# Patient Record
Sex: Male | Born: 1955 | Race: Black or African American | Hispanic: No | Smoking: Former smoker
Health system: Southern US, Community
[De-identification: ages and names within clinical notes are randomized; demographics above are authoritative.]

## PROBLEM LIST (undated history)

## (undated) DIAGNOSIS — M542 Cervicalgia: Secondary | ICD-10-CM

## (undated) DIAGNOSIS — M545 Low back pain: Secondary | ICD-10-CM

## (undated) DIAGNOSIS — J439 Emphysema, unspecified: Secondary | ICD-10-CM

## (undated) DIAGNOSIS — I2510 Atherosclerotic heart disease of native coronary artery without angina pectoris: Secondary | ICD-10-CM

## (undated) DIAGNOSIS — F329 Major depressive disorder, single episode, unspecified: Secondary | ICD-10-CM

## (undated) DIAGNOSIS — I700 Atherosclerosis of aorta: Secondary | ICD-10-CM

## (undated) DIAGNOSIS — K219 Gastro-esophageal reflux disease without esophagitis: Secondary | ICD-10-CM

## (undated) DIAGNOSIS — F32A Depression, unspecified: Secondary | ICD-10-CM

## (undated) DIAGNOSIS — G8929 Other chronic pain: Secondary | ICD-10-CM

## (undated) DIAGNOSIS — M109 Gout, unspecified: Secondary | ICD-10-CM

## (undated) DIAGNOSIS — R911 Solitary pulmonary nodule: Secondary | ICD-10-CM

## (undated) DIAGNOSIS — Z86010 Personal history of colonic polyps: Secondary | ICD-10-CM

## (undated) DIAGNOSIS — S129XXA Fracture of neck, unspecified, initial encounter: Secondary | ICD-10-CM

## (undated) HISTORY — DX: Atherosclerosis of aorta: I70.0

## (undated) HISTORY — DX: Gastro-esophageal reflux disease without esophagitis: K21.9

## (undated) HISTORY — DX: Major depressive disorder, single episode, unspecified: F32.9

## (undated) HISTORY — DX: Emphysema, unspecified: J43.9

## (undated) HISTORY — PX: NECK SURGERY: SHX720

## (undated) HISTORY — DX: Fracture of neck, unspecified, initial encounter: S12.9XXA

## (undated) HISTORY — DX: Solitary pulmonary nodule: R91.1

## (undated) HISTORY — DX: Atherosclerotic heart disease of native coronary artery without angina pectoris: I25.10

## (undated) HISTORY — DX: Depression, unspecified: F32.A

## (undated) HISTORY — DX: Cervicalgia: M54.2

## (undated) HISTORY — DX: Gout, unspecified: M10.9

## (undated) HISTORY — DX: Personal history of colonic polyps: Z86.010

## (undated) HISTORY — DX: Other chronic pain: G89.29

## (undated) HISTORY — DX: Low back pain: M54.5

---
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
## 1998-06-14 ENCOUNTER — Encounter: Admission: RE | Admit: 1998-06-14 | Discharge: 1998-09-01 | Payer: Self-pay | Admitting: *Deleted

## 1998-06-28 ENCOUNTER — Ambulatory Visit (HOSPITAL_COMMUNITY): Admission: RE | Admit: 1998-06-28 | Discharge: 1998-06-28 | Payer: Self-pay | Admitting: Gastroenterology

## 1999-04-04 ENCOUNTER — Encounter: Admission: RE | Admit: 1999-04-04 | Discharge: 1999-04-13 | Payer: Self-pay | Admitting: *Deleted

## 2003-02-06 DIAGNOSIS — G8929 Other chronic pain: Secondary | ICD-10-CM

## 2003-02-06 DIAGNOSIS — M545 Low back pain, unspecified: Secondary | ICD-10-CM

## 2003-02-06 DIAGNOSIS — S129XXA Fracture of neck, unspecified, initial encounter: Secondary | ICD-10-CM

## 2003-02-06 HISTORY — DX: Fracture of neck, unspecified, initial encounter: S12.9XXA

## 2003-02-06 HISTORY — PX: LUMBAR FUSION: SHX111

## 2003-02-06 HISTORY — DX: Other chronic pain: G89.29

## 2003-02-06 HISTORY — DX: Low back pain, unspecified: M54.50

## 2003-06-13 ENCOUNTER — Emergency Department (HOSPITAL_COMMUNITY): Admission: EM | Admit: 2003-06-13 | Discharge: 2003-06-13 | Payer: Self-pay | Admitting: Emergency Medicine

## 2003-07-01 ENCOUNTER — Inpatient Hospital Stay (HOSPITAL_COMMUNITY): Admission: EM | Admit: 2003-07-01 | Discharge: 2003-07-06 | Payer: Self-pay

## 2003-07-07 ENCOUNTER — Emergency Department (HOSPITAL_COMMUNITY): Admission: EM | Admit: 2003-07-07 | Discharge: 2003-07-07 | Payer: Self-pay | Admitting: Emergency Medicine

## 2003-07-28 ENCOUNTER — Emergency Department (HOSPITAL_COMMUNITY): Admission: EM | Admit: 2003-07-28 | Discharge: 2003-07-28 | Payer: Self-pay | Admitting: Emergency Medicine

## 2003-08-19 ENCOUNTER — Inpatient Hospital Stay (HOSPITAL_COMMUNITY): Admission: RE | Admit: 2003-08-19 | Discharge: 2003-08-24 | Payer: Self-pay | Admitting: Neurosurgery

## 2003-09-22 ENCOUNTER — Emergency Department (HOSPITAL_COMMUNITY): Admission: EM | Admit: 2003-09-22 | Discharge: 2003-09-22 | Payer: Self-pay | Admitting: Emergency Medicine

## 2004-01-19 ENCOUNTER — Ambulatory Visit: Payer: Self-pay | Admitting: Internal Medicine

## 2004-08-24 ENCOUNTER — Inpatient Hospital Stay (HOSPITAL_COMMUNITY): Admission: RE | Admit: 2004-08-24 | Discharge: 2004-08-26 | Payer: Self-pay | Admitting: Neurosurgery
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
## 2005-07-03 ENCOUNTER — Ambulatory Visit (HOSPITAL_COMMUNITY): Admission: RE | Admit: 2005-07-03 | Discharge: 2005-07-03 | Payer: Self-pay | Admitting: Neurosurgery

## 2005-09-03 ENCOUNTER — Inpatient Hospital Stay (HOSPITAL_COMMUNITY): Admission: RE | Admit: 2005-09-03 | Discharge: 2005-09-11 | Payer: Self-pay | Admitting: Neurosurgery

## 2006-11-20 ENCOUNTER — Encounter
Admission: RE | Admit: 2006-11-20 | Discharge: 2007-02-18 | Payer: Self-pay | Admitting: Physical Medicine & Rehabilitation

## 2006-11-20 ENCOUNTER — Ambulatory Visit: Payer: Self-pay | Admitting: Physical Medicine & Rehabilitation

## 2006-12-25 ENCOUNTER — Ambulatory Visit: Payer: Self-pay | Admitting: Physical Medicine & Rehabilitation

## 2006-12-27 ENCOUNTER — Encounter
Admission: RE | Admit: 2006-12-27 | Discharge: 2007-02-05 | Payer: Self-pay | Admitting: Physical Medicine & Rehabilitation

## 2007-01-15 ENCOUNTER — Ambulatory Visit: Payer: Self-pay | Admitting: Physical Medicine & Rehabilitation

## 2007-01-20 ENCOUNTER — Encounter: Admission: RE | Admit: 2007-01-20 | Discharge: 2007-04-20 | Payer: Self-pay | Admitting: Anesthesiology

## 2007-01-21 ENCOUNTER — Ambulatory Visit: Payer: Self-pay | Admitting: Anesthesiology

## 2007-02-14 ENCOUNTER — Encounter
Admission: RE | Admit: 2007-02-14 | Discharge: 2007-03-17 | Payer: Self-pay | Admitting: Physical Medicine & Rehabilitation

## 2007-02-26 ENCOUNTER — Encounter
Admission: RE | Admit: 2007-02-26 | Discharge: 2007-05-27 | Payer: Self-pay | Admitting: Physical Medicine & Rehabilitation

## 2007-02-26 ENCOUNTER — Ambulatory Visit: Payer: Self-pay | Admitting: Physical Medicine & Rehabilitation

## 2007-04-09 ENCOUNTER — Ambulatory Visit: Payer: Self-pay | Admitting: Psychology

## 2008-02-06 DIAGNOSIS — Z860100 Personal history of colon polyps, unspecified: Secondary | ICD-10-CM

## 2008-02-06 HISTORY — DX: Personal history of colonic polyps: Z86.010

## 2008-02-06 HISTORY — DX: Personal history of colon polyps, unspecified: Z86.0100

## 2008-04-05 HISTORY — PX: COLONOSCOPY: SHX174

## 2009-11-02 ENCOUNTER — Encounter: Admission: RE | Admit: 2009-11-02 | Discharge: 2009-11-02 | Payer: Self-pay | Admitting: Orthopedic Surgery

## 2010-06-20 NOTE — Procedures (Signed)
NAME:  Rodney Charles, Rodney Charles NO.:  [id number]   MEDICAL RECORD NO.:  [id number]          PATIENT TYPE:  REC   LOCATION:  [facility]                          FACILITY:  [facility]   PHYSICIAN:  Celene Kras, MD        DATE OF BIRTH:  [birth year]   DATE OF PROCEDURE:  DATE OF DISCHARGE:                               OPERATIVE REPORT   Rodney Charles comes in for pain management today.  __________  14 point  review of systems.   1. Rodney Charles has extensive hardware, added biomechanical stress above and      below surgical fixation site, described as a left lateralizing      pain, suprascapular, levator scapular, myofascial extension.      Decreased range of motion and quality of life indices, he does not      want to escalate controlled substances and wants to improve      function.  We have injected him, he has done well with cervical      facet medial branch intervention with attenuated symptoms, and      recrudescence.  He had about a 48-hour total relief cycling, and      today we will go ahead and sequentially inject him.  We will look      for a biphasic response to local anesthetic and possibly steroid,      and I have reviewed this with him.  I have used models and      discussed in lay terms.  2. He will assess this.  We may sequentially go on to Marcaine, but I      am going to stick with lidocaine today, as he had a little bit of      dizziness with last intervention, and then follow him expectantly.      I plan C3, C4, C5 and C6 contributory innervation addressed under      local anesthetic.  Predicate further intervention based on need and      overall response, left side, independent needle access points at      the medial branch.  3. Home-based therapy.  4. Low back symptoms are stable.  5. No advancing neurological features.  Do not think further imaging      or diagnostics are warranted.  He will maintain contact with Dr.      Wynn Charles office.   Objectively, diffuse  paracervical, suprascapular, and myofascial,  positive  compression test, left greater than right.  This is his  typical pain mechanical over diskogenic with pain side bending, pain  with extension and flexion.  Rotational impairment.  Nothing new  neurologically.   IMPRESSION:  Cervical facet syndrome __________  cervical spine.   PLAN:  Cervical facet medial branch intervention, C3, C4, C5 and C6 with  contributory innervation addressed to the left side under local  anesthetic, and he is consented.  Predicate further intervention based  on need and overall response.  Questions are answered and discussed in  lay terms.  Follow up with him and determine further course of care.   The patient  taken to fluoroscopy suite and placed in supine position and  prepped and draped in usual fashion.  Prepped and draped the neck in  usual fashion.  Under local anesthetic, I advanced a 25-gauge spinal  needle to the facet of the medial branch of C3, C4, C5 and C6 with  contributory innervation addressed at independent needle access points.  I confirmed placement.  I then test uneventfully and followed 0.5 mL  lidocaine and 1% MPF at each level, with a total of 40 mg Aristocort in  divided dose.   Tolerated procedure well.  No complications from our procedure.  Improved at discharge.  Predicate further intervention based on need.  At some point, sequential intervention with denser local anesthetic, and  would consider  RF down the road.  I do not think it is necessary at this time.  We want  to see a real good response here, and follow him expectantly.           ______________________________  Celene Kras, MD     HH/MEDQ  D:  02/25/2007 12:36:57  T:  02/25/2007 17:28:33  Job:  [id number]
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
## 2010-06-20 NOTE — Assessment & Plan Note (Signed)
Mr. Otting returns today.  I last saw him January 17, 2007.  He  underwent L1, L2, L3 medial branch blocks under fluoroscopic guidance.  The first set of injections helped him 40-50%, this last one about 20%  improvement.  In the interval time he did see Dr. Stevphen Rochester, but was not  able to do the injection secondary to mix up in Workers' Comp approval  process.  Other injection February 25, 2007.  He has had no new medical  complications in the interval time period.   His medications include hydrocodone 5/500 one p.o. b.i.d.  He still has  some gabapentin 300 mg q.i.d. from Dr. Phoebe Perch and he continues to take  that.  He also has been taking Flexeril 10 mg t.i.d.  His other meds  include Nexium and Flomax.   EXAMINATION:  No acute distress.  When he moves, he is very apprehensive  about moving.  He grimaces a lot, even with very slight movements.  He  complains of back pain when I do a hip range of motion.  He complains of  neck pain when I examine his arms.  He has full range of motion in upper  and lower extremities.  He has normal strength bilateral upper and lower  extremities.  His neck range of motion is extremely limited, 25%  flexion/extension, essentially 0 lateral rotation and bending.  He does  not turn the head either.  He pushes up his chin intermittently with his  hand.  His gait shows no evidence of toe drag or knee instability.  No  coordination problems in the upper or lower extremities.   IMPRESSION:  1. Chronic postoperative neck pain.  He is status post anterior      cervical diskectomy and fusion C4-5, 5-6, 6-7.  2. Lumbar postlaminectomy syndrome with chronic postoperative pain      underwent L4-5, L5-S1 fusion.  3. Back pain and buttocks pain.  Given no significant response to      lumbar medial branch blocks, we will pursue sacroiliac injections.  4. Cervical medial branch block is already scheduled per Dr. Stevphen Rochester.  5. Continue current medication, hydrocodone  5/500 one p.o. b.i.d.  6. Continue treatment from psychology, Dr. Leonides Cave, for depression and      anxiety.   I will see him back for the SI injection.   ADDENDUM:  I spoke with Omelia Blackwater, case manager.  Discussed treatment  plans with her and the patient, all in agreement with above.  Will also  give samples of Flexor Patch b.i.d.  If helpful, the patient will call  in for a prescription.  I have also written for Flexeril 10 mg, number  90, but he has been instructed to really take this only as needed and  not necessarily on a daily basis.      Erick Colace, M.D.  Electronically Signed     AEK/MedQ  D:  02/17/2007 15:32:21  T:  02/18/2007 09:08:04  Job #:  [id number]   cc:   Clydene Fake, M.D.  Fax: [phone]   Omelia Blackwater  Fax [phone]
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
## 2010-06-20 NOTE — Procedures (Signed)
NAME:  Rodney Charles, Rodney Charles               ACCOUNT NO.:  [id number]   MEDICAL RECORD NO.:  [id number]          PATIENT TYPE:  REC   LOCATION:  [facility]                         FACILITY:  [facility]   PHYSICIAN:  Erick Colace, M.D.DATE OF BIRTH:  04-12-1955   DATE OF PROCEDURE:  DATE OF DISCHARGE:                               OPERATIVE REPORT   PROCEDURE:  Bilateral L1 medial branch block, bilateral L2 medial branch  block, bilateral L3 medial branch under block fluoroscopic guidance.   INDICATIONS:  Lumbar facet mediated pain above of fusion at L4-5, L5-S1  level.   Pain is only partially responsive to medication management including  narcotic analgesics as other conservative care as well.  Pain interferes  with bathing, household duties, shopping, dressing and working.   Informed consent was obtained after describing risks and benefits of the  procedure to the patient.  These include bleeding, bruising, infection,  temporary or permanent paralysis.  He elects to proceed and has given  written consent.  The patient placed prone on fluoroscopy table.  Betadine prep, sterile drape, 25-gauge 1-1/2 inch needle was used to  anesthetize skin and subcu tissue 1% lidocaine x2 mL.  Then a 22 gauge 3-  1/2 inch spinal needle was inserted first targeting left left L4 SAP  transverse process junction, bone contact made confirmed with lateral  imaging.  Omnipaque 180 x 0.5 mL demonstrated no intravascular uptake,  then solution containing 9.5 mL of solution containing 1 mL of 4 mg per  mL dexamethasone 2 mL of 2% MPF lidocaine were injected.  The left L3  SAP transverse process junction targeted bone contact confirmed made  with lateral imaging.  Omnipaque 180 x 0.5 mL demonstrated no  intravascular uptake and 0.5 mL of dexamethasone lidocaine solution  injected.  The left L2 SAP transverse junction targeted bone contact  made confirmed with lateral imaging.  Omnipaque 180 x 0.5 mL  demonstrated no  intravascular uptake and 0.5 mL of dexamethasone  lidocaine solution was injected.  The same procedure was repeated on the  right side at corresponding levels using same injectate same procedure  and same technique.  The patient tolerated procedure well.  Pre post  injection vitals stable.  Post injection instructions given.  Return in  10 to 14 days for recheck, see how he did with this and decide whether  not to pursue radiofrequency neurotomy.      Erick Colace, M.D.  Electronically Signed     AEK/MEDQ  D:  01/16/2007 13:10:45  T:  01/17/2007 08:01:12  Job:  [id number]

## 2010-06-20 NOTE — Assessment & Plan Note (Signed)
The patient is here for a sacroiliac injection but has other questions  that he wants to discuss.  He states that he has had a decline in sexual  interest as well as difficulty with having intercourse due to pain in  his back.  He has been speaking with Dr. Leonides Cave from Psychology, who  suggested I might be able to discuss some other positions that might  more comfortable for the patient and to address his other decreased  libido issue.   He is known to have no history of diabetes.  He has been on Flomax for  prostatic hypertrophy.   CURRENT MEDICATIONS:  1. Hydrocodone 5/500 b.i.d.  2. Flexeril 10 mg q.8h.   Sleep is poor.  Pain interferes with activity at only a 2 out of 10  level.  Average pain is 10 out of 10.  Can walk 20 minutes at a time,  climb steps, and drive.   REVIEW OF SYSTEMS:  See 14-point review on Health and History form.  Pertinent positives are numbness, tremor, tingling, spasms, depression,  and anxiety.   PHYSICAL EXAMINATION:  GENERAL:  In no acute distress, mood and affect  are appropriate.  VITAL SIGNS:  Blood pressure is 125/95, pulse is 61, respiratory rate is  18, O2 SAT 99% room air.  PAIN AND REHAB EVALUATION:  His gait is normal without toe drag or knee  instability.  Mood and affect are appropriate.   I suggested to the patient that he try the bottom position with his  significant other on top.  This would reduce the pain related to  lumbosacral motion.  I also indicated that narcotic analgesics can  reduce testosterone level especially if taken on a chronic basis and  that the best way to reduce that would be to reduce his medication  usage.   He also handed me some disability forms that he wants me to complete as  well as a permanent handicap parking sticker.  I stated that I do not  think he needs a permanent sticker and that I would be willing to give  him a temporary one x1 time only.  As I stated before, I felt the  previous restrictions  noted on his FCE indicating a  standing tolerance  of one to two hours and walking tolerance of two to three hours in an  eight hour day with an overall sedentary select duty position is  appropriate, and I will fill out his forms keeping this in mind.  I will  see him back in two weeks.  We will discuss his treatment including  injections from Dr. Stevphen Rochester as well as the injections I have done and  determine whether he is at MMI from a pain management standpoint.      Erick Colace, M.D.  Electronically Signed     AEK/MedQ  D:  02/27/2007 17:25:16  T:  02/27/2007 23:12:06  Job #:  [id number]

## 2010-06-20 NOTE — Procedures (Signed)
NAME:  Rodney Charles, Rodney Charles               ACCOUNT NO.:  [id number]   MEDICAL RECORD NO.:  [id number]          PATIENT TYPE:  REC   LOCATION:  [facility]                          FACILITY:  [facility]   PHYSICIAN:  Erick Colace, M.D.DATE OF BIRTH:  Jun 09, 1955   DATE OF PROCEDURE:  02/27/2007  DATE OF DISCHARGE:                               OPERATIVE REPORT   PROCEDURE:  Bilateral sacroiliac injection under fluoroscopic guidance.   INDICATION:  Lumbar pain, status post L4-5, L5-S1 fusion not responsive  to lumbar medial branch blocks.  He has pain in the low back and buttock  area as well.   Pain is only partially responsive to medication management.   MEDICATIONS:  1. Hydrocodone 5/5.01 p.o. b.i.d.  2. Flexeril time q.8h. p.r.n.   Informed consent was obtained after describing risks and benefits of the  procedure to the patient.  These include bleeding, bruising, infection,  loss of bowel or bladder function, temporary or permanent paralysis.  He  elects to proceed with has given written consent.   The patient was placed prone on the fluoroscopy table.  Betadine prep,  sterile drape, a 25-gauge inch and a half needle was used to anesthetize  skin and subcu tissue 1% lidocaine x2 mL.  Then a 25-gauge three inch  spinal needle was inserted first in the left SI joint. AP, lateral and  oblique imaging utilized.  Omnipaque 20 x0.5 mL demonstrated no  intravascular uptake.  Then a solution containing 0.5 mL of 40 mg/mL  Depo-Medrol and 1 mL of 2% MPF lidocaine were injected.  The same  procedure was repeated on the right side at corresponding level with  same needle, injectate technique.  The patient tolerated procedure well.  Pre- and post injection vitals stable.  Pre-injection pain level 10/10,  post injection 7/10.  I will see him back in two weeks for follow-up  visit and go over his response to treatment thus far.      Erick Colace, M.D.  Electronically Signed     AEK/MEDQ   D:  02/27/2007 17:19:52  T:  02/28/2007 08:28:36  Job:  [id number]   cc:   Clydene Fake, M.D.  Fax: [phone]   Nestor Ramp FAX [phone], Med. Nurse Case Man.

## 2010-06-20 NOTE — Assessment & Plan Note (Signed)
ADDENDUM:  I put a procedure down as cervical facet medial branch  intervention.  I did not do that procedure.  Just prior to starting to  the procedure, we found he was not preapproved by [employer] and  withheld and reviewed his medication.  I talked to Dr. Wynn Banker.  We  will see him in follow up.  Cervical facet medial branch intervention in  follow up.           ______________________________  Celene Kras, MD     HH/MedQ  D:  01/21/2007 14:29:53  T:  01/22/2007 06:09:26  Job #:  [id number]

## 2010-06-20 NOTE — Procedures (Signed)
Rodney Charles, Rodney Charles               ACCOUNT NO.:  [id number]   MEDICAL RECORD NO.:  [id number]          PATIENT TYPE:  REC   LOCATION:  [facility]                          FACILITY:  [facility]   PHYSICIAN:  Erick Colace, M.D.DATE OF BIRTH:  1955-09-24   DATE OF PROCEDURE:  12/26/2006  DATE OF DISCHARGE:                               OPERATIVE REPORT   This is bilateral L5 dorsal ramus injection, bilateral L4 medial branch  block, bilateral L3 medial branch block under fluoroscopic guidance.   INDICATIONS:  Lumbar facet mediated pain only partially responsive to  medication management.  He has had L4-S1 fusion and has pain above the  level of the fusion.  Axial back pain above the level.   He been trialed on Flexeril, Arthrotec, gabapentin, as well as tramadol  and has gone through physical therapy.   Pain is inhibiting from functional activities and including dressing  bathing.   Informed consent was obtained after describing risks and benefits of the  procedure to the patient.  These include bleeding, bruising, infection,  temporary or permanent paralysis.  He has elected to proceed and has  given written consent.  The patient placed prone on fluoroscopy table.  Betadine prepped and sterile drape.  25-gauge 1-1/2 inch needle was used  to anesthetize skin and subcu tissue with 1% lidocaine x2 ccs and 22-  gauge 3-1/2 inch spinal needle was inserted under fluoroscopic guidance  first starting the left L4 SAP transverse junction targeted junction.  Bone contact made, confirmed with lateral imaging.  Bone contact made  confirmed with lateral imaging.  Omnipaque 180 x 0.5 mL demonstrated no  intravascular uptake and 0.5 mL of solution containing 1 mL of 4 mg per  mL dexamethasone and 2 mL of 2% MPF lidocaine.  Then left L3 SAP  transverse process junction targeted, bone contact made.  Omnipaque 180  x 0.5 mL demonstrated no intravascular uptake and 0.5 mL of  dexamethasone lidocaine  solution was injected.  Then the left L2 SAP  transverse process junction targeted.  Bone contact made, confirmed with  lateral imaging.  Omnipaque 180 x 0.5 mL demonstrated no intravascular  uptake and 0.5 mL of dexamethasone lidocaine solution was injected.  Then the right side injections were performed at the corresponding  levels using the same equipment injectate and technique.  The patient  tolerated procedure well.  Pre and post injection vitals stable.  Pre  injection pain level 10/10, post injection 9/10.  We will give him a  pain diary and have him come back in three weeks.  If he did not get the  least a 50% pain relief, would do sacroiliac injections.      Erick Colace, M.D.  Electronically Signed     AEK/MEDQ  D:  12/26/2006 14:59:55  T:  12/27/2006 07:12:55  Job:  [id number]

## 2010-06-23 NOTE — Discharge Summary (Signed)
NAME:  Rodney Charles, Rodney Charles               ACCOUNT NO.:  [id number]   MEDICAL RECORD NO.:  [id number]          PATIENT TYPE:  INP   LOCATION:  3016                         FACILITY:  [facility]   PHYSICIAN:  Clydene Fake, M.D.  DATE OF BIRTH:  1955-02-18   DATE OF ADMISSION:  09/03/2005  DATE OF DISCHARGE:  09/11/2005                                 DISCHARGE SUMMARY   DATE OF ADMISSION:  September 03, 2005   DATE OF DISCHARGE:  September 11, 2005   DIAGNOSIS:  Lumbar stenosis, spinal stenosis, degenerative disk disease,  herniated nucleus pulposus L4-5 and L5-S1.   DISCHARGE DIAGNOSIS:  Lumbar stenosis, spinal stenosis, degenerative disk  disease, herniated nucleus pulposus L4-5 and L5-S1.   PROCEDURE:  Posterior lumbar interbody fusion L4-5 and L5-S1, Saber  interbody cages, Expedium pedicle screw fixation, posterolateral fusion,  autografting Infuse BMP.   REASON FOR ADMISSION:  The patient is a 55 year old gentleman who has had  back and leg pain.  __________ MRI, x-rays, and myelogram.  The patient had  degenerative changes, disk herniation, and root compression.  The patient  brought in for decompression and infusion.   HOSPITAL COURSE:  The patient admitted the day of surgery and underwent the  procedure above without complication.  Postop the patient was transferred to  the recovery room and then to the floor.  He was moving his legs well while  postop had much less leg pain, was up ambulating.  He did have some  significant incisional pain.  The patient had some slight drainage on the  dressing and we started to cover him with p.o. Cipro the second postop day  as his IV antibiotics were stopped.  He was also intermittent febrile  through these first few days.  By September 07, 2005, he had a temperature of  102.8, white count was just up a little bit at 12.8.  Incision was  indurated, warm, tender, and had some drainage that did look purulent.  It  was decided to take the patient back to  the operating room for I&D wound and  on September 07, 2005, the patient had I&D of lumbar wound.  Gram stain was  negative.  Surgery went well and he was transferred to recovery room and  back to the floor.  We did dressing changes for his drainage which stopped  and covered him with IV antibiotics with vancomycin from the time of I&D of  the wound, and cultures were negative through his hospital stay.  By September 11, 2005, he had no drainage, incision looked good, cultures were all  negative.  It was thought it was just a stroma reaction rather than  infection.  We did cover him with p.o. Cipro and sent him home on September 11, 2005, in stable condition with Percocet p.r.n., Flexeril p.r.n., Cipro for  the next 10 days and will follow up in 1 week in our office for wound check  and suture removal.           ______________________________  Clydene Fake, M.D.     JRH/MEDQ  D:  10/25/2005  T:  10/25/2005  Job:  [id number]
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
## 2010-06-23 NOTE — Op Note (Signed)
NAMEMarland Charles  SANJIV, CASTORENA NO.:  [id number]   MEDICAL RECORD NO.:  [id number]          PATIENT TYPE:  INP   LOCATION:  3016                         FACILITY:  [facility]   PHYSICIAN:  Clydene Fake, M.D.  DATE OF BIRTH:  1955-07-18   DATE OF PROCEDURE:  09/07/2005  DATE OF DISCHARGE:                                 OPERATIVE REPORT   DIAGNOSIS:  Possible wound infection, lumbar wound.   POSTOPERATIVE DIAGNOSES:  1.  Possible wound infection, lumbar wound.  2.  Subcutaneous hematoma.   SURGEON:  Clydene Fake, M.D.   ANESTHESIA:  General endotracheal tube anesthesia.   ESTIMATED BLOOD LOSS:  Minimal.   BLOOD GIVEN:  None.   DRAINS:  None.   COMPLICATIONS:  None.   CULTURES SENT:  Gram stain was negative for organisms.   REASON FOR PROCEDURE:  The patient is a 55 year old gentleman who 4 days ago  underwent interbody spinal fusion with screw instrumentation and interbody  cage at L4-5 and 5-1.  He has had some increasing wound drainage, increased  back pain, some induration and warmth around the incision, spiking  temperatures up to 102.8 and white count was high, and it was elected to  return the patient to operating room for I&D of wound for possible  infection.   PROCEDURE IN DETAIL:  The patient was brought to the operating room, and  general anesthesia was induced.  The patient was placed in the prone  position on the Wilson frame.  All pressure points were padded.  The patient  was prepped and draped in the usual sterile fashion, and the site of the  previous incision was opened with a 15 blade.  Immediately serosanguineous  fluid was found along with some clumped hematoma in the subcutaneous space.  Cultures for aerobe, anaerobe, and Gram stain were sent, and hematoma fluid  evacuated.  Explored the __________ , and there was no purulent material.  It actually looked very good.  The fascial suture line looked good.  We  pulse evac'd with 3 liters of  vancomycin.  We then again explored the area.  We explored the fascial suture line.  Some fluid did come up from there, so  we opened up the middle half of the fascial suture line to explore the  epidural space.  Hematoma over the dura was seen along with some  serosanguineous fluid.  This was all evacuated, and we saw the dura, and  with Valsalva no further fluid build-up came.  No sign of CSF leak.  We  irrigated with bacitracin irrigation.  No sign of purulent material in this  area, and then the fascia was reclosed with 0-Vicryl interrupted  suture.  The subcutaneous tissue was closed with just two 0-Vicryl  interrupted sutures, bringing the incision together and the skin closed with  2-0 nylon running suture.  Dressing was placed.  The patient was placed back  in the supine position, awoken from anesthesia, and returned to the recovery  room in stable condition.           ______________________________  Dola Argyle  Phoebe Perch, M.D.     JRH/MEDQ  D:  09/07/2005  T:  09/08/2005  Job:  [id number]

## 2010-09-14 ENCOUNTER — Emergency Department (HOSPITAL_COMMUNITY)
Admission: EM | Admit: 2010-09-14 | Discharge: 2010-09-14 | Disposition: A | Discharge status: Home / Self Care | Payer: Medicare Other | Attending: Emergency Medicine | Admitting: Emergency Medicine

## 2010-09-14 ENCOUNTER — Encounter (HOSPITAL_COMMUNITY): Payer: Self-pay | Admitting: Radiology

## 2010-09-14 ENCOUNTER — Emergency Department (HOSPITAL_COMMUNITY): Payer: Medicare Other

## 2010-09-14 DIAGNOSIS — M546 Pain in thoracic spine: Secondary | ICD-10-CM | POA: Insufficient documentation

## 2010-09-14 DIAGNOSIS — G8929 Other chronic pain: Secondary | ICD-10-CM | POA: Insufficient documentation

## 2010-09-14 DIAGNOSIS — R0789 Other chest pain: Secondary | ICD-10-CM | POA: Insufficient documentation

## 2010-09-14 DIAGNOSIS — M542 Cervicalgia: Secondary | ICD-10-CM | POA: Insufficient documentation

## 2010-09-14 LAB — BASIC METABOLIC PANEL
BUN: 8 mg/dL (ref 6–23)
Creatinine, Ser: 0.87 mg/dL (ref 0.50–1.35)
GFR calc Af Amer: >60 mL/min (ref >60)
GFR calc non Af Amer: >60 mL/min (ref >60)
Glucose, Bld: 101 mg/dL — ABNORMAL HIGH (ref 70–99)

## 2010-09-14 MED ORDER — IOHEXOL 350 MG/ML SOLN
80.0000 mL | Freq: Once | INTRAVENOUS | Status: AC | PRN
  Administered 2010-09-14: 80 mL via INTRAVENOUS

## 2010-09-21 ENCOUNTER — Encounter: Payer: Self-pay | Admitting: Cardiovascular Disease

## 2010-09-21 ENCOUNTER — Encounter: Payer: Self-pay | Admitting: *Deleted

## 2010-09-22 ENCOUNTER — Encounter: Payer: Self-pay | Admitting: Cardiovascular Disease

## 2010-09-22 ENCOUNTER — Ambulatory Visit (INDEPENDENT_AMBULATORY_CARE_PROVIDER_SITE_OTHER): Payer: PRIVATE HEALTH INSURANCE | Admitting: Cardiovascular Disease

## 2010-09-22 DIAGNOSIS — R079 Chest pain, unspecified: Secondary | ICD-10-CM | POA: Insufficient documentation

## 2010-09-22 NOTE — Progress Notes (Signed)
History of Present Illness:54 yo AAM with history of GERD, tobacco abuse here today for cardiac evaluation. He tells me that he has had discomfort in his chest last week. Sharp stabbing pain made worse by movement. He has no exertional chest pain or pressure. NO associated SOB, nausea, diaphoresis, dizziness.  The pain in his chest has been constant for 8 days but is much better. No prior cardiac history. Seen in [facility] ED 09/14/10 with this chest pain and had negative enzmes, no ekg changes. Coronary CTA was done and showed mild CAD with no obstructive lesions.   Past Medical History  Diagnosis Date  . GERD (gastroesophageal reflux disease)   . Neck pain   . Chest pain     Past Surgical History  Procedure Date  . Neck surgery   . Back surgery     Current Outpatient Prescriptions  Medication Sig Dispense Refill  . esomeprazole (NEXIUM) 10 MG packet Take 10 mg by mouth daily before breakfast.        . gabapentin (NEURONTIN) 300 MG capsule Take 300 mg by mouth 2 (two) times daily.        . Ibuprofen (ADVIL PO) Take 1 tablet by mouth as needed.          No Known Allergies  History   Social History  . Marital Status: Divorced    Spouse Name: N/A    Number of Children: N/A  . Years of Education: N/A   Occupational History  . Not on file.   Social History Main Topics  . Smoking status: Current Everyday Smoker    Types: Cigarettes  . Smokeless tobacco: Not on file  . Alcohol Use: Not on file  . Drug Use: Not on file  . Sexually Active: Not on file   Other Topics Concern  . Not on file   Social History Narrative  . No narrative on file    No family history on file.  Review of Systems:  As stated in the HPI and otherwise negative.   BP 129/87  Pulse 61  Ht 6\' 2"  (1.88 m)  Wt 217 lb (98.431 kg)  BMI 27.86 kg/m2  Physical Examination: General: Well developed, well nourished, NAD HEENT: OP clear, mucus membranes moist SKIN: warm, dry. No rashes. Neuro: No focal  deficits Musculoskeletal: Muscle strength 5/5 all ext Psychiatric: Mood and affect normal Neck: No JVD, no carotid bruits, no thyromegaly, no lymphadenopathy. Lungs:Clear bilaterally, no wheezes, rhonci, crackles Cardiovascular: Regular rate and rhythm. No murmurs, gallops or rubs. Abdomen:Soft. Bowel sounds present. Non-tender.  Extremities: No lower extremity edema. Pulses are 2 + in the bilateral DP/PT.  EKG:NSR, rate 61 bpm. Non-specific T wave changes.

## 2010-09-22 NOTE — Patient Instructions (Signed)
Your physician has requested that you have an exercise tolerance test. For further information please visit [email or web address] Please also follow instruction sheet, as given with either Dr. Clifton James or Tereso Newcomer, PA.

## 2010-09-22 NOTE — Assessment & Plan Note (Addendum)
Atypical chest pain. His risk factor for CAD is tobacco abuse and FH of CAD. Coronary CTA with mild CAD. This seems to be musculoskeleta chest painl. Given risk factors, will arrange exercise treadmill stress test to assess his exercise capacity and exclude ischemia.

## 2010-10-07 HISTORY — PX: CARDIOVASCULAR STRESS TEST: SHX262

## 2010-10-17 ENCOUNTER — Ambulatory Visit (INDEPENDENT_AMBULATORY_CARE_PROVIDER_SITE_OTHER): Payer: PRIVATE HEALTH INSURANCE | Admitting: Cardiovascular Disease

## 2010-10-17 DIAGNOSIS — R079 Chest pain, unspecified: Secondary | ICD-10-CM

## 2010-10-17 NOTE — Progress Notes (Signed)
Exercise Treadmill Test  Pre-Exercise Testing Evaluation Rhythm: normal sinus  Rate: 64   PR:  .18 QRS:  .07  QT:  .38 QTc: .39     Test  Exercise Tolerance Test Ordering MD: Melene Muller, MD  Interpreting MD:  Melene Muller, MD  Unique Test No: 1  Treadmill:  1  Indication for ETT: chest pain - rule out ischemia  Contraindication to ETT: No   Stress Modality: exercise - treadmill  Cardiac Imaging Performed: non   Protocol: standard Bruce - maximal  Max BP:144/76  Max MPHR (bpm):  168 85% MPR (bpm):  142  MPHR obtained (bpm):  148 % MPHR obtained:  88  Reached 85% MPHR (min:sec):  7:40 Total Exercise Time (min-sec):  8:00  Workload in METS:  10.1 Borg Scale: 16  Reason ETT Terminated:  fatigue    ST Segment Analysis At Rest: normal ST segments - no evidence of significant ST depression With Exercise: no evidence of significant ST depression  Other Information Arrhythmia:  No Angina during ETT:  absent (0) Quality of ETT:  non-diagnostic  ETT Interpretation:  normal - no evidence of ischemia by ST analysis  Comments: Moderate exercise tolerance. No ischemic EKG changes or chest pain with exercise. No evidence of ischemia. Normal stress test.   Recommendations: Daily exercise. No further cardiac workup at this time.

## 2011-10-19 ENCOUNTER — Other Ambulatory Visit: Payer: Self-pay | Admitting: Gastroenterology

## 2011-10-19 DIAGNOSIS — R1310 Dysphagia, unspecified: Secondary | ICD-10-CM

## 2011-10-26 ENCOUNTER — Ambulatory Visit
Admission: RE | Admit: 2011-10-26 | Discharge: 2011-10-26 | Disposition: A | Discharge status: Home / Self Care | Payer: PRIVATE HEALTH INSURANCE | Source: Ambulatory Visit | Attending: Gastroenterology | Admitting: Gastroenterology

## 2011-10-26 DIAGNOSIS — R1310 Dysphagia, unspecified: Secondary | ICD-10-CM

## 2012-08-05 ENCOUNTER — Ambulatory Visit (INDEPENDENT_AMBULATORY_CARE_PROVIDER_SITE_OTHER): Payer: Medicare Other | Admitting: Family Medicine

## 2012-08-05 ENCOUNTER — Encounter: Payer: Self-pay | Admitting: Family Medicine

## 2012-08-05 VITALS — BP 122/78 | HR 80 | Temp 98.3°F | Ht 74.0 in | Wt 227.5 lb

## 2012-08-05 DIAGNOSIS — F329 Major depressive disorder, single episode, unspecified: Secondary | ICD-10-CM

## 2012-08-05 DIAGNOSIS — M542 Cervicalgia: Secondary | ICD-10-CM

## 2012-08-05 DIAGNOSIS — G8929 Other chronic pain: Secondary | ICD-10-CM | POA: Insufficient documentation

## 2012-08-05 DIAGNOSIS — M545 Low back pain: Secondary | ICD-10-CM

## 2012-08-05 DIAGNOSIS — K219 Gastro-esophageal reflux disease without esophagitis: Secondary | ICD-10-CM

## 2012-08-05 DIAGNOSIS — F39 Unspecified mood [affective] disorder: Secondary | ICD-10-CM | POA: Insufficient documentation

## 2012-08-05 MED ORDER — TRAMADOL HCL 50 MG PO TABS: 50.0000 mg | ORAL_TABLET | Freq: Two times a day (BID) | ORAL | Status: DC | PRN

## 2012-08-05 NOTE — Assessment & Plan Note (Addendum)
Await records from prior PCP as well as pain and NSG.

## 2012-08-05 NOTE — Assessment & Plan Note (Signed)
Stable on nexium - continued. Discussed avoiding NSAIDs
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
## 2012-08-05 NOTE — Assessment & Plan Note (Addendum)
Anticipate largely stemming from poor pain control. PHQ9 today = 23/27, extremely difficult to function. Start tramadol, reassess at CPE next month.  Pt agrees with plan. No SI/HI.
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
## 2012-08-05 NOTE — Patient Instructions (Addendum)
We will request records from Dr. Loreta Ave, Dr. Christella Hartigan, and Drs Wynn Banker and Vanoss. Return in 1-2 months for physical as you're due.  Return fasting for blood work prior. Good to meet you today, call us with questions. May try tramadol for pain - 50mg  up to twice daily as needed.

## 2012-09-01 ENCOUNTER — Other Ambulatory Visit: Payer: Self-pay | Admitting: Family Medicine

## 2012-09-01 DIAGNOSIS — E663 Overweight: Secondary | ICD-10-CM

## 2012-09-01 DIAGNOSIS — Z0000 Encounter for general adult medical examination without abnormal findings: Secondary | ICD-10-CM

## 2012-09-02 ENCOUNTER — Other Ambulatory Visit (INDEPENDENT_AMBULATORY_CARE_PROVIDER_SITE_OTHER): Payer: Medicare Other

## 2012-09-02 DIAGNOSIS — E663 Overweight: Secondary | ICD-10-CM

## 2012-09-02 DIAGNOSIS — Z0000 Encounter for general adult medical examination without abnormal findings: Secondary | ICD-10-CM

## 2012-09-02 DIAGNOSIS — K219 Gastro-esophageal reflux disease without esophagitis: Secondary | ICD-10-CM

## 2012-09-02 LAB — COMPREHENSIVE METABOLIC PANEL
AST: 21 U/L (ref 0–37)
Albumin: 4 g/dL (ref 3.5–5.2)
Alkaline Phosphatase: 77 U/L (ref 39–117)
BUN: 11 mg/dL (ref 6–23)
Calcium: 9.4 mg/dL (ref 8.4–10.5)
Chloride: 107 mEq/L (ref 96–112)
Glucose, Bld: 82 mg/dL (ref 70–99)
Potassium: 3.7 mEq/L (ref 3.5–5.1)
Sodium: 140 mEq/L (ref 135–145)
Total Protein: 7.5 g/dL (ref 6.0–8.3)

## 2012-09-02 LAB — LIPID PANEL: VLDL: 20.6 mg/dL (ref 0.0–40.0)

## 2012-09-02 LAB — LDL CHOLESTEROL, DIRECT: Direct LDL: 144.9 mg/dL

## 2012-09-07 ENCOUNTER — Encounter: Payer: Self-pay | Admitting: Family Medicine

## 2012-09-09 ENCOUNTER — Encounter: Payer: Self-pay | Admitting: Family Medicine

## 2012-09-09 ENCOUNTER — Ambulatory Visit (INDEPENDENT_AMBULATORY_CARE_PROVIDER_SITE_OTHER): Payer: Medicare Other | Admitting: Family Medicine

## 2012-09-09 ENCOUNTER — Encounter: Payer: Self-pay | Admitting: *Deleted

## 2012-09-09 VITALS — BP 102/70 | HR 68 | Temp 98.3°F | Ht 74.0 in | Wt 228.2 lb

## 2012-09-09 DIAGNOSIS — G8929 Other chronic pain: Secondary | ICD-10-CM

## 2012-09-09 DIAGNOSIS — M545 Low back pain: Secondary | ICD-10-CM

## 2012-09-09 DIAGNOSIS — Z23 Encounter for immunization: Secondary | ICD-10-CM

## 2012-09-09 DIAGNOSIS — K219 Gastro-esophageal reflux disease without esophagitis: Secondary | ICD-10-CM

## 2012-09-09 DIAGNOSIS — Z0000 Encounter for general adult medical examination without abnormal findings: Secondary | ICD-10-CM | POA: Insufficient documentation

## 2012-09-09 MED ORDER — CYCLOBENZAPRINE HCL 10 MG PO TABS: 10.0000 mg | ORAL_TABLET | Freq: Every day | ORAL | Status: DC

## 2012-09-09 MED ORDER — GABAPENTIN 300 MG PO CAPS: 300.0000 mg | ORAL_CAPSULE | Freq: Three times a day (TID) | ORAL | Status: DC

## 2012-09-09 MED ORDER — HYDROCODONE-ACETAMINOPHEN 5-325 MG PO TABS: 1.0000 | ORAL_TABLET | Freq: Three times a day (TID) | ORAL | Status: DC | PRN

## 2012-09-09 NOTE — Assessment & Plan Note (Signed)
Stable on nexium

## 2012-09-09 NOTE — Assessment & Plan Note (Signed)
I have personally reviewed the Medicare Annual Wellness questionnaire and have noted 1. The patient's medical and social history 2. Their use of alcohol, tobacco or illicit drugs 3. Their current medications and supplements 4. The patient's functional ability including ADL's, fall risks, home safety risks and hearing or visual impairment. 5. Diet and physical activity 6. Evidence for depression or mood disorders The patients weight, height, BMI have been recorded in the chart.  Hearing and vision has been addressed. I have made referrals, counseling and provided education to the patient based review of the above and I have provided the pt with a written personalized care plan for preventive services. See scanned questionairre. Advanced directives not discussed   Reviewed preventative protocols and updated unless pt declined. DRE today.  Will need PSA next blood work. Possibly colonoscopy due 2015.

## 2012-09-09 NOTE — Addendum Note (Signed)
Addended by: Josph Macho A on: 09/09/2012 11:59 AM   Modules accepted: Orders

## 2012-09-09 NOTE — Patient Instructions (Addendum)
We've increased gabapentin to twice daily, if tolerating well, may go up to three times daily. I've refilled flexeril. Let's stop tramadol.  May try vicodin - sent to pharmacy.  Pass by Marcelle Smiling to sign controlled substance agreement. Sign release form for records from Dr Loreta Ave for latest colonoscopy. Tdap today (tetanus and pertussis shot). Low cholesterol diet handout provided today Return in 1 month for follow up pain

## 2012-09-09 NOTE — Addendum Note (Signed)
Addended by: Eustaquio Boyden on: 09/09/2012 11:54 AM   Modules accepted: Orders
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
## 2012-09-09 NOTE — Assessment & Plan Note (Signed)
Awaiting records from prior pain clinic eval.  On disability for this. Pt with TENS at home.  Avoiding NSAIDs 2/2 GERD. Tramadol did not help pain. Trial of vicodin - pt will fill out controlled substance agreement today.

## 2012-09-25 ENCOUNTER — Encounter: Payer: Self-pay | Admitting: Family Medicine

## 2012-09-29 ENCOUNTER — Encounter: Payer: Self-pay | Admitting: Family Medicine

## 2012-10-15 ENCOUNTER — Encounter: Payer: Self-pay | Admitting: Family Medicine

## 2012-10-15 ENCOUNTER — Ambulatory Visit (INDEPENDENT_AMBULATORY_CARE_PROVIDER_SITE_OTHER): Payer: Medicare Other | Admitting: Family Medicine

## 2012-10-15 VITALS — BP 134/94 | HR 84 | Temp 98.7°F | Wt 233.8 lb

## 2012-10-15 DIAGNOSIS — M545 Low back pain, unspecified: Secondary | ICD-10-CM

## 2012-10-15 DIAGNOSIS — Z23 Encounter for immunization: Secondary | ICD-10-CM

## 2012-10-15 DIAGNOSIS — G8929 Other chronic pain: Secondary | ICD-10-CM

## 2012-10-15 MED ORDER — HYDROCODONE-ACETAMINOPHEN 10-325 MG PO TABS: 1.0000 | ORAL_TABLET | Freq: Three times a day (TID) | ORAL | Status: DC | PRN
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
## 2012-10-15 NOTE — Patient Instructions (Signed)
Sign release of records from Dr. Wynn Banker. Flu shot today. Let's do trial of higher dose hydrocodone (10/325), up to 3 times daily but try to space out. Let me know how this does. Return in 2-3 months for follow up

## 2012-10-15 NOTE — Addendum Note (Signed)
Addended by: Josph Macho A on: 10/15/2012 11:17 AM   Modules accepted: Orders

## 2012-10-15 NOTE — Assessment & Plan Note (Signed)
I never received records from Dr. Wynn Banker.  Will request another ROI filled out. On disability for back. Hydrocodone 5mg  has helped some, but pain relief only lasts ~1 hour.  Will trial higher dose, provided with #40 of hydrocodone 10/325mg  today. I asked for prior UDS to be reprinted as itw as not in his chart.  Rodney Charles was able to print out new copy - collected 8/5, appropriately positive for tramadol and negative for hydrocodone (prior to starting vicodin). rtc 2-3 mo for f/u.

## 2012-11-24 ENCOUNTER — Encounter: Payer: Self-pay | Admitting: Family Medicine

## 2013-01-03 ENCOUNTER — Other Ambulatory Visit: Payer: Self-pay | Admitting: Family Medicine

## 2013-01-14 ENCOUNTER — Encounter: Payer: Self-pay | Admitting: Family Medicine

## 2013-01-14 ENCOUNTER — Ambulatory Visit (INDEPENDENT_AMBULATORY_CARE_PROVIDER_SITE_OTHER): Payer: Medicare Other | Admitting: Family Medicine

## 2013-01-14 VITALS — BP 136/96 | HR 76 | Temp 98.5°F | Wt 238.5 lb

## 2013-01-14 DIAGNOSIS — J3489 Other specified disorders of nose and nasal sinuses: Secondary | ICD-10-CM

## 2013-01-14 DIAGNOSIS — R0981 Nasal congestion: Secondary | ICD-10-CM | POA: Insufficient documentation

## 2013-01-14 DIAGNOSIS — G8929 Other chronic pain: Secondary | ICD-10-CM

## 2013-01-14 DIAGNOSIS — M542 Cervicalgia: Secondary | ICD-10-CM

## 2013-01-14 MED ORDER — CYCLOBENZAPRINE HCL 10 MG PO TABS: 10.0000 mg | ORAL_TABLET | Freq: Every day | ORAL | Status: DC

## 2013-01-14 MED ORDER — HYDROCODONE-ACETAMINOPHEN 10-325 MG PO TABS: 1.0000 | ORAL_TABLET | Freq: Three times a day (TID) | ORAL | Status: DC | PRN

## 2013-01-14 MED ORDER — FLUTICASONE PROPIONATE 50 MCG/ACT NA SUSP: 2.0000 | Freq: Every day | NASAL | Status: DC

## 2013-01-14 NOTE — Progress Notes (Signed)
Pre-visit discussion using our clinic review tool. No additional management support is needed unless otherwise documented below in the visit note.  
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
## 2013-01-14 NOTE — Assessment & Plan Note (Signed)
Trial of flonase and nasal saline.

## 2013-01-14 NOTE — Assessment & Plan Note (Signed)
Worsened off flexeril. Recommended restart muscle relaxant and refilled hydrocodone.

## 2013-01-14 NOTE — Patient Instructions (Signed)
For sinus congestion - use flonase (sent to pharmacy) and over the counter nasal saline spray. I've refilled flexeril and hydrocodone pain medicine. Let me know if worsening pain despite above. Return in 6 months for follow up

## 2013-02-23 ENCOUNTER — Other Ambulatory Visit: Payer: Self-pay

## 2013-02-23 NOTE — Telephone Encounter (Signed)
Pt request rx hydrocodone apap. Call when ready for pick up. 

## 2013-02-24 MED ORDER — HYDROCODONE-ACETAMINOPHEN 10-325 MG PO TABS: 1.0000 | ORAL_TABLET | Freq: Three times a day (TID) | ORAL | Status: DC | PRN

## 2013-02-24 NOTE — Telephone Encounter (Signed)
Detailed message left on cell phone as instructed that script is up front and ready for pickup.

## 2013-02-24 NOTE — Telephone Encounter (Signed)
Printed and placed in Kim's box. 

## 2013-04-03 ENCOUNTER — Encounter: Payer: Self-pay | Admitting: Internal Medicine

## 2013-04-03 ENCOUNTER — Ambulatory Visit (INDEPENDENT_AMBULATORY_CARE_PROVIDER_SITE_OTHER): Payer: Medicare Other | Admitting: Internal Medicine

## 2013-04-03 ENCOUNTER — Ambulatory Visit (INDEPENDENT_AMBULATORY_CARE_PROVIDER_SITE_OTHER)
Admission: RE | Admit: 2013-04-03 | Discharge: 2013-04-03 | Disposition: A | Discharge status: Home / Self Care | Payer: Medicare Other | Source: Ambulatory Visit | Attending: Internal Medicine | Admitting: Internal Medicine

## 2013-04-03 VITALS — BP 128/84 | HR 100 | Temp 98.6°F | Wt 252.0 lb

## 2013-04-03 DIAGNOSIS — M25519 Pain in unspecified shoulder: Secondary | ICD-10-CM

## 2013-04-03 DIAGNOSIS — G8929 Other chronic pain: Secondary | ICD-10-CM

## 2013-04-03 DIAGNOSIS — M542 Cervicalgia: Secondary | ICD-10-CM

## 2013-04-03 DIAGNOSIS — M25511 Pain in right shoulder: Secondary | ICD-10-CM

## 2013-04-03 DIAGNOSIS — M5412 Radiculopathy, cervical region: Secondary | ICD-10-CM
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
## 2013-04-03 NOTE — Progress Notes (Signed)
Pre visit review using our clinic review tool, if applicable. No additional management support is needed unless otherwise documented below in the visit note. 

## 2013-04-03 NOTE — Patient Instructions (Addendum)
   Shoulder Pain The shoulder is the joint that connects your arms to your body. The bones that form the shoulder joint include the upper arm bone (humerus), the shoulder blade (scapula), and the collarbone (clavicle). The top of the humerus is shaped like a ball and fits into a rather flat socket on the scapula (glenoid cavity). A combination of muscles and strong, fibrous tissues that connect muscles to bones (tendons) support your shoulder joint and hold the ball in the socket. Small, fluid-filled sacs (bursae) are located in different areas of the joint. They act as cushions between the bones and the overlying soft tissues and help reduce friction between the gliding tendons and the bone as you move your arm. Your shoulder joint allows a wide range of motion in your arm. This range of motion allows you to do things like scratch your back or throw a ball. However, this range of motion also makes your shoulder more prone to pain from overuse and injury. Causes of shoulder pain can originate from both injury and overuse and usually can be grouped in the following four categories:  Redness, swelling, and pain (inflammation) of the tendon (tendinitis) or the bursae (bursitis).  Instability, such as a dislocation of the joint.  Inflammation of the joint (arthritis).  Broken bone (fracture). HOME CARE INSTRUCTIONS   Apply ice to the sore area.  Put ice in a plastic bag.  Place a towel between your skin and the bag.  Leave the ice on for 15-20 minutes, 03-04 times per day for the first 2 days.  Stop using cold packs if they do not help with the pain.  If you have a shoulder sling or immobilizer, wear it as long as your caregiver instructs. Only remove it to shower or bathe. Move your arm as little as possible, but keep your hand moving to prevent swelling.  Squeeze a soft ball or foam pad as much as possible to help prevent swelling.  Only take over-the-counter or prescription medicines for  pain, discomfort, or fever as directed by your caregiver. SEEK MEDICAL CARE IF:   Your shoulder pain increases, or new pain develops in your arm, hand, or fingers.  Your hand or fingers become cold and numb.  Your pain is not relieved with medicines. SEEK IMMEDIATE MEDICAL CARE IF:   Your arm, hand, or fingers are numb or tingling.  Your arm, hand, or fingers are significantly swollen or turn white or blue. MAKE SURE YOU:   Understand these instructions.  Will watch your condition.  Will get help right away if you are not doing well or get worse. Document Released: 11/01/2004 Document Revised: 10/17/2011 Document Reviewed: 01/06/2011 [employer] Patient Information 2014 [employer].  

## 2013-04-06 ENCOUNTER — Other Ambulatory Visit: Payer: Self-pay | Admitting: Internal Medicine

## 2013-04-06 ENCOUNTER — Other Ambulatory Visit: Payer: Self-pay

## 2013-04-06 DIAGNOSIS — M25511 Pain in right shoulder: Secondary | ICD-10-CM

## 2013-04-06 NOTE — Telephone Encounter (Signed)
Last prescribed 02/23/2013--please advise

## 2013-04-07 MED ORDER — HYDROCODONE-ACETAMINOPHEN 10-325 MG PO TABS: 1.0000 | ORAL_TABLET | Freq: Three times a day (TID) | ORAL | Status: DC | PRN

## 2013-04-07 NOTE — Telephone Encounter (Signed)
Left detailed message on VM letting pt know his Rx is ready for pick up

## 2013-04-17 ENCOUNTER — Ambulatory Visit
Admission: RE | Admit: 2013-04-17 | Discharge: 2013-04-17 | Disposition: A | Discharge status: Home / Self Care | Payer: Medicare Other | Source: Ambulatory Visit | Attending: Internal Medicine | Admitting: Internal Medicine

## 2013-04-17 DIAGNOSIS — M25511 Pain in right shoulder: Secondary | ICD-10-CM

## 2013-04-20 ENCOUNTER — Other Ambulatory Visit: Payer: Self-pay | Admitting: Internal Medicine

## 2013-04-20 DIAGNOSIS — M25511 Pain in right shoulder: Secondary | ICD-10-CM

## 2013-05-12 ENCOUNTER — Other Ambulatory Visit: Payer: Self-pay

## 2013-05-12 MED ORDER — HYDROCODONE-ACETAMINOPHEN 10-325 MG PO TABS: 1.0000 | ORAL_TABLET | Freq: Three times a day (TID) | ORAL | Status: DC | PRN

## 2013-05-12 NOTE — Telephone Encounter (Signed)
Printed and placed in Kim's box. 

## 2013-05-12 NOTE — Telephone Encounter (Signed)
Patient notified Rx ready for pick up. 

## 2013-05-12 NOTE — Telephone Encounter (Signed)
Pt left v/m requesting rx hydrocodone apap for back and shoulder pain. Call when ready for pick up.

## 2013-06-09 ENCOUNTER — Ambulatory Visit: Payer: Medicare Other | Attending: Orthopedic Surgery

## 2013-06-09 DIAGNOSIS — M542 Cervicalgia: Secondary | ICD-10-CM | POA: Insufficient documentation

## 2013-06-09 DIAGNOSIS — IMO0001 Reserved for inherently not codable concepts without codable children: Secondary | ICD-10-CM | POA: Diagnosis present

## 2013-06-22 ENCOUNTER — Ambulatory Visit: Payer: Medicare Other

## 2013-06-22 DIAGNOSIS — IMO0001 Reserved for inherently not codable concepts without codable children: Secondary | ICD-10-CM | POA: Diagnosis not present

## 2013-06-30 ENCOUNTER — Ambulatory Visit: Payer: Medicare Other

## 2013-06-30 DIAGNOSIS — IMO0001 Reserved for inherently not codable concepts without codable children: Secondary | ICD-10-CM | POA: Diagnosis not present

## 2013-07-02 ENCOUNTER — Ambulatory Visit: Payer: Medicare Other

## 2013-07-02 DIAGNOSIS — IMO0001 Reserved for inherently not codable concepts without codable children: Secondary | ICD-10-CM | POA: Diagnosis not present

## 2013-07-09 ENCOUNTER — Ambulatory Visit: Payer: Medicare Other | Attending: Orthopedic Surgery

## 2013-07-09 DIAGNOSIS — M542 Cervicalgia: Secondary | ICD-10-CM | POA: Insufficient documentation

## 2013-07-09 DIAGNOSIS — IMO0001 Reserved for inherently not codable concepts without codable children: Secondary | ICD-10-CM | POA: Diagnosis not present

## 2013-07-11 ENCOUNTER — Other Ambulatory Visit (HOSPITAL_COMMUNITY)
Admission: RE | Admit: 2013-07-11 | Discharge: 2013-07-11 | Disposition: A | Discharge status: Home / Self Care | Payer: Medicare Other | Source: Ambulatory Visit | Attending: Family Medicine | Admitting: Family Medicine

## 2013-07-11 ENCOUNTER — Encounter (HOSPITAL_COMMUNITY): Payer: Self-pay | Admitting: Emergency Medicine

## 2013-07-11 ENCOUNTER — Emergency Department (HOSPITAL_COMMUNITY)
Admission: EM | Admit: 2013-07-11 | Discharge: 2013-07-11 | Disposition: A | Discharge status: Home / Self Care | Payer: Medicare Other | Source: Home / Self Care | Attending: Family Medicine | Admitting: Family Medicine

## 2013-07-11 DIAGNOSIS — Z202 Contact with and (suspected) exposure to infections with a predominantly sexual mode of transmission: Secondary | ICD-10-CM

## 2013-07-11 DIAGNOSIS — Z113 Encounter for screening for infections with a predominantly sexual mode of transmission: Secondary | ICD-10-CM | POA: Insufficient documentation

## 2013-07-11 MED ORDER — CEFTRIAXONE SODIUM 250 MG IJ SOLR
250.0000 mg | Freq: Once | INTRAMUSCULAR | Status: AC
  Administered 2013-07-11: 250 mg via INTRAMUSCULAR

## 2013-07-11 MED ORDER — LIDOCAINE HCL (PF) 1 % IJ SOLN
INTRAMUSCULAR | Status: AC
  Filled 2013-07-11: qty 5

## 2013-07-11 MED ORDER — AZITHROMYCIN 250 MG PO TABS
1000.0000 mg | ORAL_TABLET | Freq: Once | ORAL | Status: AC
  Administered 2013-07-11: 1000 mg via ORAL

## 2013-07-11 MED ORDER — AZITHROMYCIN 250 MG PO TABS
ORAL_TABLET | ORAL | Status: AC
  Filled 2013-07-11: qty 4

## 2013-07-11 MED ORDER — CEFTRIAXONE SODIUM 250 MG IJ SOLR
INTRAMUSCULAR | Status: AC
  Filled 2013-07-11: qty 250

## 2013-07-11 NOTE — ED Notes (Signed)
Seen by Belenda Cruise schorr, np prior to this nurse
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
## 2013-07-11 NOTE — ED Provider Notes (Signed)
Medical screening examination/treatment/procedure(s) were performed by a resident physician or non-physician practitioner and as the supervising physician I was immediately available for consultation/collaboration.  Lynne Leader, MD   Gregor Hams, MD 07/11/13 [phone]

## 2013-07-11 NOTE — ED Notes (Signed)
Patient aware of post injection delay prior to being discharged from department 

## 2013-07-14 ENCOUNTER — Ambulatory Visit: Payer: Medicare Other

## 2013-07-14 DIAGNOSIS — IMO0001 Reserved for inherently not codable concepts without codable children: Secondary | ICD-10-CM | POA: Diagnosis not present

## 2013-07-15 ENCOUNTER — Ambulatory Visit: Payer: Medicare Other

## 2013-07-15 DIAGNOSIS — IMO0001 Reserved for inherently not codable concepts without codable children: Secondary | ICD-10-CM | POA: Diagnosis not present

## 2013-07-16 ENCOUNTER — Ambulatory Visit (INDEPENDENT_AMBULATORY_CARE_PROVIDER_SITE_OTHER): Payer: Medicare Other | Admitting: Family Medicine

## 2013-07-16 ENCOUNTER — Encounter: Payer: Self-pay | Admitting: Family Medicine

## 2013-07-16 VITALS — BP 122/80 | HR 68 | Temp 98.4°F | Wt 248.8 lb

## 2013-07-16 DIAGNOSIS — M545 Low back pain, unspecified: Secondary | ICD-10-CM

## 2013-07-16 DIAGNOSIS — G8929 Other chronic pain: Secondary | ICD-10-CM

## 2013-07-16 DIAGNOSIS — N644 Mastodynia: Secondary | ICD-10-CM

## 2013-07-16 MED ORDER — HYDROCODONE-ACETAMINOPHEN 10-325 MG PO TABS: 1.0000 | ORAL_TABLET | Freq: Three times a day (TID) | ORAL | Status: DC | PRN

## 2013-07-16 NOTE — Assessment & Plan Note (Signed)
Refilled hydrocodone - sparing use.  #40 Q2 months. Continue flexeril, gabapentin and tylenol with sparing NSAID use as well (h/o GERD)

## 2013-07-16 NOTE — Assessment & Plan Note (Addendum)
Clinically has obstructed L nipple duct. rec warm compresses regularly x 2 wks, if not improved or any enlargement/deterioration will obtain US. Discussed ddx.

## 2013-07-16 NOTE — Patient Instructions (Addendum)
I've refilled hydrocodone for you. For left nipple - warm compresses 3 times a daily for next 2 weeks - if not improved with this, let me know for ultrasound and further investigation.  If any worsening, let me know sooner Return in 6-8 months for medicare wellness visit

## 2013-07-16 NOTE — Progress Notes (Signed)
Pre visit review using our clinic review tool, if applicable. No additional management support is needed unless otherwise documented below in the visit note. 
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
## 2013-07-20 ENCOUNTER — Ambulatory Visit: Payer: Medicare Other | Admitting: Physical Therapy

## 2013-07-20 DIAGNOSIS — IMO0001 Reserved for inherently not codable concepts without codable children: Secondary | ICD-10-CM | POA: Diagnosis not present

## 2013-07-23 ENCOUNTER — Ambulatory Visit: Payer: Medicare Other | Admitting: Physical Therapy

## 2013-07-23 DIAGNOSIS — IMO0001 Reserved for inherently not codable concepts without codable children: Secondary | ICD-10-CM | POA: Diagnosis not present

## 2013-08-04 ENCOUNTER — Ambulatory Visit: Payer: Medicare Other

## 2013-08-04 DIAGNOSIS — IMO0001 Reserved for inherently not codable concepts without codable children: Secondary | ICD-10-CM | POA: Diagnosis not present

## 2013-08-06 ENCOUNTER — Ambulatory Visit: Payer: Medicare Other | Attending: Orthopedic Surgery

## 2013-08-06 DIAGNOSIS — M542 Cervicalgia: Secondary | ICD-10-CM | POA: Diagnosis not present

## 2013-08-06 DIAGNOSIS — IMO0001 Reserved for inherently not codable concepts without codable children: Secondary | ICD-10-CM | POA: Diagnosis not present

## 2013-08-13 ENCOUNTER — Encounter: Payer: Medicare Other | Admitting: Physical Therapy

## 2013-08-13 ENCOUNTER — Ambulatory Visit: Payer: Medicare Other | Admitting: Physical Therapy

## 2013-10-06 HISTORY — PX: SHOULDER SURGERY: SHX246

## 2013-10-09 ENCOUNTER — Other Ambulatory Visit: Payer: Self-pay

## 2013-10-09 MED ORDER — HYDROCODONE-ACETAMINOPHEN 10-325 MG PO TABS: 1.0000 | ORAL_TABLET | Freq: Three times a day (TID) | ORAL | Status: DC | PRN

## 2013-10-09 NOTE — Telephone Encounter (Signed)
Printed and placed in Kim's box. 

## 2013-10-09 NOTE — Telephone Encounter (Signed)
Pt left v/m requesting rx hydrocodone apap. Call when ready for pick up.  

## 2013-10-13 NOTE — Telephone Encounter (Signed)
Message left notifying patient and Rx placed up front for pick up. 

## 2013-12-11 ENCOUNTER — Other Ambulatory Visit: Payer: Self-pay

## 2013-12-11 NOTE — Telephone Encounter (Signed)
Pt left v/m requesting rx hydrocodone apap. Call when ready for pick up.  

## 2013-12-14 MED ORDER — HYDROCODONE-ACETAMINOPHEN 10-325 MG PO TABS: 1.0000 | ORAL_TABLET | Freq: Three times a day (TID) | ORAL | Status: DC | PRN

## 2013-12-14 NOTE — Telephone Encounter (Signed)
Printed and placed in Kim's box. 

## 2013-12-14 NOTE — Telephone Encounter (Signed)
Message left notifying patient and Rx placed up front for pick up. 

## 2014-01-15 ENCOUNTER — Other Ambulatory Visit: Payer: Self-pay

## 2014-01-15 NOTE — Telephone Encounter (Signed)
Pt left v/m requesting rx hydrocodone apap. Call when ready for pick up.last seen 07/16/13.

## 2014-01-18 MED ORDER — HYDROCODONE-ACETAMINOPHEN 10-325 MG PO TABS: 1.0000 | ORAL_TABLET | Freq: Three times a day (TID) | ORAL | Status: DC | PRN

## 2014-01-18 NOTE — Telephone Encounter (Signed)
Patient notified by telephone that script is up front ready for pickup. 

## 2014-01-18 NOTE — Telephone Encounter (Signed)
Printed and placed in Kim's box. 

## 2014-02-05 DIAGNOSIS — M109 Gout, unspecified: Secondary | ICD-10-CM

## 2014-02-05 HISTORY — DX: Gout, unspecified: M10.9

## 2014-02-22 ENCOUNTER — Other Ambulatory Visit: Payer: Self-pay

## 2014-02-22 MED ORDER — HYDROCODONE-ACETAMINOPHEN 10-325 MG PO TABS: 1.0000 | ORAL_TABLET | Freq: Three times a day (TID) | ORAL | Status: DC | PRN

## 2014-02-22 NOTE — Telephone Encounter (Signed)
Printed and in Kim's box 

## 2014-02-22 NOTE — Telephone Encounter (Signed)
Pt left v/m requesting rx hydrocodone apap. Call when ready for pick up. Pt last seen 07/16/13.

## 2014-02-23 NOTE — Telephone Encounter (Signed)
Patient notified and Rx placed up front for pick up. 

## 2014-03-07 ENCOUNTER — Other Ambulatory Visit: Payer: Self-pay | Admitting: Family Medicine

## 2014-03-07 DIAGNOSIS — K219 Gastro-esophageal reflux disease without esophagitis: Secondary | ICD-10-CM

## 2014-03-07 DIAGNOSIS — E785 Hyperlipidemia, unspecified: Secondary | ICD-10-CM | POA: Insufficient documentation

## 2014-03-07 DIAGNOSIS — Z125 Encounter for screening for malignant neoplasm of prostate: Secondary | ICD-10-CM

## 2014-03-11 ENCOUNTER — Other Ambulatory Visit: Payer: Medicare Other

## 2014-03-18 ENCOUNTER — Encounter: Payer: Self-pay | Admitting: *Deleted

## 2014-03-18 ENCOUNTER — Encounter: Payer: Self-pay | Admitting: Family Medicine

## 2014-03-18 ENCOUNTER — Ambulatory Visit (INDEPENDENT_AMBULATORY_CARE_PROVIDER_SITE_OTHER): Payer: Medicare Other | Admitting: Family Medicine

## 2014-03-18 VITALS — BP 130/88 | HR 62 | Temp 98.3°F | Ht 74.0 in | Wt 258.2 lb

## 2014-03-18 DIAGNOSIS — Z0000 Encounter for general adult medical examination without abnormal findings: Secondary | ICD-10-CM

## 2014-03-18 DIAGNOSIS — J208 Acute bronchitis due to other specified organisms: Secondary | ICD-10-CM

## 2014-03-18 DIAGNOSIS — N644 Mastodynia: Secondary | ICD-10-CM

## 2014-03-18 DIAGNOSIS — G894 Chronic pain syndrome: Secondary | ICD-10-CM

## 2014-03-18 DIAGNOSIS — K219 Gastro-esophageal reflux disease without esophagitis: Secondary | ICD-10-CM

## 2014-03-18 DIAGNOSIS — E785 Hyperlipidemia, unspecified: Secondary | ICD-10-CM

## 2014-03-18 DIAGNOSIS — Z23 Encounter for immunization: Secondary | ICD-10-CM

## 2014-03-18 DIAGNOSIS — Z125 Encounter for screening for malignant neoplasm of prostate: Secondary | ICD-10-CM

## 2014-03-18 DIAGNOSIS — Z7189 Other specified counseling: Secondary | ICD-10-CM

## 2014-03-18 DIAGNOSIS — F39 Unspecified mood [affective] disorder: Secondary | ICD-10-CM

## 2014-03-18 DIAGNOSIS — G8929 Other chronic pain: Secondary | ICD-10-CM | POA: Insufficient documentation

## 2014-03-18 MED ORDER — CELECOXIB 100 MG PO CAPS
100.0000 mg | ORAL_CAPSULE | Freq: Every day | ORAL | Status: DC
Start: 2014-03-18 — End: 2014-09-17

## 2014-03-18 MED ORDER — AMITRIPTYLINE HCL 25 MG PO TABS: 25.0000 mg | ORAL_TABLET | Freq: Every day | ORAL | Status: DC

## 2014-03-18 NOTE — Assessment & Plan Note (Addendum)
Improved with warm compresses. Today symmetrical bilaterally
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
## 2014-03-18 NOTE — Assessment & Plan Note (Signed)
Discussed - situational stemming from chronic pain. Will trial amitriptyline 25-50mg  nightly

## 2014-03-18 NOTE — Assessment & Plan Note (Addendum)
Start elavil and celebrex today. Continue hydrocodone 10/325 sparingly, tylenol flexeril and gabapentin.

## 2014-03-18 NOTE — Addendum Note (Signed)
Addended by: Ria Bush on: 03/18/2014 02:49 PM   Modules accepted: Miquel Dunn

## 2014-03-18 NOTE — Assessment & Plan Note (Signed)

## 2014-03-18 NOTE — Assessment & Plan Note (Signed)
Stable on nexium

## 2014-03-18 NOTE — Assessment & Plan Note (Signed)
Check FLP when returns fasting. 

## 2014-03-18 NOTE — Assessment & Plan Note (Addendum)
Anticipate R sided rhonchi heard on exam correspond to developing viral bronchitis.  Supportive care discussed. rec plain mucinex and increased fluids. If not improving as expected, low threshold to return for imaging.  Advised to let us know if any worsening or not improving as expected. Remote smoking histroy.

## 2014-03-18 NOTE — Assessment & Plan Note (Signed)
Advanced directives: does not have. Thinks sister Levada Dy would be HCPOA. Packet provided today

## 2014-03-18 NOTE — Patient Instructions (Addendum)
Return over the next week fasting for labs - we open up at 7:30am.  UDS today. Trial celebrex 100mg  daily - coupon provided today.  Trial amitriptyline 25-50mg  nightly for mood and pain. Ask Dr Collene Mares to send me copy of colonoscopy when it's done. Advanced directive packet provided today. For congestion - try plain mucinex with plenty of water to help mobilize mucous out. Increase water intake. If persistent congestion or wheezing past 1-2 weeks, or any worsening or fever >101 let us know.  Return as needed or in 6 months for follow up visit.

## 2014-03-18 NOTE — Progress Notes (Signed)
Pre visit review using our clinic review tool, if applicable. No additional management support is needed unless otherwise documented below in the visit note. 

## 2014-03-25 ENCOUNTER — Other Ambulatory Visit (INDEPENDENT_AMBULATORY_CARE_PROVIDER_SITE_OTHER): Payer: Medicare Other

## 2014-03-25 DIAGNOSIS — E785 Hyperlipidemia, unspecified: Secondary | ICD-10-CM

## 2014-03-25 DIAGNOSIS — Z125 Encounter for screening for malignant neoplasm of prostate: Secondary | ICD-10-CM

## 2014-03-25 LAB — LIPID PANEL
CHOLESTEROL: 226 mg/dL — AB (ref 0–200)
HDL: 48.3 mg/dL (ref >39.00)
LDL Cholesterol: 158 mg/dL — ABNORMAL HIGH (ref 0–99)
NonHDL: 177.7
Total CHOL/HDL Ratio: 5
Triglycerides: 100 mg/dL (ref 0.0–149.0)
VLDL: 20 mg/dL (ref 0.0–40.0)

## 2014-03-25 LAB — BASIC METABOLIC PANEL
BUN: 8 mg/dL (ref 6–23)
CALCIUM: 9.3 mg/dL (ref 8.4–10.5)
CO2: 33 mEq/L — ABNORMAL HIGH (ref 19–32)
CREATININE: 1.15 mg/dL (ref 0.40–1.50)
Chloride: 103 mEq/L (ref 96–112)
GFR: 84.53 mL/min (ref >60.00)
GLUCOSE: 81 mg/dL (ref 70–99)
POTASSIUM: 3.6 meq/L (ref 3.5–5.1)
Sodium: 140 mEq/L (ref 135–145)

## 2014-03-25 LAB — PSA, MEDICARE: PSA: 0.56 ng/ml (ref 0.10–4.00)

## 2014-03-26 ENCOUNTER — Encounter: Payer: Self-pay | Admitting: *Deleted

## 2014-03-31 ENCOUNTER — Encounter: Payer: Self-pay | Admitting: Family Medicine

## 2014-04-06 HISTORY — PX: COLONOSCOPY: SHX174

## 2014-04-07 LAB — HM COLONOSCOPY

## 2014-04-21 ENCOUNTER — Other Ambulatory Visit: Payer: Self-pay

## 2014-04-21 MED ORDER — HYDROCODONE-ACETAMINOPHEN 10-325 MG PO TABS: 1.0000 | ORAL_TABLET | Freq: Three times a day (TID) | ORAL | Status: DC | PRN

## 2014-04-21 NOTE — Telephone Encounter (Signed)
Patient notified and Rx placed up front for pick up. 

## 2014-04-21 NOTE — Telephone Encounter (Signed)
Printed and in Kim's box 

## 2014-04-21 NOTE — Telephone Encounter (Signed)
Pt left v/m requesting rx hydrocodone apap. Call when ready for pick up. Pt last seen 03/18/14.

## 2014-05-24 ENCOUNTER — Other Ambulatory Visit: Payer: Self-pay

## 2014-05-24 NOTE — Telephone Encounter (Signed)
Pt left v/m requesting rx hydrocodone apap. Call when ready for pick up. Pt seen 03/18/14.

## 2014-05-25 MED ORDER — HYDROCODONE-ACETAMINOPHEN 10-325 MG PO TABS: 1.0000 | ORAL_TABLET | Freq: Three times a day (TID) | ORAL | Status: DC | PRN

## 2014-05-25 NOTE — Telephone Encounter (Signed)
Message left advising patient and Rx placed up front for pick up. 

## 2014-05-25 NOTE — Telephone Encounter (Signed)
printed and in Kim's box. 

## 2014-09-09 ENCOUNTER — Other Ambulatory Visit: Payer: Self-pay | Admitting: Family Medicine

## 2014-09-09 NOTE — Telephone Encounter (Signed)
Ok to refill 

## 2014-09-17 ENCOUNTER — Ambulatory Visit (INDEPENDENT_AMBULATORY_CARE_PROVIDER_SITE_OTHER): Payer: Medicare Other | Admitting: Family Medicine

## 2014-09-17 ENCOUNTER — Encounter: Payer: Self-pay | Admitting: Family Medicine

## 2014-09-17 VITALS — BP 122/84 | HR 82 | Temp 98.6°F | Wt 253.5 lb

## 2014-09-17 DIAGNOSIS — M25521 Pain in right elbow: Secondary | ICD-10-CM | POA: Diagnosis not present

## 2014-09-17 DIAGNOSIS — G894 Chronic pain syndrome: Secondary | ICD-10-CM

## 2014-09-17 DIAGNOSIS — M545 Low back pain: Secondary | ICD-10-CM

## 2014-09-17 DIAGNOSIS — G8929 Other chronic pain: Secondary | ICD-10-CM

## 2014-09-17 LAB — BASIC METABOLIC PANEL
BUN: 17 mg/dL (ref 6–23)
CALCIUM: 9.8 mg/dL (ref 8.4–10.5)
CHLORIDE: 100 meq/L (ref 96–112)
CO2: 28 meq/L (ref 19–32)
Creatinine, Ser: 1.34 mg/dL (ref 0.40–1.50)
GFR: 70.74 mL/min (ref >60.00)
Glucose, Bld: 100 mg/dL — ABNORMAL HIGH (ref 70–99)
POTASSIUM: 3.7 meq/L (ref 3.5–5.1)
Sodium: 136 mEq/L (ref 135–145)

## 2014-09-17 LAB — CBC WITH DIFFERENTIAL/PLATELET
BASOS PCT: 0.4 % (ref 0.0–3.0)
Basophils Absolute: 0 10*3/uL (ref 0.0–0.1)
Eosinophils Absolute: 0.4 10*3/uL (ref 0.0–0.7)
Eosinophils Relative: 4.7 % (ref 0.0–5.0)
HCT: 42.8 % (ref 39.0–52.0)
Hemoglobin: 14 g/dL (ref 13.0–17.0)
LYMPHS PCT: 42.9 % (ref 12.0–46.0)
Lymphs Abs: 3.5 10*3/uL (ref 0.7–4.0)
MCHC: 32.6 g/dL (ref 30.0–36.0)
MCV: 80.6 fl (ref 78.0–100.0)
MONOS PCT: 9 % (ref 3.0–12.0)
Monocytes Absolute: 0.7 10*3/uL (ref 0.1–1.0)
Neutro Abs: 3.5 10*3/uL (ref 1.4–7.7)
Neutrophils Relative %: 43 % (ref 43.0–77.0)
Platelets: 187 10*3/uL (ref 150.0–400.0)
RBC: 5.31 Mil/uL (ref 4.22–5.81)
RDW: 13.4 % (ref 11.5–15.5)
WBC: 8.1 10*3/uL (ref 4.0–10.5)

## 2014-09-17 LAB — SEDIMENTATION RATE: SED RATE: 14 mm/h (ref 0–22)

## 2014-09-17 LAB — URIC ACID: Uric Acid, Serum: 10.5 mg/dL — ABNORMAL HIGH (ref 4.0–7.8)

## 2014-09-17 MED ORDER — HYDROCODONE-ACETAMINOPHEN 10-325 MG PO TABS: 1.0000 | ORAL_TABLET | Freq: Three times a day (TID) | ORAL | Status: DC | PRN

## 2014-09-17 MED ORDER — CELECOXIB 100 MG PO CAPS: 100.0000 mg | ORAL_CAPSULE | Freq: Every day | ORAL | Status: DC

## 2014-09-17 MED ORDER — GABAPENTIN 300 MG PO CAPS
300.0000 mg | ORAL_CAPSULE | Freq: Three times a day (TID) | ORAL | Status: DC
Start: 2014-09-17 — End: 2015-09-10

## 2014-09-17 NOTE — Progress Notes (Signed)
Pre visit review using our clinic review tool, if applicable. No additional management support is needed unless otherwise documented below in the visit note. 

## 2014-09-17 NOTE — Patient Instructions (Addendum)
Medicines refilled today. For elbow - increase celebrex to 100mg  twice daily. Continue heating had to elbow.  labwork today to check for gout. Return as needed or in 6 months for medicare wellness visit

## 2014-09-17 NOTE — Assessment & Plan Note (Signed)
Continue sparing hydrocodone (last filled #40 05/2014). Continue amitritpyline, celebrex, gabapentin and tylenol prn. Advised against using aleve.

## 2014-09-17 NOTE — Assessment & Plan Note (Signed)
UDS 03/2014, low risk patient.
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
## 2014-09-17 NOTE — Assessment & Plan Note (Addendum)
?  gout given tender even to touch. No shingles rash, FROM remains, not consistent with infection as sxs have improved on their own Check urate, CBC, BMP, and ESR today. If UA elevated, consider adding colchicine. For now increase celebrex to $RemoveBef'100mg'[id number]$  bid and continue heating pad.

## 2014-09-18 ENCOUNTER — Other Ambulatory Visit: Payer: Self-pay | Admitting: Family Medicine

## 2014-09-18 ENCOUNTER — Encounter: Payer: Self-pay | Admitting: Family Medicine

## 2014-09-18 DIAGNOSIS — M109 Gout, unspecified: Secondary | ICD-10-CM | POA: Insufficient documentation

## 2014-09-18 MED ORDER — COLCHICINE 0.6 MG PO TABS: 0.6000 mg | ORAL_TABLET | Freq: Every day | ORAL | Status: DC | PRN

## 2014-10-28 ENCOUNTER — Other Ambulatory Visit: Payer: Self-pay

## 2014-10-28 MED ORDER — HYDROCODONE-ACETAMINOPHEN 10-325 MG PO TABS: 1.0000 | ORAL_TABLET | Freq: Three times a day (TID) | ORAL | Status: DC | PRN

## 2014-10-28 NOTE — Telephone Encounter (Signed)
Printed and in Kim's box 

## 2014-10-28 NOTE — Telephone Encounter (Signed)
Pt left v/m requesting rx hydrocodone apap. Call when ready for pick up. Pt last seen and rx last refilled # 40 on 09/17/14.

## 2014-11-01 NOTE — Telephone Encounter (Signed)
Patient notified and Rx placed up front for pick up. 

## 2014-12-06 ENCOUNTER — Other Ambulatory Visit: Payer: Self-pay

## 2014-12-06 MED ORDER — HYDROCODONE-ACETAMINOPHEN 10-325 MG PO TABS: 1.0000 | ORAL_TABLET | Freq: Three times a day (TID) | ORAL | Status: DC | PRN

## 2014-12-06 NOTE — Telephone Encounter (Signed)
Pt left v/m requesting rx hydrocodone apap. Call when ready for pick up. rx last printed # 40 on 10/28/14; last seen 09/17/14.

## 2014-12-06 NOTE — Telephone Encounter (Signed)
printed and in Kim's box. 

## 2014-12-07 NOTE — Telephone Encounter (Signed)
Patient notified and Rx placed up front for pick up. 

## 2015-01-10 ENCOUNTER — Other Ambulatory Visit: Payer: Self-pay

## 2015-01-10 MED ORDER — HYDROCODONE-ACETAMINOPHEN 10-325 MG PO TABS: 1.0000 | ORAL_TABLET | Freq: Three times a day (TID) | ORAL | Status: DC | PRN

## 2015-01-10 NOTE — Telephone Encounter (Signed)
Printed and in Kim's box 

## 2015-01-10 NOTE — Telephone Encounter (Signed)
Pt left v/m requesting rx hydrocodone apap. Call when ready for pick up. Last printed # 40 on 12/06/14; last seen 09/17/14.

## 2015-01-10 NOTE — Telephone Encounter (Signed)
Patient notified and Rx placed up front for pick up. 

## 2015-01-12 ENCOUNTER — Other Ambulatory Visit: Payer: Self-pay | Admitting: Family Medicine

## 2015-02-14 ENCOUNTER — Other Ambulatory Visit: Payer: Self-pay

## 2015-02-14 NOTE — Telephone Encounter (Signed)
Pt left v/m requesting rx hydrocodone apap. Call when ready for pick up. rx last printed #40 on 01/10/15. Last seen 09/17/14.

## 2015-02-15 MED ORDER — HYDROCODONE-ACETAMINOPHEN 10-325 MG PO TABS: 1.0000 | ORAL_TABLET | Freq: Three times a day (TID) | ORAL | Status: DC | PRN

## 2015-02-15 NOTE — Telephone Encounter (Signed)
Message left advising patient and Rx placed up front for pick up. 

## 2015-02-15 NOTE — Telephone Encounter (Signed)
printed and in Kim's box. 

## 2015-03-13 ENCOUNTER — Other Ambulatory Visit: Payer: Self-pay | Admitting: Family Medicine

## 2015-03-13 DIAGNOSIS — Z1159 Encounter for screening for other viral diseases: Secondary | ICD-10-CM

## 2015-03-13 DIAGNOSIS — E785 Hyperlipidemia, unspecified: Secondary | ICD-10-CM

## 2015-03-13 DIAGNOSIS — Z125 Encounter for screening for malignant neoplasm of prostate: Secondary | ICD-10-CM

## 2015-03-13 DIAGNOSIS — M109 Gout, unspecified: Secondary | ICD-10-CM

## 2015-03-14 ENCOUNTER — Other Ambulatory Visit: Payer: Self-pay | Admitting: Family Medicine

## 2015-03-14 ENCOUNTER — Other Ambulatory Visit (INDEPENDENT_AMBULATORY_CARE_PROVIDER_SITE_OTHER): Payer: Medicare Other

## 2015-03-14 ENCOUNTER — Encounter: Payer: Self-pay | Admitting: *Deleted

## 2015-03-14 DIAGNOSIS — E785 Hyperlipidemia, unspecified: Secondary | ICD-10-CM

## 2015-03-14 DIAGNOSIS — M109 Gout, unspecified: Secondary | ICD-10-CM

## 2015-03-14 DIAGNOSIS — Z125 Encounter for screening for malignant neoplasm of prostate: Secondary | ICD-10-CM

## 2015-03-14 DIAGNOSIS — M10021 Idiopathic gout, right elbow: Secondary | ICD-10-CM | POA: Diagnosis not present

## 2015-03-14 DIAGNOSIS — Z1159 Encounter for screening for other viral diseases: Secondary | ICD-10-CM

## 2015-03-14 LAB — LIPID PANEL
Cholesterol: 224 mg/dL — ABNORMAL HIGH (ref 0–200)
HDL: 50.2 mg/dL (ref >39.00)
LDL Cholesterol: 153 mg/dL — ABNORMAL HIGH (ref 0–99)
NONHDL: 173.43
Total CHOL/HDL Ratio: 4
Triglycerides: 104 mg/dL (ref 0.0–149.0)
VLDL: 20.8 mg/dL (ref 0.0–40.0)

## 2015-03-14 LAB — BASIC METABOLIC PANEL
BUN: 12 mg/dL (ref 6–23)
CHLORIDE: 103 meq/L (ref 96–112)
CO2: 28 mEq/L (ref 19–32)
CREATININE: 1.17 mg/dL (ref 0.40–1.50)
Calcium: 9.5 mg/dL (ref 8.4–10.5)
GFR: 82.58 mL/min (ref >60.00)
Glucose, Bld: 101 mg/dL — ABNORMAL HIGH (ref 70–99)
POTASSIUM: 3.5 meq/L (ref 3.5–5.1)
Sodium: 141 mEq/L (ref 135–145)

## 2015-03-14 LAB — URIC ACID: URIC ACID, SERUM: 8.5 mg/dL — AB (ref 4.0–7.8)

## 2015-03-14 LAB — PSA: PSA: 0.58 ng/mL (ref 0.10–4.00)

## 2015-03-15 LAB — HEPATITIS C ANTIBODY: HCV Ab: NEGATIVE

## 2015-03-21 ENCOUNTER — Ambulatory Visit (INDEPENDENT_AMBULATORY_CARE_PROVIDER_SITE_OTHER): Payer: Medicare Other | Admitting: Family Medicine

## 2015-03-21 ENCOUNTER — Encounter: Payer: Self-pay | Admitting: Family Medicine

## 2015-03-21 VITALS — BP 124/90 | HR 84 | Temp 98.6°F | Wt 256.5 lb

## 2015-03-21 DIAGNOSIS — M545 Low back pain: Secondary | ICD-10-CM

## 2015-03-21 DIAGNOSIS — Z0000 Encounter for general adult medical examination without abnormal findings: Secondary | ICD-10-CM | POA: Diagnosis not present

## 2015-03-21 DIAGNOSIS — G894 Chronic pain syndrome: Secondary | ICD-10-CM

## 2015-03-21 DIAGNOSIS — E785 Hyperlipidemia, unspecified: Secondary | ICD-10-CM

## 2015-03-21 DIAGNOSIS — Z23 Encounter for immunization: Secondary | ICD-10-CM | POA: Diagnosis not present

## 2015-03-21 DIAGNOSIS — Z7189 Other specified counseling: Secondary | ICD-10-CM

## 2015-03-21 DIAGNOSIS — F39 Unspecified mood [affective] disorder: Secondary | ICD-10-CM

## 2015-03-21 DIAGNOSIS — Z87891 Personal history of nicotine dependence: Secondary | ICD-10-CM

## 2015-03-21 DIAGNOSIS — K219 Gastro-esophageal reflux disease without esophagitis: Secondary | ICD-10-CM

## 2015-03-21 DIAGNOSIS — G8929 Other chronic pain: Secondary | ICD-10-CM

## 2015-03-21 DIAGNOSIS — M109 Gout, unspecified: Secondary | ICD-10-CM

## 2015-03-21 MED ORDER — COLCHICINE 0.6 MG PO TABS: 0.6000 mg | ORAL_TABLET | Freq: Every day | ORAL | Status: DC | PRN

## 2015-03-21 MED ORDER — HYDROCODONE-ACETAMINOPHEN 10-325 MG PO TABS: 1.0000 | ORAL_TABLET | Freq: Three times a day (TID) | ORAL | Status: DC | PRN

## 2015-03-21 MED ORDER — FLUTICASONE PROPIONATE 50 MCG/ACT NA SUSP: 2.0000 | Freq: Every day | NASAL | Status: DC

## 2015-03-21 MED ORDER — AMITRIPTYLINE HCL 25 MG PO TABS: ORAL_TABLET | ORAL | Status: DC

## 2015-03-21 MED ORDER — SERTRALINE HCL 25 MG PO TABS: 25.0000 mg | ORAL_TABLET | Freq: Every day | ORAL | Status: DC

## 2015-03-21 NOTE — Assessment & Plan Note (Signed)
Reviewed FLP with patient. rec mediterranean diet.

## 2015-03-21 NOTE — Assessment & Plan Note (Signed)
-

## 2015-03-21 NOTE — Assessment & Plan Note (Signed)
Advanced directives: does not have set up. Thinks sister Levada Dy would be HCPOA. Packet provided today

## 2015-03-21 NOTE — Patient Instructions (Addendum)
Advanced directive packet provided today.  Try sertraline 25mg  daily for mood.  We will refer you to lung cancer screening nurse to discuss CT scan.  Good to see you today, call us with questions.  Return as needed or in 6 months for follow up.       Mediterranean Diet  Why follow it? Research shows. . Those who follow the Mediterranean diet have a reduced risk of heart disease  . The diet is associated with a reduced incidence of Parkinson's and Alzheimer's diseases . People following the diet may have longer life expectancies and lower rates of chronic diseases  . The Dietary Guidelines for Americans recommends the Mediterranean diet as an eating plan to promote health and prevent disease  What Is the Mediterranean Diet?  . Healthy eating plan based on typical foods and recipes of Mediterranean-style cooking . The diet is primarily a plant based diet; these foods should make up a majority of meals   Starches - Plant based foods should make up a majority of meals - They are an important sources of vitamins, minerals, energy, antioxidants, and fiber - Choose whole grains, foods high in fiber and minimally processed items  - Typical grain sources include wheat, oats, barley, corn, brown rice, bulgar, farro, millet, polenta, couscous  - Various types of beans include chickpeas, lentils, fava beans, black beans, white beans   Fruits  Veggies - Large quantities of antioxidant rich fruits & veggies; 6 or more servings  - Vegetables can be eaten raw or lightly drizzled with oil and cooked  - Vegetables common to the traditional Mediterranean Diet include: artichokes, arugula, beets, broccoli, brussel sprouts, cabbage, carrots, celery, collard greens, cucumbers, eggplant, kale, leeks, lemons, lettuce, mushrooms, okra, onions, peas, peppers, potatoes, pumpkin, radishes, rutabaga, shallots, spinach, sweet potatoes, turnips, zucchini - Fruits common to the Mediterranean Diet include: apples,  apricots, avocados, cherries, clementines, dates, figs, grapefruits, grapes, melons, nectarines, oranges, peaches, pears, pomegranates, strawberries, tangerines  Fats - Replace butter and margarine with healthy oils, such as olive oil, canola oil, and tahini  - Limit nuts to no more than a handful a day  - Nuts include walnuts, almonds, pecans, pistachios, pine nuts  - Limit or avoid candied, honey roasted or heavily salted nuts - Olives are central to the [employer] - can be eaten whole or used in a variety of dishes   Meats Protein - Limiting red meat: no more than a few times a month - When eating red meat: choose lean cuts and keep the portion to the size of deck of cards - Eggs: approx. 0 to 4 times a week  - Fish and lean poultry: at least 2 a week  - Healthy protein sources include, chicken, [place], lean beef, lamb - Increase intake of seafood such as tuna, salmon, trout, mackerel, shrimp, scallops - Avoid or limit high fat processed meats such as sausage and bacon  Dairy - Include moderate amounts of low fat dairy products  - Focus on healthy dairy such as fat free yogurt, skim milk, low or reduced fat cheese - Limit dairy products higher in fat such as whole or 2% milk, cheese, ice cream  Alcohol - Moderate amounts of red wine is ok  - No more than 5 oz daily for women (all ages) and men older than age 66  - No more than 10 oz of wine daily for men younger than 40  Other - Limit sweets and other desserts  - Use herbs  and spices instead of salt to flavor foods  - Herbs and spices common to the traditional Mediterranean Diet include: basil, bay leaves, chives, cloves, cumin, fennel, garlic, lavender, marjoram, mint, oregano, parsley, pepper, rosemary, sage, savory, sumac, tarragon, thyme   It's not just a diet, it's a lifestyle:  . The Mediterranean diet includes lifestyle factors typical of those in the region  . Foods, drinks and meals are best eaten with others and  savored . Daily physical activity is important for overall good health . This could be strenuous exercise like running and aerobics . This could also be more leisurely activities such as walking, housework, yard-work, or taking the stairs . Moderation is the key; a balanced and healthy diet accommodates most foods and drinks . Consider portion sizes and frequency of consumption of certain foods   Meal Ideas & Options:  . Breakfast:  o Whole wheat toast or whole wheat English muffins with peanut butter & hard boiled egg o Steel cut oats topped with apples & cinnamon and skim milk  o Fresh fruit: banana, strawberries, melon, berries, peaches  o Smoothies: strawberries, bananas, greek yogurt, peanut butter o Low fat greek yogurt with blueberries and granola  o Egg white omelet with spinach and mushrooms o Breakfast couscous: whole wheat couscous, apricots, skim milk, cranberries  . Sandwiches:  o Hummus and grilled vegetables (peppers, zucchini, squash) on whole wheat bread   o Grilled chicken on whole wheat pita with lettuce, tomatoes, cucumbers or tzatziki  o Tuna salad on whole wheat bread: tuna salad made with greek yogurt, olives, red peppers, capers, green onions o Garlic rosemary lamb pita: lamb sauted with garlic, rosemary, salt & pepper; add lettuce, cucumber, greek yogurt to pita - flavor with lemon juice and black pepper  . Seafood:  o Mediterranean grilled salmon, seasoned with garlic, basil, parsley, lemon juice and black pepper o Shrimp, lemon, and spinach whole-grain pasta salad made with low fat greek yogurt  o Seared scallops with lemon orzo  o Seared tuna steaks seasoned salt, pepper, coriander topped with tomato mixture of olives, tomatoes, olive oil, minced garlic, parsley, green onions and cappers  . Meats:  o Herbed greek chicken salad with kalamata olives, cucumber, feta  o Red bell peppers stuffed with spinach, bulgur, lean ground beef (or lentils) & topped with feta    o Kebabs: skewers of chicken, tomatoes, onions, zucchini, squash  o [place] burgers: made with red onions, mint, dill, lemon juice, feta cheese topped with roasted red peppers . Vegetarian o Cucumber salad: cucumbers, artichoke hearts, celery, red onion, feta cheese, tossed in olive oil & lemon juice  o Hummus and whole grain pita points with a greek salad (lettuce, tomato, feta, olives, cucumbers, red onion) o Lentil soup with celery, carrots made with vegetable broth, garlic, salt and pepper  o Tabouli salad: parsley, bulgur, mint, scallions, cucumbers, tomato, radishes, lemon juice, olive oil, salt and pepper.   Health Maintenance, Male A healthy lifestyle and preventative care can promote health and wellness.  Maintain regular health, dental, and eye exams.  Eat a healthy diet. Foods like vegetables, fruits, whole grains, low-fat dairy products, and lean protein foods contain the nutrients you need and are low in calories. Decrease your intake of foods high in solid fats, added sugars, and salt. Get information about a proper diet from your health care provider, if necessary.  Regular physical exercise is one of the most important things you can do for your health. Most adults should  get at least 150 minutes of moderate-intensity exercise (any activity that increases your heart rate and causes you to sweat) each week. In addition, most adults need muscle-strengthening exercises on 2 or more days a week.   Maintain a healthy weight. The body mass index (BMI) is a screening tool to identify possible weight problems. It provides an estimate of body fat based on height and weight. Your health care provider can find your BMI and can help you achieve or maintain a healthy weight. For males 20 years and older:  A BMI below 18.5 is considered underweight.  A BMI of 18.5 to 24.9 is normal.  A BMI of 25 to 29.9 is considered overweight.  A BMI of 30 and above is considered obese.  Maintain  normal blood lipids and cholesterol by exercising and minimizing your intake of saturated fat. Eat a balanced diet with plenty of fruits and vegetables. Blood tests for lipids and cholesterol should begin at age 62 and be repeated every 5 years. If your lipid or cholesterol levels are high, you are over age 62, or you are at high risk for heart disease, you may need your cholesterol levels checked more frequently.Ongoing high lipid and cholesterol levels should be treated with medicines if diet and exercise are not working.  If you smoke, find out from your health care provider how to quit. If you do not use tobacco, do not start.  Lung cancer screening is recommended for adults aged 109-80 years who are at high risk for developing lung cancer because of a history of smoking. A yearly low-dose CT scan of the lungs is recommended for people who have at least a 30-pack-year history of smoking and are current smokers or have quit within the past 15 years. A pack year of smoking is smoking an average of 1 pack of cigarettes a day for 1 year (for example, a 30-pack-year history of smoking could mean smoking 1 pack a day for 30 years or 2 packs a day for 15 years). Yearly screening should continue until the smoker has stopped smoking for at least 15 years. Yearly screening should be stopped for people who develop a health problem that would prevent them from having lung cancer treatment.  If you choose to drink alcohol, do not have more than 2 drinks per day. One drink is considered to be 12 oz (360 mL) of beer, 5 oz (150 mL) of wine, or 1.5 oz (45 mL) of liquor.  Avoid the use of street drugs. Do not share needles with anyone. Ask for help if you need support or instructions about stopping the use of drugs.  High blood pressure causes heart disease and increases the risk of stroke. High blood pressure is more likely to develop in:  People who have blood pressure in the end of the normal range (100-139/85-89  mm Hg).  People who are overweight or obese.  People who are African American.  If you are 76-56 years of age, have your blood pressure checked every 3-5 years. If you are 2 years of age or older, have your blood pressure checked every year. You should have your blood pressure measured twice--once when you are at a hospital or clinic, and once when you are not at a hospital or clinic. Record the average of the two measurements. To check your blood pressure when you are not at a hospital or clinic, you can use:  An automated blood pressure machine at a pharmacy.  A home blood  pressure monitor.  If you are 20-48 years old, ask your health care provider if you should take aspirin to prevent heart disease.  Diabetes screening involves taking a blood sample to check your fasting blood sugar level. This should be done once every 3 years after age 52 if you are at a normal weight and without risk factors for diabetes. Testing should be considered at a younger age or be carried out more frequently if you are overweight and have at least 1 risk factor for diabetes.  Colorectal cancer can be detected and often prevented. Most routine colorectal cancer screening begins at the age of 74 and continues through age 41. However, your health care provider may recommend screening at an earlier age if you have risk factors for colon cancer. On a yearly basis, your health care provider may provide home test kits to check for hidden blood in the stool. A small camera at the end of a tube may be used to directly examine the colon (sigmoidoscopy or colonoscopy) to detect the earliest forms of colorectal cancer. Talk to your health care provider about this at age 33 when routine screening begins. A direct exam of the colon should be repeated every 5-10 years through age 60, unless early forms of precancerous polyps or small growths are found.  People who are at an increased risk for hepatitis B should be screened for  this virus. You are considered at high risk for hepatitis B if:  You were born in a country where hepatitis B occurs often. Talk with your health care provider about which countries are considered high risk.  Your parents were born in a high-risk country and you have not received a shot to protect against hepatitis B (hepatitis B vaccine).  You have HIV or AIDS.  You use needles to inject street drugs.  You live with, or have sex with, someone who has hepatitis B.  You are a man who has sex with other men (MSM).  You get hemodialysis treatment.  You take certain medicines for conditions like cancer, organ transplantation, and autoimmune conditions.  Hepatitis C blood testing is recommended for all people born from [birth year] through 1963 and any individual with known risk factors for hepatitis C.  Healthy men should no longer receive prostate-specific antigen (PSA) blood tests as part of routine cancer screening. Talk to your health care provider about prostate cancer screening.  Testicular cancer screening is not recommended for adolescents or adult males who have no symptoms. Screening includes self-exam, a health care provider exam, and other screening tests. Consult with your health care provider about any symptoms you have or any concerns you have about testicular cancer.  Practice safe sex. Use condoms and avoid high-risk sexual practices to reduce the spread of sexually transmitted infections (STIs).  You should be screened for STIs, including gonorrhea and chlamydia if:  You are sexually active and are younger than 24 years.  You are older than 24 years, and your health care provider tells you that you are at risk for this type of infection.  Your sexual activity has changed since you were last screened, and you are at an increased risk for chlamydia or gonorrhea. Ask your health care provider if you are at risk.  If you are at risk of being infected with HIV, it is recommended  that you take a prescription medicine daily to prevent HIV infection. This is called pre-exposure prophylaxis (PrEP). You are considered at risk if:  You are a  man who has sex with other men (MSM).  You are a heterosexual man who is sexually active with multiple partners.  You take drugs by injection.  You are sexually active with a partner who has HIV.  Talk with your health care provider about whether you are at high risk of being infected with HIV. If you choose to begin PrEP, you should first be tested for HIV. You should then be tested every 3 months for as long as you are taking PrEP.  Use sunscreen. Apply sunscreen liberally and repeatedly throughout the day. You should seek shade when your shadow is shorter than you. Protect yourself by wearing long sleeves, pants, a wide-brimmed hat, and sunglasses year round whenever you are outdoors.  Tell your health care provider of new moles or changes in moles, especially if there is a change in shape or color. Also, tell your health care provider if a mole is larger than the size of a pencil eraser.  A one-time screening for abdominal aortic aneurysm (AAA) and surgical repair of large AAAs by ultrasound is recommended for men aged 48-75 years who are current or former smokers.  Stay current with your vaccines (immunizations).   This information is not intended to replace advice given to you by your health care provider. Make sure you discuss any questions you have with your health care provider.   Document Released: 07/21/2007 Document Revised: 02/12/2014 Document Reviewed: 06/19/2010 Elsevier Interactive Patient Education [employer].

## 2015-03-21 NOTE — Assessment & Plan Note (Signed)
Continue 1/2 tab amitriptyline (whole tab causes oversedation). Add sertraline 25mg  daily.

## 2015-03-21 NOTE — Assessment & Plan Note (Signed)
Continue current pain regimen of celebrex 100mg  daily, gabapentin, tylenol and sparing hydrocodone.
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
## 2015-03-21 NOTE — Progress Notes (Signed)
Pre visit review using our clinic review tool, if applicable. No additional management support is needed unless otherwise documented below in the visit note. 

## 2015-03-21 NOTE — Assessment & Plan Note (Signed)

## 2015-03-21 NOTE — Assessment & Plan Note (Addendum)
Continue current pain regimen of celebrex 100mg  daily, gabapentin, tylenol and sparing hydrocodone.  Low risk patient. Pt states he provided sample for UDS on Friday - no mention in chart. Will await UDS and if not done will need next visit.

## 2015-03-21 NOTE — Assessment & Plan Note (Signed)
Preventative protocols reviewed and updated unless pt declined. Discussed healthy diet and lifestyle.  

## 2015-03-21 NOTE — Assessment & Plan Note (Signed)
Infrequent flares

## 2015-04-06 ENCOUNTER — Other Ambulatory Visit: Payer: Self-pay | Admitting: Acute Care

## 2015-04-06 DIAGNOSIS — Z87891 Personal history of nicotine dependence: Secondary | ICD-10-CM

## 2015-04-12 ENCOUNTER — Encounter: Payer: Self-pay | Admitting: Family Medicine

## 2015-04-15 ENCOUNTER — Ambulatory Visit (INDEPENDENT_AMBULATORY_CARE_PROVIDER_SITE_OTHER): Payer: Medicare Other | Admitting: Acute Care

## 2015-04-15 ENCOUNTER — Encounter: Payer: Self-pay | Admitting: Acute Care

## 2015-04-15 ENCOUNTER — Ambulatory Visit (INDEPENDENT_AMBULATORY_CARE_PROVIDER_SITE_OTHER)
Admission: RE | Admit: 2015-04-15 | Discharge: 2015-04-15 | Disposition: A | Discharge status: Home / Self Care | Payer: Medicare Other | Source: Ambulatory Visit | Attending: Acute Care | Admitting: Acute Care

## 2015-04-15 DIAGNOSIS — Z87891 Personal history of nicotine dependence: Secondary | ICD-10-CM

## 2015-04-15 NOTE — Progress Notes (Signed)
Shared Decision Making Visit Lung Cancer Screening Program [id number])   Eligibility:  Age 60 y.o.  Pack Years Smoking History Calculation 43.5 pack year smoking history (# packs/per year x # years smoked)  Recent History of coughing up blood  no  Unexplained weight loss? no ( >Than 15 pounds within the last 6 months )  Prior History Lung / other cancer no (Diagnosis within the last 5 years already requiring surveillance chest CT Scans).  Smoking Status Former Smoker  Former Smokers: Years since quit: 12 years  Quit Date: 2005  Visit Components:  Discussion included one or more decision making aids. yes  Discussion included risk/benefits of screening. yes  Discussion included potential follow up diagnostic testing for abnormal scans. yes  Discussion included meaning and risk of over diagnosis. yes  Discussion included meaning and risk of False Positives. yes  Discussion included meaning of total radiation exposure. yes  Counseling Included:  Importance of adherence to annual lung cancer LDCT screening. yes  Impact of comorbidities on ability to participate in the program. yes  Ability and willingness to under diagnostic treatment. yes  Smoking Cessation Counseling:  Current Smokers:   Discussed importance of smoking cessation. NA: former smoker  Information about tobacco cessation classes and interventions provided to patient. yes  Patient provided with "ticket" for LDCT Scan. yes  Symptomatic Patient. no  Counseling  Diagnosis Code: Tobacco Use Z72.0  Asymptomatic Patient yes  Counseling Former smoker  Former Smokers:   Discussed the importance of maintaining cigarette abstinence. yes  Diagnosis Code: Personal History of Nicotine Dependence. [id number]  Information about tobacco cessation classes and interventions provided to patient. Yes  Patient provided with "ticket" for LDCT Scan. yes  Written Order for Lung Cancer Screening with LDCT placed in  Epic. Yes (CT Chest Lung Cancer Screening Low Dose W/O CM) [id number] Z12.2-Screening of respiratory organs Z87.891-Personal history of nicotine dependence  I spent 15 minutes of face to face time with Mr. Kolden discussing the risks and benefits of lung cancer screening. We viewed a power point together that explained in detail the above noted topics. We took the time to pause the power point at intervals to allow for questions to be asked and answered to ensure understanding. We discussed that Mr. Cullinane had taken the single most powerful action possible to decrease his risk of developing lung cancer when he quit smoking. I counseled him to remain smoke free, and to contact me if he ever had the desire to smoke again so that I can provide resources and tools to help support the effort to remain smoke free. We discussed the time and location of the scan, and that either [place] or I will call with the results within  24-48 hours of receiving them. Mr. Lantigua has my card and contact information in the event he needs to speak with me, in addition to a copy of the power point we reviewed as a resource. Mr. Juaire verbalized understanding of all of the above and had no further questions upon leaving the office.    Magdalen Spatz, NP  04/15/2015

## 2015-04-18 ENCOUNTER — Other Ambulatory Visit: Payer: Self-pay | Admitting: Acute Care

## 2015-04-18 DIAGNOSIS — Z87891 Personal history of nicotine dependence: Secondary | ICD-10-CM

## 2015-04-19 ENCOUNTER — Other Ambulatory Visit: Payer: Self-pay

## 2015-04-19 MED ORDER — HYDROCODONE-ACETAMINOPHEN 10-325 MG PO TABS: 1.0000 | ORAL_TABLET | Freq: Three times a day (TID) | ORAL | Status: DC | PRN

## 2015-04-19 NOTE — Telephone Encounter (Signed)
Message left advising patient.  

## 2015-04-19 NOTE — Telephone Encounter (Signed)
Attempted to call patient. Phone would ring one time and then immediately ring a quick busy signal. Attempted numerous times with same outcome. Rx placed up front for pick up. Will try again later.

## 2015-04-19 NOTE — Telephone Encounter (Signed)
Pt left v/m requesting rx hydrocodone apap. Call when ready for pick up.pt last seen for annual and last printed # 40 on 03/21/15.

## 2015-04-19 NOTE — Telephone Encounter (Signed)
Printed and in Kim's box 

## 2015-05-23 ENCOUNTER — Other Ambulatory Visit: Payer: Self-pay

## 2015-05-23 MED ORDER — HYDROCODONE-ACETAMINOPHEN 10-325 MG PO TABS: 1.0000 | ORAL_TABLET | Freq: Three times a day (TID) | ORAL | Status: DC | PRN

## 2015-05-23 NOTE — Telephone Encounter (Signed)
Pt left v/m requesting rx hydrocodone apap. Call when ready for pick up. rx last printed # 40 on 04/19/15; pt last annual exam on 03/21/15.

## 2015-05-23 NOTE — Telephone Encounter (Signed)
Printed and in Kim's box 

## 2015-05-23 NOTE — Telephone Encounter (Signed)
Spoke to patient that rx was up front for pickup

## 2015-06-22 ENCOUNTER — Other Ambulatory Visit: Payer: Self-pay | Admitting: *Deleted

## 2015-06-22 NOTE — Telephone Encounter (Signed)
Pt left voicemail at Triage requesting refill of med, last refilled on 05/23/15 #40 with 0 refills

## 2015-06-24 MED ORDER — HYDROCODONE-ACETAMINOPHEN 10-325 MG PO TABS: 1.0000 | ORAL_TABLET | Freq: Three times a day (TID) | ORAL | Status: DC | PRN

## 2015-06-24 NOTE — Telephone Encounter (Signed)
Spoke to pt and informed him Rx is available for pickup at the front desk

## 2015-06-24 NOTE — Telephone Encounter (Signed)
Printed and in Kim's box 

## 2015-07-25 ENCOUNTER — Other Ambulatory Visit: Payer: Self-pay

## 2015-07-25 MED ORDER — HYDROCODONE-ACETAMINOPHEN 10-325 MG PO TABS: 1.0000 | ORAL_TABLET | Freq: Three times a day (TID) | ORAL | Status: DC | PRN

## 2015-07-25 NOTE — Telephone Encounter (Signed)
Printed and in Kim's box 

## 2015-07-25 NOTE — Telephone Encounter (Signed)
Pt left v/m requesting rx hydrocodone apap. Call when ready for pick up. rx last printed # 40 on 06/24/15. Last seen for annual exam on 03/21/15.

## 2015-07-25 NOTE — Telephone Encounter (Signed)
Patient notified and Rx placed up front for pick up. 

## 2015-08-25 ENCOUNTER — Other Ambulatory Visit: Payer: Self-pay

## 2015-08-25 NOTE — Telephone Encounter (Signed)
Pt left v/m requesting rx hydrocodone apap. Call when ready for pick up. rx last printed # 40 on 07/25/15. Last annual exam on 03/21/15.

## 2015-08-26 MED ORDER — HYDROCODONE-ACETAMINOPHEN 10-325 MG PO TABS: 1.0000 | ORAL_TABLET | Freq: Three times a day (TID) | ORAL | Status: DC | PRN

## 2015-08-26 NOTE — Telephone Encounter (Signed)
printed and in Kim's box. 

## 2015-08-26 NOTE — Telephone Encounter (Signed)
Message left advising patient and Rx placed up front for pick up. 

## 2015-09-10 ENCOUNTER — Other Ambulatory Visit: Payer: Self-pay | Admitting: Family Medicine

## 2015-09-27 ENCOUNTER — Other Ambulatory Visit: Payer: Self-pay

## 2015-09-27 MED ORDER — HYDROCODONE-ACETAMINOPHEN 10-325 MG PO TABS: 1.0000 | ORAL_TABLET | Freq: Three times a day (TID) | ORAL | 0 refills | Status: DC | PRN

## 2015-09-27 NOTE — Telephone Encounter (Signed)
Printed and in Kim's box 

## 2015-09-27 NOTE — Telephone Encounter (Signed)
Pt left v/m requesting rx hydrocodone apap. Call when ready for pick up. Last printed #40 on 08/26/15; last annual seen 03/21/15.

## 2015-09-28 NOTE — Telephone Encounter (Signed)
Patient notified and Rx placed up front for pick up. 

## 2015-10-11 ENCOUNTER — Other Ambulatory Visit: Payer: Self-pay | Admitting: Family Medicine

## 2015-10-28 ENCOUNTER — Other Ambulatory Visit: Payer: Self-pay

## 2015-10-28 MED ORDER — HYDROCODONE-ACETAMINOPHEN 10-325 MG PO TABS: 1.0000 | ORAL_TABLET | Freq: Three times a day (TID) | ORAL | 0 refills | Status: DC | PRN

## 2015-10-28 NOTE — Telephone Encounter (Signed)
Printed and in Kim's box 

## 2015-10-28 NOTE — Telephone Encounter (Signed)
Pt left v/m requesting rx hydrocodone apap. Call when ready for pick up. rx last printed # 40 on 09/27/15. Pt last seen 03/21/15 for annual exam.

## 2015-10-31 NOTE — Telephone Encounter (Signed)
Patient notified and Rx placed up front for pick up. 

## 2015-11-14 ENCOUNTER — Other Ambulatory Visit: Payer: Self-pay | Admitting: Family Medicine

## 2015-11-29 ENCOUNTER — Other Ambulatory Visit: Payer: Self-pay

## 2015-11-29 MED ORDER — HYDROCODONE-ACETAMINOPHEN 10-325 MG PO TABS: 1.0000 | ORAL_TABLET | Freq: Three times a day (TID) | ORAL | 0 refills | Status: DC | PRN

## 2015-11-29 NOTE — Telephone Encounter (Signed)
Pt left v/m requesting rx hydrocodone apap. Call when ready for pick up. Last printed # 40 on 10/28/15. Pt last seen annual 03/21/15.

## 2015-11-29 NOTE — Telephone Encounter (Signed)
Patient notified and Rx placed up front for pick up. 

## 2016-01-02 ENCOUNTER — Other Ambulatory Visit: Payer: Self-pay

## 2016-01-02 NOTE — Telephone Encounter (Signed)
Pt left v/m requesting rx hydrocodone apap. Call when ready for pick up. rx last printed # 40 on 11/29/15. Last annual 03/21/15.

## 2016-01-03 MED ORDER — HYDROCODONE-ACETAMINOPHEN 10-325 MG PO TABS: 1.0000 | ORAL_TABLET | Freq: Three times a day (TID) | ORAL | 0 refills | Status: DC | PRN

## 2016-01-03 NOTE — Telephone Encounter (Signed)
Printed and in Kim's box 

## 2016-01-03 NOTE — Telephone Encounter (Signed)
LMOM for pt to pick up Rx at front desk.

## 2016-02-01 ENCOUNTER — Other Ambulatory Visit: Payer: Self-pay

## 2016-02-01 MED ORDER — HYDROCODONE-ACETAMINOPHEN 10-325 MG PO TABS: 1.0000 | ORAL_TABLET | Freq: Three times a day (TID) | ORAL | 0 refills | Status: DC | PRN

## 2016-02-01 NOTE — Telephone Encounter (Signed)
Printed and in Kim's box 

## 2016-02-01 NOTE — Telephone Encounter (Signed)
Patient notified and Rx placed up front for pick up. 

## 2016-02-01 NOTE — Telephone Encounter (Signed)
Pt left v/m requesting rx hydrocodone apap. Call when ready for pick up. rx last printed # 40 on 01/03/16 and last seen 03/21/15 for annual exam.

## 2016-02-12 ENCOUNTER — Other Ambulatory Visit: Payer: Self-pay | Admitting: Family Medicine

## 2016-02-13 NOTE — Telephone Encounter (Signed)
Last refill 03/21/15 #30 +6, last OV 03/21/15. Ok to refill?

## 2016-03-05 ENCOUNTER — Other Ambulatory Visit: Payer: Self-pay

## 2016-03-05 NOTE — Telephone Encounter (Signed)
Pt left v/m requesting rx hydrocodone apap. Call when ready for pick up. Last printed # 40 on 02/01/16 and pt last annual 03/21/15; no future appt scheduled.

## 2016-03-06 MED ORDER — HYDROCODONE-ACETAMINOPHEN 10-325 MG PO TABS: 1.0000 | ORAL_TABLET | Freq: Three times a day (TID) | ORAL | 0 refills | Status: DC | PRN

## 2016-03-06 NOTE — Telephone Encounter (Signed)
Printed and in Kim's box 

## 2016-03-07 NOTE — Telephone Encounter (Signed)
rx placed at front desk for pick up, pt aware.

## 2016-04-05 DIAGNOSIS — R911 Solitary pulmonary nodule: Secondary | ICD-10-CM

## 2016-04-05 DIAGNOSIS — I700 Atherosclerosis of aorta: Secondary | ICD-10-CM

## 2016-04-05 DIAGNOSIS — J439 Emphysema, unspecified: Secondary | ICD-10-CM

## 2016-04-05 DIAGNOSIS — I2510 Atherosclerotic heart disease of native coronary artery without angina pectoris: Secondary | ICD-10-CM | POA: Insufficient documentation

## 2016-04-05 DIAGNOSIS — R918 Other nonspecific abnormal finding of lung field: Secondary | ICD-10-CM | POA: Insufficient documentation

## 2016-04-05 HISTORY — DX: Atherosclerosis of aorta: I70.0

## 2016-04-05 HISTORY — DX: Atherosclerotic heart disease of native coronary artery without angina pectoris: I25.10

## 2016-04-05 HISTORY — DX: Solitary pulmonary nodule: R91.1

## 2016-04-05 HISTORY — DX: Emphysema, unspecified: J43.9

## 2016-04-06 ENCOUNTER — Other Ambulatory Visit: Payer: Self-pay

## 2016-04-06 MED ORDER — HYDROCODONE-ACETAMINOPHEN 10-325 MG PO TABS: 1.0000 | ORAL_TABLET | Freq: Three times a day (TID) | ORAL | 0 refills | Status: DC | PRN

## 2016-04-06 NOTE — Telephone Encounter (Signed)
Pt left v/m requesting rx hydrocodone apap. Call when ready for pick up. Last printed # 40 on 03/06/16. Last annual 03/21/15; no future appt scheduled.

## 2016-04-06 NOTE — Telephone Encounter (Signed)
Printed and in [place]' box. plz schedule CPE

## 2016-04-09 NOTE — Telephone Encounter (Signed)
Patient notified and Rx placed up front for pick up. 

## 2016-04-12 ENCOUNTER — Other Ambulatory Visit: Payer: Self-pay | Admitting: Family Medicine

## 2016-04-19 ENCOUNTER — Ambulatory Visit (INDEPENDENT_AMBULATORY_CARE_PROVIDER_SITE_OTHER)
Admission: RE | Admit: 2016-04-19 | Discharge: 2016-04-19 | Disposition: A | Discharge status: Home / Self Care | Payer: Medicare HMO | Source: Ambulatory Visit | Attending: Acute Care | Admitting: Acute Care

## 2016-04-19 DIAGNOSIS — Z87891 Personal history of nicotine dependence: Secondary | ICD-10-CM

## 2016-04-25 ENCOUNTER — Other Ambulatory Visit: Payer: Self-pay | Admitting: Acute Care

## 2016-04-25 DIAGNOSIS — Z87891 Personal history of nicotine dependence: Secondary | ICD-10-CM

## 2016-04-29 ENCOUNTER — Encounter: Payer: Self-pay | Admitting: Family Medicine

## 2016-05-10 ENCOUNTER — Other Ambulatory Visit: Payer: Self-pay

## 2016-05-10 NOTE — Telephone Encounter (Signed)
Pt left v/m requesting rx hydrocodone apap. Call when ready for pick up. rx last printed # 40 on 04/06/16; last annual 03/21/15 and scheduled CPX on 06/08/16.

## 2016-05-11 MED ORDER — HYDROCODONE-ACETAMINOPHEN 10-325 MG PO TABS: 1.0000 | ORAL_TABLET | Freq: Three times a day (TID) | ORAL | 0 refills | Status: DC | PRN

## 2016-05-11 NOTE — Telephone Encounter (Signed)
Printed and in [facility]'box  

## 2016-05-13 ENCOUNTER — Other Ambulatory Visit: Payer: Self-pay | Admitting: Family Medicine

## 2016-05-14 NOTE — Telephone Encounter (Signed)
Patient notified and Rx placed up front for pick up. 

## 2016-06-02 ENCOUNTER — Other Ambulatory Visit: Payer: Self-pay | Admitting: Family Medicine

## 2016-06-02 DIAGNOSIS — M109 Gout, unspecified: Secondary | ICD-10-CM

## 2016-06-02 DIAGNOSIS — E785 Hyperlipidemia, unspecified: Secondary | ICD-10-CM

## 2016-06-02 DIAGNOSIS — Z125 Encounter for screening for malignant neoplasm of prostate: Secondary | ICD-10-CM

## 2016-06-06 ENCOUNTER — Other Ambulatory Visit (INDEPENDENT_AMBULATORY_CARE_PROVIDER_SITE_OTHER): Payer: Medicare HMO

## 2016-06-06 DIAGNOSIS — Z125 Encounter for screening for malignant neoplasm of prostate: Secondary | ICD-10-CM | POA: Diagnosis not present

## 2016-06-06 DIAGNOSIS — E785 Hyperlipidemia, unspecified: Secondary | ICD-10-CM

## 2016-06-06 DIAGNOSIS — M109 Gout, unspecified: Secondary | ICD-10-CM | POA: Diagnosis not present

## 2016-06-06 LAB — LIPID PANEL
CHOLESTEROL: 224 mg/dL — AB (ref 0–200)
HDL: 58.9 mg/dL (ref >39.00)
LDL CALC: 149 mg/dL — AB (ref 0–99)
NonHDL: 164.65
TRIGLYCERIDES: 79 mg/dL (ref 0.0–149.0)
Total CHOL/HDL Ratio: 4
VLDL: 15.8 mg/dL (ref 0.0–40.0)

## 2016-06-06 LAB — COMPREHENSIVE METABOLIC PANEL
ALBUMIN: 4.4 g/dL (ref 3.5–5.2)
ALT: 22 U/L (ref 0–53)
AST: 25 U/L (ref 0–37)
Alkaline Phosphatase: 65 U/L (ref 39–117)
BILIRUBIN TOTAL: 0.6 mg/dL (ref 0.2–1.2)
BUN: 9 mg/dL (ref 6–23)
CALCIUM: 9.5 mg/dL (ref 8.4–10.5)
CHLORIDE: 104 meq/L (ref 96–112)
CO2: 29 mEq/L (ref 19–32)
CREATININE: 1.13 mg/dL (ref 0.40–1.50)
GFR: 85.59 mL/min (ref >60.00)
Glucose, Bld: 100 mg/dL — ABNORMAL HIGH (ref 70–99)
Potassium: 3.9 mEq/L (ref 3.5–5.1)
SODIUM: 140 meq/L (ref 135–145)
TOTAL PROTEIN: 7.2 g/dL (ref 6.0–8.3)

## 2016-06-06 LAB — URIC ACID: Uric Acid, Serum: 7.8 mg/dL (ref 4.0–7.8)

## 2016-06-06 LAB — PSA: PSA: 0.82 ng/mL (ref 0.10–4.00)

## 2016-06-08 ENCOUNTER — Ambulatory Visit (INDEPENDENT_AMBULATORY_CARE_PROVIDER_SITE_OTHER): Payer: Medicare HMO | Admitting: Family Medicine

## 2016-06-08 ENCOUNTER — Encounter: Payer: Self-pay | Admitting: Radiology

## 2016-06-08 ENCOUNTER — Encounter: Payer: Self-pay | Admitting: Family Medicine

## 2016-06-08 VITALS — BP 132/80 | HR 78 | Temp 98.5°F | Ht 73.5 in | Wt 248.1 lb

## 2016-06-08 DIAGNOSIS — M25562 Pain in left knee: Secondary | ICD-10-CM

## 2016-06-08 DIAGNOSIS — E785 Hyperlipidemia, unspecified: Secondary | ICD-10-CM

## 2016-06-08 DIAGNOSIS — Z0000 Encounter for general adult medical examination without abnormal findings: Secondary | ICD-10-CM

## 2016-06-08 DIAGNOSIS — M109 Gout, unspecified: Secondary | ICD-10-CM | POA: Diagnosis not present

## 2016-06-08 DIAGNOSIS — I700 Atherosclerosis of aorta: Secondary | ICD-10-CM | POA: Diagnosis not present

## 2016-06-08 DIAGNOSIS — J439 Emphysema, unspecified: Secondary | ICD-10-CM | POA: Diagnosis not present

## 2016-06-08 DIAGNOSIS — E669 Obesity, unspecified: Secondary | ICD-10-CM

## 2016-06-08 DIAGNOSIS — I2510 Atherosclerotic heart disease of native coronary artery without angina pectoris: Secondary | ICD-10-CM | POA: Diagnosis not present

## 2016-06-08 DIAGNOSIS — G8929 Other chronic pain: Secondary | ICD-10-CM

## 2016-06-08 DIAGNOSIS — M545 Low back pain, unspecified: Secondary | ICD-10-CM

## 2016-06-08 DIAGNOSIS — M25512 Pain in left shoulder: Secondary | ICD-10-CM

## 2016-06-08 DIAGNOSIS — K219 Gastro-esophageal reflux disease without esophagitis: Secondary | ICD-10-CM | POA: Diagnosis not present

## 2016-06-08 DIAGNOSIS — S46002D Unspecified injury of muscle(s) and tendon(s) of the rotator cuff of left shoulder, subsequent encounter: Secondary | ICD-10-CM | POA: Insufficient documentation

## 2016-06-08 MED ORDER — PRAVASTATIN SODIUM 40 MG PO TABS: 40.0000 mg | ORAL_TABLET | Freq: Every day | ORAL | 3 refills | Status: DC

## 2016-06-08 NOTE — Assessment & Plan Note (Signed)
Denies respiratory symptoms, not on respiratory medication at this time.

## 2016-06-08 NOTE — Assessment & Plan Note (Signed)
Not regularly using colchicine (last filled 2017)

## 2016-06-08 NOTE — Progress Notes (Signed)
BP 132/80   Pulse 78   Temp 98.5 F (36.9 C) (Oral)   Ht 6' 1.5" (1.867 m)   Wt 248 lb 1.9 oz (112.5 kg)   SpO2 97%   BMI 32.29 kg/m    CC: CPE Subjective:    Patient ID: Rodney Charles, male    DOB: [birth year], 61 y.o.   MRN: [id number]  HPI: Rodney Charles is a 61 y.o. male presenting on 06/08/2016 for Annual Exam; Shoulder Pain (left x 1 month, denies apparent injury and swelling. Pt states heat, pain medication, and rest help); and Knee Pain (left x  3 weeks, denies apparent injury and swelling. Pt states heat, pain medication, and rest help)   Denies significant GERD symptoms with nexium on board. Worsening L shoulder and L knee pain over last 3-4 wks. Denies inciting trauma/injury.   Chronic lower back pain - uses TENS unit as well as biofreeze, celebrex, tylenol, hydrocodone, gabapentin, amitriptyline. Uses hydrocodone sparingly.  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 Last year endorsed he would f/u. Encouraged he call Dr Rodney Charles office to schedule repeat colonoscopy.  Prostate cancer screening - discussed, would like screening. No fmhx prostates cancer. Nocturia 2x/night. Strong stream. Lung cancer screening - has had 2 CT screens.  Flu shot yearly Tdap 09/2012 Advanced directives: does not have set up. Thinks sister Rodney Charles would be HCPOA. Packet provided today  Seat belt use discussed Sunscreen use discussed. No changing moles on skin Ex smoker quit 2005 Alcohol - none  Lives alone, dog  Occupation: disability for lumbar and neck pain (~2007) Handicap placard - unable to walk >262ft w/o stopping to rest Edu: HS  Activity: some walking, limited by pain Diet: good water, fruits/vegetables daily  Relevant past medical, surgical, family and social history reviewed and updated as indicated. Interim medical history since our last visit reviewed. Allergies and medications reviewed and updated. Outpatient Medications Prior  to Visit  Medication Sig Dispense Refill  . acetaminophen (TYLENOL) 500 MG tablet Take 500 mg by mouth every 6 (six) hours as needed.    Marland Kitchen amitriptyline (ELAVIL) 25 MG tablet TAKE 1/2 TO 1 TABLET BY MOUTH EVERY NIGHT AT BEDTIME 90 tablet 0  . celecoxib (CELEBREX) 100 MG capsule TAKE 1 CAPSULE(100 MG) BY MOUTH DAILY 90 capsule 1  . colchicine 0.6 MG tablet Take 1 tablet (0.6 mg total) by mouth daily as needed (joint pain). 30 tablet 1  . esomeprazole (NEXIUM) 40 MG capsule Take 40 mg by mouth daily before breakfast.    . fluticasone (FLONASE) 50 MCG/ACT nasal spray Place 2 sprays into both nostrils daily. 16 g 11  . gabapentin (NEURONTIN) 300 MG capsule TAKE 1 CAPSULE(300 MG) BY MOUTH THREE TIMES DAILY 270 capsule 0  . HYDROcodone-acetaminophen (NORCO) 10-325 MG tablet Take 1 tablet by mouth every 8 (eight) hours as needed. 40 tablet 0  . Menthol, Topical Analgesic, (BIOFREEZE EX) Apply topically as directed.    . Multiple Vitamin (MULTIVITAMIN) tablet Take 1 tablet by mouth daily.    . sertraline (ZOLOFT) 25 MG tablet TAKE 1 TABLET(25 MG) BY MOUTH DAILY 30 tablet 3   No facility-administered medications prior to visit.      Per HPI unless specifically indicated in ROS section below Review of Systems  Constitutional: Negative for activity change, appetite change, chills, fatigue, fever and unexpected weight change.  HENT: Negative for hearing loss.   Eyes: Negative for visual disturbance.  Respiratory: Negative for cough, chest tightness,  shortness of breath and wheezing.   Cardiovascular: Negative for chest pain, palpitations and leg swelling.  Gastrointestinal: Negative for abdominal distention, abdominal pain, blood in stool, constipation, diarrhea, nausea and vomiting.  Genitourinary: Negative for difficulty urinating and hematuria.  Musculoskeletal: Negative for arthralgias, myalgias and neck pain.  Skin: Negative for rash.  Neurological: Negative for dizziness, seizures, syncope and  headaches.  Hematological: Negative for adenopathy. Does not bruise/bleed easily.  Psychiatric/Behavioral: Negative for dysphoric mood (occasional). The patient is not nervous/anxious.        Objective:    BP 132/80   Pulse 78   Temp 98.5 F (36.9 C) (Oral)   Ht 6' 1.5" (1.867 m)   Wt 248 lb 1.9 oz (112.5 kg)   SpO2 97%   BMI 32.29 kg/m   Wt Readings from Last 3 Encounters:  06/08/16 248 lb 1.9 oz (112.5 kg)  03/21/15 256 lb 8 oz (116.3 kg)  09/17/14 253 lb 8 oz (115 kg)    Physical Exam  Constitutional: He is oriented to person, place, and time. He appears well-developed and well-nourished. No distress.  HENT:  Head: Normocephalic and atraumatic.  Right Ear: Hearing, tympanic membrane, external ear and ear canal normal.  Left Ear: Hearing, tympanic membrane, external ear and ear canal normal.  Nose: Nose normal.  Mouth/Throat: Uvula is midline, oropharynx is clear and moist and mucous membranes are normal. No oropharyngeal exudate, posterior oropharyngeal edema or posterior oropharyngeal erythema.  Eyes: Conjunctivae and EOM are normal. Pupils are equal, round, and reactive to light. No scleral icterus.  Neck: Normal range of motion. Neck supple. No thyromegaly present.  Cardiovascular: Normal rate, regular rhythm, normal heart sounds and intact distal pulses.   No murmur heard. Pulses:      Radial pulses are 2+ on the right side, and 2+ on the left side.  Pulmonary/Chest: Effort normal and breath sounds normal. No respiratory distress. He has no wheezes. He has no rales.  Abdominal: Soft. Bowel sounds are normal. He exhibits no distension and no mass. There is no tenderness. There is no rebound and no guarding.  Genitourinary: Rectum normal and prostate normal. Rectal exam shows no external hemorrhoid, no fissure, no mass, no tenderness and anal tone normal. Prostate is not enlarged (20gm) and not tender.  Musculoskeletal: Normal range of motion. He exhibits no edema.  L  knee - crepitus and tenderness along patellar tendon L shoulder tender to palpation anterior and posterior shoulder with FROM in abduction and forward flexion  Lymphadenopathy:    He has no cervical adenopathy.  Neurological: He is alert and oriented to person, place, and time.  CN grossly intact, station and gait intact  Skin: Skin is warm and dry. No rash noted.  Psychiatric: He has a normal mood and affect. His behavior is normal. Judgment and thought content normal.  Nursing note and vitals reviewed.  Results for orders placed or performed in visit on 06/06/16  Lipid panel  Result Value Ref Range   Cholesterol 224 (H) 0 - 200 mg/dL   Triglycerides 79.0 0.0 - 149.0 mg/dL   HDL 58.90 >39.00 mg/dL   VLDL 15.8 0.0 - 40.0 mg/dL   LDL Cholesterol 149 (H) 0 - 99 mg/dL   Total CHOL/HDL Ratio 4    NonHDL 164.65   Comprehensive metabolic panel  Result Value Ref Range   Sodium 140 135 - 145 mEq/L   Potassium 3.9 3.5 - 5.1 mEq/L   Chloride 104 96 - 112 mEq/L   CO2  29 19 - 32 mEq/L   Glucose, Bld 100 (H) 70 - 99 mg/dL   BUN 9 6 - 23 mg/dL   Creatinine, Ser 1.13 0.40 - 1.50 mg/dL   Total Bilirubin 0.6 0.2 - 1.2 mg/dL   Alkaline Phosphatase 65 39 - 117 U/L   AST 25 0 - 37 U/L   ALT 22 0 - 53 U/L   Total Protein 7.2 6.0 - 8.3 g/dL   Albumin 4.4 3.5 - 5.2 g/dL   Calcium 9.5 8.4 - 10.5 mg/dL   GFR 85.59 >60.00 mL/min  PSA  Result Value Ref Range   PSA 0.82 0.10 - 4.00 ng/mL  Uric acid  Result Value Ref Range   Uric Acid, Serum 7.8 4.0 - 7.8 mg/dL      Assessment & Plan:   Problem List Items Addressed This Visit    Aortic atherosclerosis ([place])    Discussed with patient, rec start statin. Consider aspirin.      Relevant Medications   pravastatin (PRAVACHOL) 40 MG tablet   CAD (coronary artery disease)    Discussed with patient. Will start statin. Consider aspirin.       Relevant Medications   pravastatin (PRAVACHOL) 40 MG tablet   Chronic lower back pain    Continue  current pain regimen, update UDS.       Emphysema of lung ([place])    Denies respiratory symptoms, not on respiratory medication at this time.       Encounter for chronic pain management    Update UDS today. [place] CSRS reviewed today.  Continue current pain regimen.  No concerns regarding controlled substance usage.       GERD (gastroesophageal reflux disease)    Chronic, stable on nexium. Denies breakthrough symptoms.       Gout    Not regularly using colchicine (last filled 2017)      Health maintenance examination - Primary    Preventative protocols reviewed and updated unless pt declined. Discussed healthy diet and lifestyle.       HLD (hyperlipidemia)    Chronic. Reviewed FLP with patient - new goal LDL <100 given finding on CT scans. Will start pravastatin 40mg  daily - discussed monitoring for myalgias. Pt agrees with plan.      Relevant Medications   pravastatin (PRAVACHOL) 40 MG tablet   Left knee pain    Tender at left patellar tendon with some crepitus - anticipate patellar tendonitis. Continue ice, celebrex, provided with exercises on jumper's knee from [facility] pt advisor. Update if not improving with treatment.       Left shoulder pain    Tender at left anterior and posterior shoulder - anticipate shoulder bursitis. Continue ice, celebrex, provided with exercises on shoulder bursitis from [facility] pt advisor. Update if not improving with treatment.       Obesity, Class I, BMI 30-34.9    Activity limited by chronic pain - but he stays as active as able, walks around [facility] regularly.          Follow up plan: Return in about 1 year (around 06/08/2017), or if symptoms worsen or fail to improve, for annual exam, prior fasting for blood work.  Ria Bush, MD

## 2016-06-08 NOTE — Assessment & Plan Note (Signed)
Continue current pain regimen, update UDS.

## 2016-06-08 NOTE — Assessment & Plan Note (Signed)
Discussed with patient, rec start statin. Consider aspirin.

## 2016-06-08 NOTE — Assessment & Plan Note (Signed)
Activity limited by chronic pain - but he stays as active as able, walks around local senior center regularly.

## 2016-06-08 NOTE — Assessment & Plan Note (Signed)
Tender at left anterior and posterior shoulder - anticipate shoulder bursitis. Continue ice, celebrex, provided with exercises on shoulder bursitis from [facility] pt advisor. Update if not improving with treatment.

## 2016-06-08 NOTE — Assessment & Plan Note (Signed)
Update UDS today. [place] CSRS reviewed today.  Continue current pain regimen.  No concerns regarding controlled substance usage.

## 2016-06-08 NOTE — Assessment & Plan Note (Signed)
Preventative protocols reviewed and updated unless pt declined. Discussed healthy diet and lifestyle.  

## 2016-06-08 NOTE — Assessment & Plan Note (Signed)
Chronic, stable on nexium. Denies breakthrough symptoms.

## 2016-06-08 NOTE — Assessment & Plan Note (Signed)
Chronic. Reviewed FLP with patient - new goal LDL <100 given finding on CT scans. Will start pravastatin 40mg  daily - discussed monitoring for myalgias. Pt agrees with plan.

## 2016-06-08 NOTE — Assessment & Plan Note (Signed)
Tender at left patellar tendon with some crepitus - anticipate patellar tendonitis. Continue ice, celebrex, provided with exercises on jumper's knee from [facility] pt advisor. Update if not improving with treatment.

## 2016-06-08 NOTE — Assessment & Plan Note (Signed)
Discussed with patient. Will start statin. Consider aspirin.

## 2016-06-08 NOTE — Patient Instructions (Addendum)
Update urine drug screen today. Call Dr Lorie Apley office to ask about colonoscopy.  Let's start pravastatin 40mg  cholesterol medicine to help keep cholesterol levels at goal.  I think you have jumper's knee or patellar tendonitis I think you have shoulder bursitis. Exercises for both provided today. Return if not improving with treatment.  Continue current medicines. Return as needed or in 1 year for next physical  Health Maintenance, Male A healthy lifestyle and preventive care is important for your health and wellness. Ask your health care provider about what schedule of regular examinations is right for you. What should I know about weight and diet?  Eat a Healthy Diet  Eat plenty of vegetables, fruits, whole grains, low-fat dairy products, and lean protein.  Do not eat a lot of foods high in solid fats, added sugars, or salt. Maintain a Healthy Weight  Regular exercise can help you achieve or maintain a healthy weight. You should:  Do at least 150 minutes of exercise each week. The exercise should increase your heart rate and make you sweat (moderate-intensity exercise).  Do strength-training exercises at least twice a week. Watch Your Levels of Cholesterol and Blood Lipids  Have your blood tested for lipids and cholesterol every 5 years starting at 59 years of age. If you are at high risk for heart disease, you should start having your blood tested when you are 59 years old. You may need to have your cholesterol levels checked more often if:  Your lipid or cholesterol levels are high.  You are older than 59 years of age.  You are at high risk for heart disease. What should I know about cancer screening? Many types of cancers can be detected early and may often be prevented. Lung Cancer  You should be screened every year for lung cancer if:  You are a current smoker who has smoked for at least 30 years.  You are a former smoker who has quit within the past 15 years.  Talk  to your health care provider about your screening options, when you should start screening, and how often you should be screened. Colorectal Cancer  Routine colorectal cancer screening usually begins at 59 years of age and should be repeated every 5-10 years until you are 59 years old. You may need to be screened more often if early forms of precancerous polyps or small growths are found. Your health care provider may recommend screening at an earlier age if you have risk factors for colon cancer.  Your health care provider may recommend using home test kits to check for hidden blood in the stool.  A small camera at the end of a tube can be used to examine your colon (sigmoidoscopy or colonoscopy). This checks for the earliest forms of colorectal cancer. Prostate and Testicular Cancer  Depending on your age and overall health, your health care provider may do certain tests to screen for prostate and testicular cancer.  Talk to your health care provider about any symptoms or concerns you have about testicular or prostate cancer. Skin Cancer  Check your skin from head to toe regularly.  Tell your health care provider about any new moles or changes in moles, especially if:  There is a change in a mole's size, shape, or color.  You have a mole that is larger than a pencil eraser.  Always use sunscreen. Apply sunscreen liberally and repeat throughout the day.  Protect yourself by wearing long sleeves, pants, a wide-brimmed hat, and sunglasses when  outside. What should I know about heart disease, diabetes, and high blood pressure?  If you are 35-38 years of age, have your blood pressure checked every 3-5 years. If you are 95 years of age or older, have your blood pressure checked every year. You should have your blood pressure measured twice-once when you are at a hospital or clinic, and once when you are not at a hospital or clinic. Record the average of the two measurements. To check your  blood pressure when you are not at a hospital or clinic, you can use:  An automated blood pressure machine at a pharmacy.  A home blood pressure monitor.  Talk to your health care provider about your target blood pressure.  If you are between 73-44 years old, ask your health care provider if you should take aspirin to prevent heart disease.  Have regular diabetes screenings by checking your fasting blood sugar level.  If you are at a normal weight and have a low risk for diabetes, have this test once every three years after the age of 98.  If you are overweight and have a high risk for diabetes, consider being tested at a younger age or more often.  A one-time screening for abdominal aortic aneurysm (AAA) by ultrasound is recommended for men aged 82-75 years who are current or former smokers. What should I know about preventing infection? Hepatitis B  If you have a higher risk for hepatitis B, you should be screened for this virus. Talk with your health care provider to find out if you are at risk for hepatitis B infection. Hepatitis C  Blood testing is recommended for:  Everyone born from [birth year] through 1963.  Anyone with known risk factors for hepatitis C. Sexually Transmitted Diseases (STDs)  You should be screened each year for STDs including gonorrhea and chlamydia if:  You are sexually active and are younger than 59 years of age.  You are older than 59 years of age and your health care provider tells you that you are at risk for this type of infection.  Your sexual activity has changed since you were last screened and you are at an increased risk for chlamydia or gonorrhea. Ask your health care provider if you are at risk.  Talk with your health care provider about whether you are at high risk of being infected with HIV. Your health care provider may recommend a prescription medicine to help prevent HIV infection. What else can I do?  Schedule regular health, dental, and  eye exams.  Stay current with your vaccines (immunizations).  Do not use any tobacco products, such as cigarettes, chewing tobacco, and e-cigarettes. If you need help quitting, ask your health care provider.  Limit alcohol intake to no more than 2 drinks per day. One drink equals 12 ounces of beer, 5 ounces of wine, or 1 ounces of hard liquor.  Do not use street drugs.  Do not share needles.  Ask your health care provider for help if you need support or information about quitting drugs.  Tell your health care provider if you often feel depressed.  Tell your health care provider if you have ever been abused or do not feel safe at home. This information is not intended to replace advice given to you by your health care provider. Make sure you discuss any questions you have with your health care provider. Document Released: 07/21/2007 Document Revised: 09/21/2015 Document Reviewed: 10/26/2014 Elsevier Interactive Patient Education  2017 [employer].

## 2016-06-11 ENCOUNTER — Encounter: Payer: Self-pay | Admitting: Family Medicine

## 2016-06-11 DIAGNOSIS — Z79891 Long term (current) use of opiate analgesic: Secondary | ICD-10-CM | POA: Diagnosis not present

## 2016-06-12 ENCOUNTER — Telehealth: Payer: Self-pay

## 2016-06-12 ENCOUNTER — Encounter: Payer: Self-pay | Admitting: Family Medicine

## 2016-06-12 NOTE — Telephone Encounter (Signed)
Patient called back. I let patient know Dr.G's comments.

## 2016-06-12 NOTE — Telephone Encounter (Signed)
Let's hold pravastatin for 1-2 wks and see if symptoms improve.  Then would have him try every other day dosing to see if better tolerated.

## 2016-06-12 NOTE — Telephone Encounter (Signed)
Pt left v/m; pt was seen 06/08/16 and was started on pravastatin; pt was to call if problems with med; pt having h/a, stomach pain, cough and stuffy nose started 06/11/16. Pt request cb with what to do about taking med.

## 2016-06-13 ENCOUNTER — Other Ambulatory Visit: Payer: Self-pay

## 2016-06-13 NOTE — Telephone Encounter (Signed)
Pt left v/m requesting rx hydrocodone apap. Call when ready for pick up. Last printed # 40 on 05/11/16; last seen 06/08/16.

## 2016-06-14 MED ORDER — HYDROCODONE-ACETAMINOPHEN 10-325 MG PO TABS: 1.0000 | ORAL_TABLET | Freq: Three times a day (TID) | ORAL | 0 refills | Status: DC | PRN

## 2016-06-14 NOTE — Telephone Encounter (Signed)
Printed and in CMA box 

## 2016-06-14 NOTE — Telephone Encounter (Signed)
Patient notified rx is ready and placed at front desk

## 2016-06-28 ENCOUNTER — Ambulatory Visit: Payer: Medicare HMO | Admitting: Family Medicine

## 2016-07-03 ENCOUNTER — Ambulatory Visit (INDEPENDENT_AMBULATORY_CARE_PROVIDER_SITE_OTHER): Payer: Medicare HMO | Admitting: Family Medicine

## 2016-07-03 ENCOUNTER — Encounter: Payer: Self-pay | Admitting: Family Medicine

## 2016-07-03 VITALS — BP 134/84 | HR 66 | Temp 98.3°F | Wt 252.2 lb

## 2016-07-03 DIAGNOSIS — E785 Hyperlipidemia, unspecified: Secondary | ICD-10-CM

## 2016-07-03 DIAGNOSIS — M25562 Pain in left knee: Secondary | ICD-10-CM

## 2016-07-03 DIAGNOSIS — M25512 Pain in left shoulder: Secondary | ICD-10-CM

## 2016-07-03 MED ORDER — ROSUVASTATIN CALCIUM 5 MG PO TABS: 5.0000 mg | ORAL_TABLET | ORAL | 6 refills | Status: DC

## 2016-07-03 NOTE — Assessment & Plan Note (Addendum)
Today exam more consistent with RTC tendonitis, impingement, and ongoing bursitis. Will increase celebrex to 100mg  BID and refer to ortho for further evaluation/management.

## 2016-07-03 NOTE — Assessment & Plan Note (Signed)
Ongoing pain at patellar tendon, but now with worsening diffuse pain throughout knee. Will increase celebrex to 100mg  BID and refer to ortho for further evaluation.
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
## 2016-07-03 NOTE — Assessment & Plan Note (Signed)
LDL goal <100. Pt worried pravastatin causing body aches, congestion and abd pain. Will trial crestor - start 5mg  QOD dosing.

## 2016-07-03 NOTE — Patient Instructions (Addendum)
Try crestor (rosuvastatin) in place of pravastatin. Take 5mg  every other day and update me with effect.  For worsening left knee and shoulder pain - we will refer you to [facility] again. Continue current medicines.  Increase celebrex to 100mg  twice daily.

## 2016-07-13 ENCOUNTER — Other Ambulatory Visit: Payer: Self-pay

## 2016-07-13 MED ORDER — HYDROCODONE-ACETAMINOPHEN 10-325 MG PO TABS: 1.0000 | ORAL_TABLET | Freq: Three times a day (TID) | ORAL | 0 refills | Status: DC | PRN

## 2016-07-13 NOTE — Telephone Encounter (Signed)
Pt left v/m requesting rx hydrocodone apap. Call when ready for pick up. rx last printed # 40 on 06/14/16; last seen 07/03/16.

## 2016-07-13 NOTE — Telephone Encounter (Signed)
Printed and in CMA box 

## 2016-07-16 NOTE — Telephone Encounter (Signed)
Pt advised rx ready for pick up. Rx placed up front.

## 2016-07-19 ENCOUNTER — Ambulatory Visit (INDEPENDENT_AMBULATORY_CARE_PROVIDER_SITE_OTHER): Payer: Medicare HMO | Admitting: Orthopaedic Surgery

## 2016-07-19 ENCOUNTER — Ambulatory Visit (INDEPENDENT_AMBULATORY_CARE_PROVIDER_SITE_OTHER): Payer: Medicare HMO

## 2016-07-19 ENCOUNTER — Encounter (INDEPENDENT_AMBULATORY_CARE_PROVIDER_SITE_OTHER): Payer: Self-pay | Admitting: Orthopaedic Surgery

## 2016-07-19 DIAGNOSIS — M25512 Pain in left shoulder: Secondary | ICD-10-CM

## 2016-07-19 DIAGNOSIS — M25562 Pain in left knee: Secondary | ICD-10-CM | POA: Diagnosis not present

## 2016-07-19 MED ORDER — PREDNISONE 10 MG (21) PO TBPK: ORAL_TABLET | ORAL | 0 refills | Status: DC
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
## 2016-07-23 DIAGNOSIS — H524 Presbyopia: Secondary | ICD-10-CM | POA: Diagnosis not present

## 2016-08-09 ENCOUNTER — Other Ambulatory Visit: Payer: Self-pay | Admitting: Family Medicine

## 2016-08-09 NOTE — Telephone Encounter (Signed)
All meds are due for a refill, pt had CPE on 06/08/16, please advise

## 2016-08-15 ENCOUNTER — Other Ambulatory Visit: Payer: Self-pay

## 2016-08-15 MED ORDER — HYDROCODONE-ACETAMINOPHEN 10-325 MG PO TABS: 1.0000 | ORAL_TABLET | Freq: Three times a day (TID) | ORAL | 0 refills | Status: DC | PRN

## 2016-08-15 NOTE — Telephone Encounter (Signed)
Rx printed and in CMA box.  

## 2016-08-15 NOTE — Telephone Encounter (Signed)
Spoke to pt and informed him Rx is available for pickup from the front desk 

## 2016-08-15 NOTE — Telephone Encounter (Signed)
Pt left v/m requesting rx hydrocodone apap. Call when ready for pick up. rx last printed # 40 on 07/13/16 and pt last seen 07/03/16.

## 2016-09-17 ENCOUNTER — Other Ambulatory Visit: Payer: Self-pay

## 2016-09-17 MED ORDER — HYDROCODONE-ACETAMINOPHEN 10-325 MG PO TABS: 1.0000 | ORAL_TABLET | Freq: Three times a day (TID) | ORAL | 0 refills | Status: DC | PRN

## 2016-09-17 NOTE — Telephone Encounter (Signed)
Spoke to pt and informed him Rx is available for pickup from the front desk 

## 2016-09-17 NOTE — Telephone Encounter (Signed)
Last written 08-15-16  Last OV 07-03-16 No Future OV Last CSA and UDS 06-08-16

## 2016-09-17 NOTE — Telephone Encounter (Signed)
Printed and in CMA box 

## 2016-10-18 ENCOUNTER — Other Ambulatory Visit: Payer: Self-pay

## 2016-10-18 NOTE — Telephone Encounter (Signed)
Pt left v/m requesting rx hydrocodone apap. Call when ready for pick up. Last printed # 40 on 09/17/16. Last seen 07/03/16.

## 2016-10-19 MED ORDER — HYDROCODONE-ACETAMINOPHEN 10-325 MG PO TABS: 1.0000 | ORAL_TABLET | Freq: Three times a day (TID) | ORAL | 0 refills | Status: DC | PRN

## 2016-10-19 NOTE — Telephone Encounter (Signed)
Spoke with pt, notified him rx is ready to pick up.

## 2016-10-19 NOTE — Telephone Encounter (Signed)
Printed and in CMA box 

## 2016-11-20 ENCOUNTER — Other Ambulatory Visit: Payer: Self-pay

## 2016-11-20 DIAGNOSIS — G8929 Other chronic pain: Secondary | ICD-10-CM

## 2016-11-20 MED ORDER — HYDROCODONE-ACETAMINOPHEN 10-325 MG PO TABS: 1.0000 | ORAL_TABLET | Freq: Three times a day (TID) | ORAL | 0 refills | Status: DC | PRN

## 2016-11-20 NOTE — Telephone Encounter (Signed)
Left message on vm per dpr notifying pt rx is ready to pick up. [Placed rx at front office.]  

## 2016-11-20 NOTE — Telephone Encounter (Signed)
Printed and in CMA box 

## 2016-11-20 NOTE — Telephone Encounter (Signed)
Pt left v/m requesting rx hydrocodone apap. Call when ready for pick up. rx last printed # 40 on 10/19/16.last seen 07/03/16.

## 2016-11-29 ENCOUNTER — Other Ambulatory Visit: Payer: Self-pay | Admitting: Family Medicine

## 2016-11-29 DIAGNOSIS — E785 Hyperlipidemia, unspecified: Secondary | ICD-10-CM

## 2016-11-29 DIAGNOSIS — M109 Gout, unspecified: Secondary | ICD-10-CM

## 2016-11-30 ENCOUNTER — Other Ambulatory Visit: Payer: Self-pay

## 2016-11-30 ENCOUNTER — Ambulatory Visit (INDEPENDENT_AMBULATORY_CARE_PROVIDER_SITE_OTHER): Payer: Medicare HMO

## 2016-11-30 VITALS — BP 114/82 | HR 61 | Temp 98.8°F | Ht 74.5 in | Wt 255.2 lb

## 2016-11-30 DIAGNOSIS — Z0000 Encounter for general adult medical examination without abnormal findings: Secondary | ICD-10-CM

## 2016-11-30 DIAGNOSIS — Z23 Encounter for immunization: Secondary | ICD-10-CM

## 2016-11-30 DIAGNOSIS — M109 Gout, unspecified: Secondary | ICD-10-CM | POA: Diagnosis not present

## 2016-11-30 DIAGNOSIS — E785 Hyperlipidemia, unspecified: Secondary | ICD-10-CM | POA: Diagnosis not present

## 2016-11-30 LAB — BASIC METABOLIC PANEL
BUN: 12 mg/dL (ref 6–23)
CALCIUM: 9.6 mg/dL (ref 8.4–10.5)
CHLORIDE: 103 meq/L (ref 96–112)
CO2: 30 mEq/L (ref 19–32)
CREATININE: 1.1 mg/dL (ref 0.40–1.50)
GFR: 88.15 mL/min (ref >60.00)
Glucose, Bld: 96 mg/dL (ref 70–99)
Potassium: 3.7 mEq/L (ref 3.5–5.1)
SODIUM: 138 meq/L (ref 135–145)

## 2016-11-30 LAB — LIPID PANEL
CHOL/HDL RATIO: 3
Cholesterol: 179 mg/dL (ref 0–200)
HDL: 55 mg/dL (ref >39.00)
LDL Cholesterol: 115 mg/dL — ABNORMAL HIGH (ref 0–99)
NONHDL: 123.99
Triglycerides: 44 mg/dL (ref 0.0–149.0)
VLDL: 8.8 mg/dL (ref 0.0–40.0)

## 2016-11-30 LAB — URIC ACID: URIC ACID, SERUM: 7.6 mg/dL (ref 4.0–7.8)

## 2016-11-30 MED ORDER — COLCHICINE 0.6 MG PO TABS: 0.6000 mg | ORAL_TABLET | Freq: Every day | ORAL | 1 refills | Status: DC | PRN
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
## 2016-11-30 NOTE — Telephone Encounter (Signed)
Patient in today for AWV. Pt requested a refill of [place]. PCP verbally approved. Refill request sent electronically to [facility] per pt request.

## 2016-11-30 NOTE — Progress Notes (Signed)
Pre visit review using our clinic review tool, if applicable. No additional management support is needed unless otherwise documented below in the visit note. 

## 2016-11-30 NOTE — Patient Instructions (Signed)
Rodney Charles , Thank you for taking time to come for your Medicare Wellness Visit. I appreciate your ongoing commitment to your health goals. Please review the following plan we discussed and let me know if I can assist you in the future.   These are the goals we discussed: Goals    . Increase physical activity          Starting 11/30/2016 and as weather permits, I will continue to walk at least 30 min 5 days per week.        This is a list of the screening recommended for you and due dates:  Health Maintenance  Topic Date Due  . HIV Screening  11/30/2048*  . Colon Cancer Screening  04/07/2019  . DTaP/Tdap/Td vaccine (2 - Td) 09/10/2022  . Tetanus Vaccine  09/10/2022  . Flu Shot  Completed  .  Hepatitis C: One time screening is recommended by Center for Disease Control  (CDC) for  adults born from [birth year] through 1965.   Completed  *Topic was postponed. The date shown is not the original due date.   Preventive Care for Adults  A healthy lifestyle and preventive care can promote health and wellness. Preventive health guidelines for adults include the following key practices.  . A routine yearly physical is a good way to check with your health care provider about your health and preventive screening. It is a chance to share any concerns and updates on your health and to receive a thorough exam.  . Visit your dentist for a routine exam and preventive care every 6 months. Brush your teeth twice a day and floss once a day. Good oral hygiene prevents tooth decay and gum disease.  . The frequency of eye exams is based on your age, health, family medical history, use  of contact lenses, and other factors. Follow your health care provider's recommendations for frequency of eye exams.  . Eat a healthy diet. Foods like vegetables, fruits, whole grains, low-fat dairy products, and lean protein foods contain the nutrients you need without too many calories. Decrease your intake of foods high in  solid fats, added sugars, and salt. Eat the right amount of calories for you. Get information about a proper diet from your health care provider, if necessary.  . Regular physical exercise is one of the most important things you can do for your health. Most adults should get at least 150 minutes of moderate-intensity exercise (any activity that increases your heart rate and causes you to sweat) each week. In addition, most adults need muscle-strengthening exercises on 2 or more days a week.  Silver Sneakers may be a benefit available to you. To determine eligibility, you may visit the website: www.silversneakers.com or contact program at 9035627826 Mon-Fri between 8AM-8PM.   . Maintain a healthy weight. The body mass index (BMI) is a screening tool to identify possible weight problems. It provides an estimate of body fat based on height and weight. Your health care provider can find your BMI and can help you achieve or maintain a healthy weight.   For adults 20 years and older: ? A BMI below 18.5 is considered underweight. ? A BMI of 18.5 to 24.9 is normal. ? A BMI of 25 to 29.9 is considered overweight. ? A BMI of 30 and above is considered obese.   . Maintain normal blood lipids and cholesterol levels by exercising and minimizing your intake of saturated fat. Eat a balanced diet with plenty of fruit and vegetables.  Blood tests for lipids and cholesterol should begin at age 87 and be repeated every 5 years. If your lipid or cholesterol levels are high, you are over 50, or you are at high risk for heart disease, you may need your cholesterol levels checked more frequently. Ongoing high lipid and cholesterol levels should be treated with medicines if diet and exercise are not working.  . If you smoke, find out from your health care provider how to quit. If you do not use tobacco, please do not start.  . If you choose to drink alcohol, please do not consume more than 2 drinks per day. One drink  is considered to be 12 ounces (355 mL) of beer, 5 ounces (148 mL) of wine, or 1.5 ounces (44 mL) of liquor.  . If you are 23-30 years old, ask your health care provider if you should take aspirin to prevent strokes.  . Use sunscreen. Apply sunscreen liberally and repeatedly throughout the day. You should seek shade when your shadow is shorter than you. Protect yourself by wearing long sleeves, pants, a wide-brimmed hat, and sunglasses year round, whenever you are outdoors.  . Once a month, do a whole body skin exam, using a mirror to look at the skin on your back. Tell your health care provider of new moles, moles that have irregular borders, moles that are larger than a pencil eraser, or moles that have changed in shape or color.

## 2016-11-30 NOTE — Progress Notes (Signed)
PCP notes:   Health maintenance:  Flu vaccine - administered HIV screening - pt declined  Abnormal screenings:   Depression score: 14 Mini-Cog score: 19/20  Patient concerns:   Pt requested refill of Colchicine. PCP notified. Verbal approval given from PCP to reorder medication. Refill request sent to pharmacy.  Nurse concerns:  None  Next PCP appt:   12/04/16 @ 1030

## 2016-12-03 NOTE — Progress Notes (Signed)
I reviewed health advisor's note, was available for consultation, and agree with documentation and plan.  

## 2016-12-04 ENCOUNTER — Encounter: Payer: Self-pay | Admitting: Family Medicine

## 2016-12-04 ENCOUNTER — Ambulatory Visit (INDEPENDENT_AMBULATORY_CARE_PROVIDER_SITE_OTHER): Payer: Medicare HMO | Admitting: Family Medicine

## 2016-12-04 VITALS — BP 120/76 | HR 55 | Temp 98.1°F | Ht 75.0 in | Wt 255.0 lb

## 2016-12-04 DIAGNOSIS — I700 Atherosclerosis of aorta: Secondary | ICD-10-CM | POA: Diagnosis not present

## 2016-12-04 DIAGNOSIS — F39 Unspecified mood [affective] disorder: Secondary | ICD-10-CM | POA: Diagnosis not present

## 2016-12-04 DIAGNOSIS — G8929 Other chronic pain: Secondary | ICD-10-CM

## 2016-12-04 DIAGNOSIS — M109 Gout, unspecified: Secondary | ICD-10-CM

## 2016-12-04 DIAGNOSIS — Z0000 Encounter for general adult medical examination without abnormal findings: Secondary | ICD-10-CM

## 2016-12-04 DIAGNOSIS — M545 Low back pain, unspecified: Secondary | ICD-10-CM

## 2016-12-04 DIAGNOSIS — E669 Obesity, unspecified: Secondary | ICD-10-CM | POA: Diagnosis not present

## 2016-12-04 DIAGNOSIS — M25512 Pain in left shoulder: Secondary | ICD-10-CM | POA: Diagnosis not present

## 2016-12-04 DIAGNOSIS — E785 Hyperlipidemia, unspecified: Secondary | ICD-10-CM

## 2016-12-04 DIAGNOSIS — K219 Gastro-esophageal reflux disease without esophagitis: Secondary | ICD-10-CM | POA: Diagnosis not present

## 2016-12-04 NOTE — Assessment & Plan Note (Signed)
Chronic, stable and tolerating crestor. Continue (prior on pravastatin)

## 2016-12-04 NOTE — Assessment & Plan Note (Signed)
Recurrent flare - suggested he call ortho for f/u (seen 06/2016 for same issue)

## 2016-12-04 NOTE — Assessment & Plan Note (Signed)
Discussed with patient. Will have him return for Q7mo pain management visit.

## 2016-12-04 NOTE — Assessment & Plan Note (Signed)
Preventative protocols reviewed and updated unless pt declined. Discussed healthy diet and lifestyle.  

## 2016-12-04 NOTE — Assessment & Plan Note (Signed)
Activity limited by chronic pain, but he stays as active as able.

## 2016-12-04 NOTE — Patient Instructions (Addendum)
Try to back down on colchicine to as needed for gout flare.  You are doing well today. Return as needed or in 3 months for pain management follow up visit.   Health Maintenance, Male A healthy lifestyle and preventive care is important for your health and wellness. Ask your health care provider about what schedule of regular examinations is right for you. What should I know about weight and diet? Eat a Healthy Diet  Eat plenty of vegetables, fruits, whole grains, low-fat dairy products, and lean protein.  Do not eat a lot of foods high in solid fats, added sugars, or salt.  Maintain a Healthy Weight Regular exercise can help you achieve or maintain a healthy weight. You should:  Do at least 150 minutes of exercise each week. The exercise should increase your heart rate and make you sweat (moderate-intensity exercise).  Do strength-training exercises at least twice a week.  Watch Your Levels of Cholesterol and Blood Lipids  Have your blood tested for lipids and cholesterol every 5 years starting at 59 years of age. If you are at high risk for heart disease, you should start having your blood tested when you are 59 years old. You may need to have your cholesterol levels checked more often if: ? Your lipid or cholesterol levels are high. ? You are older than 59 years of age. ? You are at high risk for heart disease.  What should I know about cancer screening? Many types of cancers can be detected early and may often be prevented. Lung Cancer  You should be screened every year for lung cancer if: ? You are a current smoker who has smoked for at least 30 years. ? You are a former smoker who has quit within the past 15 years.  Talk to your health care provider about your screening options, when you should start screening, and how often you should be screened.  Colorectal Cancer  Routine colorectal cancer screening usually begins at 59 years of age and should be repeated every 5-10  years until you are 59 years old. You may need to be screened more often if early forms of precancerous polyps or small growths are found. Your health care provider may recommend screening at an earlier age if you have risk factors for colon cancer.  Your health care provider may recommend using home test kits to check for hidden blood in the stool.  A small camera at the end of a tube can be used to examine your colon (sigmoidoscopy or colonoscopy). This checks for the earliest forms of colorectal cancer.  Prostate and Testicular Cancer  Depending on your age and overall health, your health care provider may do certain tests to screen for prostate and testicular cancer.  Talk to your health care provider about any symptoms or concerns you have about testicular or prostate cancer.  Skin Cancer  Check your skin from head to toe regularly.  Tell your health care provider about any new moles or changes in moles, especially if: ? There is a change in a mole's size, shape, or color. ? You have a mole that is larger than a pencil eraser.  Always use sunscreen. Apply sunscreen liberally and repeat throughout the day.  Protect yourself by wearing long sleeves, pants, a wide-brimmed hat, and sunglasses when outside.  What should I know about heart disease, diabetes, and high blood pressure?  If you are 57-29 years of age, have your blood pressure checked every 3-5 years. If you  are 70 years of age or older, have your blood pressure checked every year. You should have your blood pressure measured twice-once when you are at a hospital or clinic, and once when you are not at a hospital or clinic. Record the average of the two measurements. To check your blood pressure when you are not at a hospital or clinic, you can use: ? An automated blood pressure machine at a pharmacy. ? A home blood pressure monitor.  Talk to your health care provider about your target blood pressure.  If you are between  110-76 years old, ask your health care provider if you should take aspirin to prevent heart disease.  Have regular diabetes screenings by checking your fasting blood sugar level. ? If you are at a normal weight and have a low risk for diabetes, have this test once every three years after the age of 23. ? If you are overweight and have a high risk for diabetes, consider being tested at a younger age or more often.  A one-time screening for abdominal aortic aneurysm (AAA) by ultrasound is recommended for men aged 7-75 years who are current or former smokers. What should I know about preventing infection? Hepatitis B If you have a higher risk for hepatitis B, you should be screened for this virus. Talk with your health care provider to find out if you are at risk for hepatitis B infection. Hepatitis C Blood testing is recommended for:  Everyone born from [birth year] through 1963.  Anyone with known risk factors for hepatitis C.  Sexually Transmitted Diseases (STDs)  You should be screened each year for STDs including gonorrhea and chlamydia if: ? You are sexually active and are younger than 59 years of age. ? You are older than 59 years of age and your health care provider tells you that you are at risk for this type of infection. ? Your sexual activity has changed since you were last screened and you are at an increased risk for chlamydia or gonorrhea. Ask your health care provider if you are at risk.  Talk with your health care provider about whether you are at high risk of being infected with HIV. Your health care provider may recommend a prescription medicine to help prevent HIV infection.  What else can I do?  Schedule regular health, dental, and eye exams.  Stay current with your vaccines (immunizations).  Do not use any tobacco products, such as cigarettes, chewing tobacco, and e-cigarettes. If you need help quitting, ask your health care provider.  Limit alcohol intake to no more than  2 drinks per day. One drink equals 12 ounces of beer, 5 ounces of wine, or 1 ounces of hard liquor.  Do not use street drugs.  Do not share needles.  Ask your health care provider for help if you need support or information about quitting drugs.  Tell your health care provider if you often feel depressed.  Tell your health care provider if you have ever been abused or do not feel safe at home. This information is not intended to replace advice given to you by your health care provider. Make sure you discuss any questions you have with your health care provider. Document Released: 07/21/2007 Document Revised: 09/21/2015 Document Reviewed: 10/26/2014 Elsevier Interactive Patient Education  [employer].

## 2016-12-04 NOTE — Assessment & Plan Note (Addendum)
Continue statin. Consider aspirin

## 2016-12-04 NOTE — Assessment & Plan Note (Signed)
No recent flares. He tries to take colchicine 0.6mg  QOD. Suggested change to PRN and update if recurrent flares.

## 2016-12-04 NOTE — Assessment & Plan Note (Signed)
Continue current pain regimen. Taking medications appropriately.

## 2016-12-04 NOTE — Assessment & Plan Note (Signed)
Continue current regimen - stable but continues to struggle with limitations in activity due to chronic pain.

## 2016-12-04 NOTE — Progress Notes (Signed)
BP 120/76 (BP Location: Right Arm, Patient Position: Sitting, Cuff Size: Large)   Pulse (!) 55   Temp 98.1 F (36.7 C) (Oral)   Ht 6\' 3"  (1.905 m)   Wt 255 lb (115.7 kg)   SpO2 95%   BMI 31.87 kg/m    CC: CPE Subjective:    Patient ID: Rodney Charles, male    DOB: 1955-07-31, 61 y.o.   MRN: [id number]  HPI: Rodney Charles is a 61 y.o. male presenting on 12/04/2016 for Annual Exam (Pt 2)   Saw Katha Cabal last week for medicare wellness visit. Note reviewed.    No recent gout flares.   Chronic lower back pain - uses TENS unit as well as biofreeze, celebrex, tylenol, hydrocodone, gabapentin, amitriptyline. Uses hydrocodone sparingly.  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 04/2014 WNL, rpt 5 yrs [facility]ene Mares) Prostate cancer screening - discussed, would like screening. No fmhx prostates cancer. Nocturia 2x/night. Strong stream. Lung cancer screening - undergoing Flu shot yearly Tdap 09/2012 Advanced directives: does not have set up. Thinks sister Levada Dy would be HCPOA. Packet provided today  Seat belt use discussed Sunscreen use discussed. No changing moles on skin Ex smoker quit 2005 Alcohol - none  Lives alone, dog  Occupation: disability for lumbar and neck pain (~2007) Handicap placard - unable to walk >235ft w/o stopping to rest Edu: HS  Activity: some walking, limited by pain Diet: good water, fruits/vegetables daily  Relevant past medical, surgical, family and social history reviewed and updated as indicated. Interim medical history since our last visit reviewed. Allergies and medications reviewed and updated. Outpatient Medications Prior to Visit  Medication Sig Dispense Refill  . acetaminophen (TYLENOL) 500 MG tablet Take 500 mg by mouth every 6 (six) hours as needed.    Marland Kitchen amitriptyline (ELAVIL) 25 MG tablet TAKE 1/2 TO 1 TABLET BY MOUTH EVERY NIGHT AT BEDTIME (Patient taking differently: TAKE 1 TABLET BY MOUTH EVERY NIGHT AT BEDTIME) 90  tablet 3  . celecoxib (CELEBREX) 100 MG capsule Take 1 capsule (100 mg total) by mouth 2 (two) times daily.    . colchicine 0.6 MG tablet Take 1 tablet (0.6 mg total) by mouth daily as needed (joint pain). 30 tablet 1  . esomeprazole (NEXIUM) 40 MG capsule Take 40 mg by mouth daily before breakfast.    . fluticasone (FLONASE) 50 MCG/ACT nasal spray Place 2 sprays into both nostrils daily. 16 g 11  . gabapentin (NEURONTIN) 300 MG capsule TAKE 1 CAPSULE(300 MG) BY MOUTH THREE TIMES DAILY 270 capsule 3  . HYDROcodone-acetaminophen (NORCO) 10-325 MG tablet Take 1 tablet by mouth every 8 (eight) hours as needed. 40 tablet 0  . Menthol, Topical Analgesic, (BIOFREEZE EX) Apply topically as directed.    . Multiple Vitamin (MULTIVITAMIN) tablet Take 1 tablet by mouth daily.    . rosuvastatin (CRESTOR) 5 MG tablet Take 1 tablet (5 mg total) by mouth every other day. 30 tablet 6  . sertraline (ZOLOFT) 25 MG tablet TAKE 1 TABLET(25 MG) BY MOUTH DAILY 90 tablet 3   No facility-administered medications prior to visit.      Per HPI unless specifically indicated in ROS section below Review of Systems  Constitutional: Negative for activity change, appetite change, chills, fatigue, fever and unexpected weight change.  HENT: Negative for hearing loss.   Eyes: Negative for visual disturbance.  Respiratory: Negative for cough, chest tightness, shortness of breath and wheezing.   Cardiovascular: Negative for chest pain, palpitations and leg swelling.  Gastrointestinal: Negative for abdominal distention, abdominal pain, blood in stool, constipation, diarrhea, nausea and vomiting.  Genitourinary: Negative for difficulty urinating and hematuria.  Musculoskeletal: Negative for arthralgias, myalgias and neck pain.  Skin: Negative for rash.  Neurological: Negative for dizziness, seizures, syncope and headaches.  Hematological: Negative for adenopathy. Does not bruise/bleed easily.  Psychiatric/Behavioral: Negative  for dysphoric mood. The patient is not nervous/anxious.        Objective:    BP 120/76 (BP Location: Right Arm, Patient Position: Sitting, Cuff Size: Large)   Pulse (!) 55   Temp 98.1 F (36.7 C) (Oral)   Ht 6\' 3"  (1.905 m)   Wt 255 lb (115.7 kg)   SpO2 95%   BMI 31.87 kg/m   Wt Readings from Last 3 Encounters:  12/04/16 255 lb (115.7 kg)  11/30/16 255 lb 4 oz (115.8 kg)  07/03/16 252 lb 4 oz (114.4 kg)    Physical Exam  Constitutional: He is oriented to person, place, and time. He appears well-developed and well-nourished. No distress.  HENT:  Head: Normocephalic and atraumatic.  Right Ear: Hearing, tympanic membrane, external ear and ear canal normal.  Left Ear: Hearing, tympanic membrane, external ear and ear canal normal.  Nose: Nose normal.  Mouth/Throat: Uvula is midline, oropharynx is clear and moist and mucous membranes are normal. No oropharyngeal exudate, posterior oropharyngeal edema or posterior oropharyngeal erythema.  Eyes: Pupils are equal, round, and reactive to light. Conjunctivae and EOM are normal. No scleral icterus.  Neck: Normal range of motion. Neck supple. No thyromegaly present.  Cardiovascular: Normal rate, regular rhythm, normal heart sounds and intact distal pulses.   No murmur heard. Pulses:      Radial pulses are 2+ on the right side, and 2+ on the left side.  Pulmonary/Chest: Effort normal and breath sounds normal. No respiratory distress. He has no wheezes. He has no rales.  Abdominal: Soft. Bowel sounds are normal. He exhibits no distension and no mass. There is no tenderness. There is no rebound and no guarding.  Musculoskeletal: Normal range of motion. He exhibits no edema.  Lymphadenopathy:    He has no cervical adenopathy.  Neurological: He is alert and oriented to person, place, and time.  CN grossly intact, station and gait intact  Skin: Skin is warm and dry. No rash noted.  Psychiatric: He has a normal mood and affect. His behavior is  normal. Judgment and thought content normal.  Nursing note and vitals reviewed.  Results for orders placed or performed in visit on 11/30/16  Lipid panel  Result Value Ref Range   Cholesterol 179 0 - 200 mg/dL   Triglycerides 44.0 0.0 - 149.0 mg/dL   HDL 55.00 >39.00 mg/dL   VLDL 8.8 0.0 - 40.0 mg/dL   LDL Cholesterol 115 (H) 0 - 99 mg/dL   Total CHOL/HDL Ratio 3    NonHDL 366.44   Basic metabolic panel  Result Value Ref Range   Sodium 138 135 - 145 mEq/L   Potassium 3.7 3.5 - 5.1 mEq/L   Chloride 103 96 - 112 mEq/L   CO2 30 19 - 32 mEq/L   Glucose, Bld 96 70 - 99 mg/dL   BUN 12 6 - 23 mg/dL   Creatinine, Ser 1.10 0.40 - 1.50 mg/dL   Calcium 9.6 8.4 - 10.5 mg/dL   GFR 88.15 >60.00 mL/min  Uric acid  Result Value Ref Range   Uric Acid, Serum 7.6 4.0 - 7.8 mg/dL   Lab Results  Component  Value Date   PSA 0.82 06/06/2016   PSA 0.58 03/14/2015   PSA 0.56 03/25/2014       Assessment & Plan:   Problem List Items Addressed This Visit    Aortic atherosclerosis ([place])    Continue statin. Consider aspirin      Chronic lower back pain    Continue current pain regimen. Taking medications appropriately.       Encounter for chronic pain management    Discussed with patient. Will have him return for Q71mo pain management visit.       GERD (gastroesophageal reflux disease)    Chronic, stable. Continue nexium 40mg  daily.       Gout    No recent flares. He tries to take colchicine 0.6mg  QOD. Suggested change to PRN and update if recurrent flares.       Health maintenance examination - Primary    Preventative protocols reviewed and updated unless pt declined. Discussed healthy diet and lifestyle.       HLD (hyperlipidemia)    Chronic, stable and tolerating crestor. Continue (prior on pravastatin)      Left shoulder pain    Recurrent flare - suggested he call ortho for f/u (seen 06/2016 for same issue)      Mood disorder ([place])    Continue current regimen - stable but  continues to struggle with limitations in activity due to chronic pain.       Obesity, Class I, BMI 30-34.9    Activity limited by chronic pain, but he stays as active as able.           Follow up plan: Return in about 3 months (around 03/06/2017) for follow up visit.  Ria Bush, MD

## 2016-12-04 NOTE — Assessment & Plan Note (Signed)
Chronic, stable. Continue nexium 40mg  daily.

## 2016-12-21 ENCOUNTER — Other Ambulatory Visit: Payer: Self-pay | Admitting: Family Medicine

## 2016-12-21 NOTE — Telephone Encounter (Signed)
Last printed:  11/20/16, #40 Last OV (CPE):  12/04/16 Next OV: 03/07/17

## 2016-12-21 NOTE — Telephone Encounter (Signed)
Rx request 

## 2016-12-21 NOTE — Telephone Encounter (Signed)
Copied from [place] [phone] [phone]. Topic: Inquiry >> Dec 21, 2016 10:18 AM Pricilla Handler wrote: Reason for CRM: Patient called requesting a refill of HYDROcodone-acetaminophen (NORCO) 10-325 MG tablet. Patient's preferred pharmacy is [facility] [phone] - [place], [address] AT [phone] (Phone)  [phone] (Fax)

## 2016-12-21 NOTE — Addendum Note (Signed)
Addended by: Brenton Grills on: 25/63/8937 05:11 PM   Modules accepted: Orders

## 2016-12-24 MED ORDER — HYDROCODONE-ACETAMINOPHEN 10-325 MG PO TABS: 1.0000 | ORAL_TABLET | Freq: Three times a day (TID) | ORAL | 0 refills | Status: DC | PRN

## 2016-12-24 NOTE — Telephone Encounter (Signed)
Spoke with pt notifying him rx is ready to pick up. [Placed rx at front office.]

## 2016-12-24 NOTE — Telephone Encounter (Signed)
Printed and in Lisa's box.  

## 2017-01-06 ENCOUNTER — Other Ambulatory Visit: Payer: Self-pay | Admitting: Family Medicine

## 2017-01-07 NOTE — Telephone Encounter (Signed)
Last filled:  10/11/15, #90 Last OV (CPE):  12/04/17 Next OV:  03/07/17

## 2017-01-21 ENCOUNTER — Other Ambulatory Visit: Payer: Self-pay | Admitting: Family Medicine

## 2017-01-21 DIAGNOSIS — G8929 Other chronic pain: Secondary | ICD-10-CM

## 2017-01-21 NOTE — Telephone Encounter (Signed)
Copied from [place]. Topic: Quick Communication - See Telephone Encounter >> Jan 21, 2017  9:24 AM Cleaster Corin, NT wrote: CRM for notification. See Telephone encounter for:   01/21/17.Patient called requesting a refill of HYDROcodone-acetaminophen (NORCO) 10-325 MG tablet. Pt can be reached at [place], [address] [place] [address] Phone: [phone] Fax: [phone] Not a 24 hour pharmacy; exact hours not known

## 2017-01-21 NOTE — Telephone Encounter (Signed)
Last printed:  12/24/16, #40 Last OV (CPE):  12/04/16 Next OV:  03/06/17

## 2017-01-22 MED ORDER — HYDROCODONE-ACETAMINOPHEN 10-325 MG PO TABS: 1.0000 | ORAL_TABLET | Freq: Three times a day (TID) | ORAL | 0 refills | Status: DC | PRN

## 2017-01-22 NOTE — Telephone Encounter (Signed)
plz notify this was sent electronically 

## 2017-01-22 NOTE — Telephone Encounter (Signed)
Spoke with pt relaying message per Dr. Juluis Pitch his thanks.

## 2017-02-12 ENCOUNTER — Ambulatory Visit (INDEPENDENT_AMBULATORY_CARE_PROVIDER_SITE_OTHER): Payer: Medicare HMO | Admitting: Family Medicine

## 2017-02-12 ENCOUNTER — Encounter: Payer: Self-pay | Admitting: Family Medicine

## 2017-02-12 VITALS — BP 140/78 | HR 69 | Temp 98.4°F | Wt 257.5 lb

## 2017-02-12 DIAGNOSIS — Z0279 Encounter for issue of other medical certificate: Secondary | ICD-10-CM | POA: Insufficient documentation
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
## 2017-02-12 NOTE — Assessment & Plan Note (Signed)
I have no records of total disability. I asked him to bring me copy or proof of this and I will then write letter otherwise to go to provider who declared him disabled for them to fill out form. Pt agrees. No charge visit today.

## 2017-02-12 NOTE — Patient Instructions (Signed)
Find disability evaluation and then I can write letter or take this form to whoever declared you totally disabled for social security purposes and they can fill out for you.

## 2017-02-22 ENCOUNTER — Other Ambulatory Visit: Payer: Self-pay | Admitting: Family Medicine

## 2017-02-22 NOTE — Telephone Encounter (Signed)
Copied from [place]. Topic: Quick Communication - Rx Refill/Question >> Feb 22, 2017 12:30 PM Patrice Paradise wrote: Medication: HYDROcodone-acetaminophen (NORCO) 10-325 MG tablet    Has the patient contacted their pharmacy? Yes.     (Agent: If no, request that the patient contact the pharmacy for the refill.)   Preferred Pharmacy (with phone number or street name):  [facility] [place], [address] AT [address] [place] [address] Phone: [phone] Fax: [phone]     Agent: Please be advised that RX refills may take up to 3 business days. We ask that you follow-up with your pharmacy.

## 2017-02-22 NOTE — Telephone Encounter (Signed)
Controlled substance 

## 2017-02-22 NOTE — Addendum Note (Signed)
Addended by: Helene Shoe on: 02/22/2017 02:02 PM   Modules accepted: Orders

## 2017-02-22 NOTE — Telephone Encounter (Signed)
Pt  requesting rx hydrocodone apap. rx last refilled # 40 on 01/22/17. Last seen 02/12/17 and UDS done on 06/11/16. [facility] [address] [facility].

## 2017-02-23 MED ORDER — HYDROCODONE-ACETAMINOPHEN 10-325 MG PO TABS: 1.0000 | ORAL_TABLET | Freq: Three times a day (TID) | ORAL | 0 refills | Status: DC | PRN

## 2017-02-23 NOTE — Addendum Note (Signed)
Addended by: Ria Bush on: 02/23/2017 12:11 PM   Modules accepted: Orders

## 2017-02-23 NOTE — Telephone Encounter (Signed)
Sent electronically 

## 2017-03-07 ENCOUNTER — Ambulatory Visit (INDEPENDENT_AMBULATORY_CARE_PROVIDER_SITE_OTHER): Payer: Medicare HMO | Admitting: Family Medicine

## 2017-03-07 ENCOUNTER — Encounter: Payer: Self-pay | Admitting: Family Medicine

## 2017-03-07 VITALS — BP 136/82 | HR 67 | Temp 98.3°F | Wt 258.2 lb

## 2017-03-07 DIAGNOSIS — F39 Unspecified mood [affective] disorder: Secondary | ICD-10-CM

## 2017-03-07 DIAGNOSIS — G8929 Other chronic pain: Secondary | ICD-10-CM | POA: Diagnosis not present

## 2017-03-07 DIAGNOSIS — J439 Emphysema, unspecified: Secondary | ICD-10-CM | POA: Diagnosis not present

## 2017-03-07 DIAGNOSIS — M542 Cervicalgia: Secondary | ICD-10-CM

## 2017-03-07 DIAGNOSIS — M545 Low back pain: Secondary | ICD-10-CM | POA: Diagnosis not present

## 2017-03-07 DIAGNOSIS — E669 Obesity, unspecified: Secondary | ICD-10-CM

## 2017-03-07 DIAGNOSIS — Z0279 Encounter for issue of other medical certificate: Secondary | ICD-10-CM

## 2017-03-07 MED ORDER — METHOCARBAMOL 500 MG PO TABS: 500.0000 mg | ORAL_TABLET | Freq: Two times a day (BID) | ORAL | 3 refills | Status: DC | PRN

## 2017-03-07 NOTE — Assessment & Plan Note (Addendum)
[place] CSRS last reviewed 01/2017.  Tolerating medication, benefits continue to outweigh risks of narcotic therapy. Continue narcotic medication, NSAID, TCA. He was not taking muscle relaxant - given muscular spasm/tightness noted today, will start robaxin as well. Cautioned with mixing medications with increased sedation.
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
## 2017-03-07 NOTE — Patient Instructions (Addendum)
Good to see you today. Continue current medicines. Let us know when hydrocodone is due for refill.  Trial robaxin muscle relaxant - caution with sedation especially if taken with hydrocodone.

## 2017-03-09 NOTE — Assessment & Plan Note (Addendum)
S/p surgery. Continue narcotic, NSAID, muscle relaxant, TCA.

## 2017-03-09 NOTE — Assessment & Plan Note (Signed)
He brings records from prior disability evaluation which will be reviewed. He will drop off prior form he had requested filled out for me to review.

## 2017-03-09 NOTE — Assessment & Plan Note (Addendum)
Situational depression to physical limitations from chronic back pain  Continue zoloft daily, elavil at bedtime.

## 2017-03-09 NOTE — Addendum Note (Signed)
Addended by: Ria Bush on: 03/09/2017 11:01 AM   Modules accepted: Level of Service

## 2017-03-09 NOTE — Assessment & Plan Note (Signed)
S/p multiple surgeries. Continue narcotic, NSAID, muscle relaxant, TCA.

## 2017-03-09 NOTE — Assessment & Plan Note (Signed)
Activity limited by chronic back pain.

## 2017-03-09 NOTE — Assessment & Plan Note (Signed)
Stable off respiratory medication.  

## 2017-03-18 ENCOUNTER — Telehealth: Payer: Self-pay

## 2017-03-18 NOTE — Telephone Encounter (Signed)
Pt came in requesting a tax relief form to be filled out. Placed in Rx tower.

## 2017-03-28 ENCOUNTER — Other Ambulatory Visit: Payer: Self-pay | Admitting: Family Medicine

## 2017-03-28 NOTE — Telephone Encounter (Signed)
Hydrocodone refill Last OV: 03/07/17 Last Refill:02/23/17 # 40 [facility]acy:[facility] [address]

## 2017-03-28 NOTE — Telephone Encounter (Signed)
Copied from [place]. Topic: Quick Communication - Rx Refill/Question >> Mar 28, 2017  9:31 AM Carolyn Stare wrote:  Medication  HYDROcodone-acetaminophen (NORCO) 10-325 MG tablet  Preferred Pharmacy    [facility] [address]   Agent: Please be advised that RX refills may take up to 3 business days. We ask that you follow-up with your pharmacy.

## 2017-03-31 MED ORDER — HYDROCODONE-ACETAMINOPHEN 10-325 MG PO TABS: 1.0000 | ORAL_TABLET | Freq: Three times a day (TID) | ORAL | 0 refills | Status: DC | PRN

## 2017-03-31 NOTE — Telephone Encounter (Signed)
Eprescribed.

## 2017-04-05 NOTE — Telephone Encounter (Signed)
Spoke with pt relaying Dr. G's message. Pt verbalizes understanding and expresses thanks for the call. 

## 2017-04-05 NOTE — Telephone Encounter (Signed)
Still working on it.  Will be available on Monday.

## 2017-04-05 NOTE — Telephone Encounter (Signed)
Copied from [place] [phone]. Topic: Quick Communication - See Telephone Encounter >> Apr 05, 2017 10:21 AM Antonieta Iba C wrote: CRM for notification. See Telephone encounter for: pt called in to follow up on his paperwork. Pt would like a call back  CB: [phone]   04/05/17.

## 2017-04-05 NOTE — Telephone Encounter (Signed)
Dr. G, do you have this form? ?

## 2017-04-08 ENCOUNTER — Encounter: Payer: Self-pay | Admitting: Family Medicine

## 2017-04-08 NOTE — Telephone Encounter (Signed)
Spoke with pt notifying him the form is ready to pick up.  [Placed form at front office.] 

## 2017-04-08 NOTE — Telephone Encounter (Signed)
Letter written and in Lisa's box.  

## 2017-04-15 ENCOUNTER — Other Ambulatory Visit: Payer: Self-pay | Admitting: Family Medicine

## 2017-04-16 NOTE — Telephone Encounter (Signed)
Last filled:  01/07/17, #90 Last OV:  03/07/17 Next OV:  12/12/17

## 2017-04-22 ENCOUNTER — Ambulatory Visit (INDEPENDENT_AMBULATORY_CARE_PROVIDER_SITE_OTHER)
Admission: RE | Admit: 2017-04-22 | Discharge: 2017-04-22 | Disposition: A | Discharge status: Home / Self Care | Payer: Medicare HMO | Source: Ambulatory Visit | Attending: Acute Care | Admitting: Acute Care

## 2017-04-22 DIAGNOSIS — Z87891 Personal history of nicotine dependence: Secondary | ICD-10-CM

## 2017-04-25 ENCOUNTER — Other Ambulatory Visit: Payer: Self-pay | Admitting: Acute Care

## 2017-04-25 DIAGNOSIS — Z87891 Personal history of nicotine dependence: Secondary | ICD-10-CM

## 2017-04-25 DIAGNOSIS — Z122 Encounter for screening for malignant neoplasm of respiratory organs: Secondary | ICD-10-CM

## 2017-04-29 ENCOUNTER — Other Ambulatory Visit: Payer: Self-pay | Admitting: Family Medicine

## 2017-04-29 NOTE — Telephone Encounter (Signed)
Copied from [place] [id number]. Topic: Quick Communication - Rx Refill/Question >> Apr 29, 2017  3:53 PM Carolyn Stare wrote: Medication HYDROcodone-acetaminophen (NORCO) 10-325 MG tablet  Has the patient contacted their pharmacy yes      [facility]erred Pharmacy (with phone number or street name  [facility]   Agent: Please be advised that RX refills may take up to 3 business days. We ask that you follow-up with your pharmacy.

## 2017-04-30 NOTE — Telephone Encounter (Signed)
#  40 Next OV (CPE):  12/12/17

## 2017-04-30 NOTE — Telephone Encounter (Signed)
Hydrocodone-acetaminophen refill Last OV: 03/07/17 Last Refill:03/31/17 [facility]. PCP: Dr. Danise Mina

## 2017-05-03 MED ORDER — HYDROCODONE-ACETAMINOPHEN 10-325 MG PO TABS
1.0000 | ORAL_TABLET | Freq: Three times a day (TID) | ORAL | 0 refills | Status: DC | PRN
Start: 2017-05-03 — End: 2017-06-03

## 2017-05-03 NOTE — Telephone Encounter (Signed)
Eprescribed.

## 2017-06-03 ENCOUNTER — Other Ambulatory Visit: Payer: Self-pay | Admitting: Family Medicine

## 2017-06-03 DIAGNOSIS — G8929 Other chronic pain: Secondary | ICD-10-CM

## 2017-06-03 NOTE — Telephone Encounter (Signed)
Copied from [place] [id number]. Topic: Quick Communication - Rx Refill/Question >> Jun 03, 2017 12:09 PM Ahmed Prima L wrote: Medication: HYDROcodone-acetaminophen ([place]) 10-325 MG tablet Has the patient contacted their pharmacy?  (Agent: If no, request that the patient contact the pharmacy for the refill.) Preferred Pharmacy (with phone number or street name): [facility] [place], [place] AT [address]    Agent: Please be advised that RX refills may take up to 3 business days. We ask that you follow-up with your pharmacy.

## 2017-06-03 NOTE — Telephone Encounter (Signed)
LOV 03/07/17 Dr. Danise Mina Last refill 05/03/17 # 40 with 0 refills.

## 2017-06-04 MED ORDER — HYDROCODONE-ACETAMINOPHEN 10-325 MG PO TABS: 1.0000 | ORAL_TABLET | Freq: Three times a day (TID) | ORAL | 0 refills | Status: DC | PRN

## 2017-06-04 NOTE — Telephone Encounter (Signed)
[place] CSRS reviewed.  E prescribed. plz schedule chronic pain visit (q3 month visit).

## 2017-06-05 ENCOUNTER — Encounter: Payer: Self-pay | Admitting: Family Medicine

## 2017-06-05 ENCOUNTER — Ambulatory Visit (INDEPENDENT_AMBULATORY_CARE_PROVIDER_SITE_OTHER): Payer: Medicare HMO | Admitting: Family Medicine

## 2017-06-05 VITALS — BP 120/70 | HR 68 | Temp 97.8°F | Ht 74.5 in | Wt 265.0 lb

## 2017-06-05 DIAGNOSIS — M542 Cervicalgia: Secondary | ICD-10-CM | POA: Diagnosis not present

## 2017-06-05 DIAGNOSIS — M545 Low back pain, unspecified: Secondary | ICD-10-CM

## 2017-06-05 DIAGNOSIS — R0981 Nasal congestion: Secondary | ICD-10-CM | POA: Diagnosis not present

## 2017-06-05 DIAGNOSIS — R918 Other nonspecific abnormal finding of lung field: Secondary | ICD-10-CM

## 2017-06-05 DIAGNOSIS — I700 Atherosclerosis of aorta: Secondary | ICD-10-CM | POA: Diagnosis not present

## 2017-06-05 DIAGNOSIS — G8929 Other chronic pain: Secondary | ICD-10-CM | POA: Diagnosis not present

## 2017-06-05 MED ORDER — FLUTICASONE PROPIONATE 50 MCG/ACT NA SUSP: 2.0000 | Freq: Every day | NASAL | 11 refills | Status: DC

## 2017-06-05 MED ORDER — NORTRIPTYLINE HCL 10 MG PO CAPS: 10.0000 mg | ORAL_CAPSULE | Freq: Every day | ORAL | 1 refills | Status: DC

## 2017-06-05 NOTE — Assessment & Plan Note (Signed)
On disability for this. Trouble tolerating amitriptyline (oversedation). Will trial nortriptyline 10mg  at bedtime. Continue celebrex, tylenol, narcotic, robaxin.

## 2017-06-05 NOTE — Patient Instructions (Addendum)
Update UDS today.  Stop amitriptyline (overlly sedating).  Try nortriptyline 10mg  capsule at bedtime, if tolerated well and need higher dose, may take 2 capsules at bedtime. Update me with effect.  Return in 3 months for follow up visit

## 2017-06-05 NOTE — Assessment & Plan Note (Signed)
[place] CSRS reviewed when med filled last night.  Tolerating medication - continue. Update UDS and controlled substance agreement today.

## 2017-06-05 NOTE — Assessment & Plan Note (Signed)
Reviewed benign lung nodules on recent lung cancer screening CT.

## 2017-06-05 NOTE — Telephone Encounter (Signed)
Pt is scheduled for 09/06/17
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
## 2017-06-05 NOTE — Assessment & Plan Note (Signed)
Improved with daily flonase - continue.

## 2017-06-05 NOTE — Assessment & Plan Note (Signed)
By CT. Reviewed with patient. Emphasized importance of BP and HLD control.

## 2017-06-05 NOTE — Addendum Note (Signed)
Addended by: Brenton Grills on: 0/05/5995 74:14 PM   Modules accepted: Orders

## 2017-06-09 LAB — PAIN MGMT, PROFILE 8 W/CONF, U
6 Acetylmorphine: NEGATIVE ng/mL (ref <10)
Alcohol Metabolites: NEGATIVE ng/mL (ref <500)
Amphetamines: NEGATIVE ng/mL (ref <500)
Benzodiazepines: NEGATIVE ng/mL (ref <100)
Buprenorphine, Urine: NEGATIVE ng/mL (ref <5)
CODEINE: NEGATIVE ng/mL (ref <50)
Cocaine Metabolite: NEGATIVE ng/mL (ref <150)
Creatinine: 255.7 mg/dL
HYDROCODONE: 2076 ng/mL — AB (ref <50)
HYDROMORPHONE: 685 ng/mL — AB (ref <50)
MDMA: NEGATIVE ng/mL (ref <500)
Marijuana Metabolite: NEGATIVE ng/mL (ref <20)
Morphine: NEGATIVE ng/mL (ref <50)
Norhydrocodone: 2498 ng/mL — ABNORMAL HIGH (ref <50)
OXIDANT: NEGATIVE ug/mL (ref <200)
Opiates: POSITIVE ng/mL — AB (ref <100)
Oxycodone: NEGATIVE ng/mL (ref <100)
pH: 5.61 (ref 4.5–9.0)

## 2017-07-04 ENCOUNTER — Other Ambulatory Visit: Payer: Self-pay | Admitting: Family Medicine

## 2017-07-04 NOTE — Telephone Encounter (Signed)
Last OV 06/2017. Last Rx 06/04/2017

## 2017-07-04 NOTE — Telephone Encounter (Signed)
Copied from [place] [phone]. Topic: Quick Communication - Rx Refill/Question >> Jul 04, 2017 10:08 AM Scherrie Gerlach wrote: Medication: HYDROcodone-acetaminophen ([place]) 10-325 MG tablet [facility] [place] [place] AT [address] [phone] (Phone) [phone] (Fax)

## 2017-07-08 MED ORDER — HYDROCODONE-ACETAMINOPHEN 10-325 MG PO TABS: 1.0000 | ORAL_TABLET | Freq: Three times a day (TID) | ORAL | 0 refills | Status: DC | PRN

## 2017-07-08 NOTE — Telephone Encounter (Signed)
Eprescribed.

## 2017-07-09 ENCOUNTER — Telehealth: Payer: Self-pay | Admitting: Family Medicine

## 2017-07-09 DIAGNOSIS — M545 Low back pain: Principal | ICD-10-CM

## 2017-07-09 DIAGNOSIS — G8929 Other chronic pain: Secondary | ICD-10-CM

## 2017-07-09 NOTE — Telephone Encounter (Signed)
Pt dropped off 08/14/17 jury summons and is requesting a note to be exempt from service due to disability. Please call when ready for pick up. I placed in Rx tower.

## 2017-07-09 NOTE — Telephone Encounter (Signed)
Place in your in box 

## 2017-07-11 DIAGNOSIS — Z0279 Encounter for issue of other medical certificate: Secondary | ICD-10-CM

## 2017-07-11 NOTE — Telephone Encounter (Addendum)
Left message on vm per dpr notifying pt the jury duty letter is ready to pick up.  Also, there is a $5.00 fee for the letter which he will be billed for.  [Placed letter at front office.]

## 2017-07-11 NOTE — Telephone Encounter (Signed)
Letter written and in Lisa's box.  

## 2017-07-23 DIAGNOSIS — Z86010 Personal history of colonic polyps: Secondary | ICD-10-CM | POA: Diagnosis not present

## 2017-07-23 DIAGNOSIS — K439 Ventral hernia without obstruction or gangrene: Secondary | ICD-10-CM | POA: Diagnosis not present

## 2017-07-23 DIAGNOSIS — E669 Obesity, unspecified: Secondary | ICD-10-CM | POA: Diagnosis not present

## 2017-07-23 DIAGNOSIS — Z1211 Encounter for screening for malignant neoplasm of colon: Secondary | ICD-10-CM | POA: Diagnosis not present

## 2017-07-23 DIAGNOSIS — K219 Gastro-esophageal reflux disease without esophagitis: Secondary | ICD-10-CM | POA: Diagnosis not present

## 2017-07-23 DIAGNOSIS — K625 Hemorrhage of anus and rectum: Secondary | ICD-10-CM | POA: Diagnosis not present

## 2017-08-06 ENCOUNTER — Other Ambulatory Visit: Payer: Self-pay | Admitting: Family Medicine

## 2017-08-06 NOTE — Telephone Encounter (Signed)
Name of Medication: norco Name of Pharmacy: [facility] or Written Date and Quantity: 6/3 #40 Last Office Visit and Type: 06/05/2017 pain mgmt Next Office Visit and Type: 09/06/2017 29mo f/u Last Controlled Substance Agreement Date: 06/05/17 Last UDS:06/05/17

## 2017-08-06 NOTE — Telephone Encounter (Signed)
Copied from [place] [id number]. Topic: Quick Communication - See Telephone Encounter >> Aug 06, 2017  2:00 PM Vernona Rieger wrote: CRM for notification. See Telephone encounter for: 08/06/17.  HYDROcodone-acetaminophen (NORCO) 10-325 MG tablet [facility] [place] [place] AT [address]

## 2017-08-07 MED ORDER — HYDROCODONE-ACETAMINOPHEN 10-325 MG PO TABS: 1.0000 | ORAL_TABLET | Freq: Three times a day (TID) | ORAL | 0 refills | Status: DC | PRN

## 2017-08-07 NOTE — Telephone Encounter (Signed)
Eprescribed.

## 2017-08-16 DIAGNOSIS — H524 Presbyopia: Secondary | ICD-10-CM | POA: Diagnosis not present

## 2017-08-22 ENCOUNTER — Other Ambulatory Visit: Payer: Self-pay | Admitting: Family Medicine

## 2017-08-27 ENCOUNTER — Telehealth: Payer: Self-pay | Admitting: Family Medicine

## 2017-08-27 NOTE — Telephone Encounter (Signed)
Pt dropped off parking placard form to be filled out.Placed in RX tower °

## 2017-08-29 NOTE — Telephone Encounter (Signed)
Placed in Dr. G's box.  

## 2017-09-03 NOTE — Telephone Encounter (Signed)
Signed and in Lisa's box.  

## 2017-09-04 NOTE — Telephone Encounter (Signed)
Spoke with pt notifying him the form is ready to pick up.  [Placed form at front office.] 

## 2017-09-06 ENCOUNTER — Encounter: Payer: Self-pay | Admitting: Family Medicine

## 2017-09-06 ENCOUNTER — Ambulatory Visit (INDEPENDENT_AMBULATORY_CARE_PROVIDER_SITE_OTHER): Payer: Medicare HMO | Admitting: Family Medicine

## 2017-09-06 VITALS — BP 120/74 | HR 75 | Temp 98.4°F | Ht 75.0 in | Wt 259.8 lb

## 2017-09-06 DIAGNOSIS — G8929 Other chronic pain: Secondary | ICD-10-CM | POA: Diagnosis not present

## 2017-09-06 DIAGNOSIS — M545 Low back pain: Secondary | ICD-10-CM | POA: Diagnosis not present

## 2017-09-06 DIAGNOSIS — M542 Cervicalgia: Secondary | ICD-10-CM

## 2017-09-06 DIAGNOSIS — M25512 Pain in left shoulder: Secondary | ICD-10-CM | POA: Diagnosis not present

## 2017-09-06 MED ORDER — HYDROCODONE-ACETAMINOPHEN 10-325 MG PO TABS: 1.0000 | ORAL_TABLET | Freq: Three times a day (TID) | ORAL | 0 refills | Status: DC | PRN

## 2017-09-06 NOTE — Assessment & Plan Note (Signed)
I think he has L shoulder bursitis. Will refer to Dr Lorelei Pont for evaluation, consideration of steroid injection.

## 2017-09-06 NOTE — Patient Instructions (Addendum)
Good to see you today. You are doing well.  Schedule appointment with Dr Lorelei Pont sports medicine to check left shoulder pain - I think you have bursitis .  Return in 3 months for physical and wellness visit.

## 2017-09-06 NOTE — Assessment & Plan Note (Signed)
[place] CSRS reviewed and appropriate Pt tolerating medication well and it is effective. Continue. RTC 43mo CPE
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
## 2017-09-12 DIAGNOSIS — D485 Neoplasm of uncertain behavior of skin: Secondary | ICD-10-CM | POA: Diagnosis not present

## 2017-09-16 ENCOUNTER — Encounter: Payer: Self-pay | Admitting: Family Medicine

## 2017-09-16 ENCOUNTER — Encounter: Payer: Self-pay | Admitting: *Deleted

## 2017-09-16 ENCOUNTER — Ambulatory Visit (INDEPENDENT_AMBULATORY_CARE_PROVIDER_SITE_OTHER): Payer: Medicare HMO | Admitting: Family Medicine

## 2017-09-16 VITALS — BP 138/80 | HR 74 | Temp 98.4°F | Ht 75.0 in | Wt 257.5 lb

## 2017-09-16 DIAGNOSIS — M7552 Bursitis of left shoulder: Secondary | ICD-10-CM

## 2017-09-16 DIAGNOSIS — M7582 Other shoulder lesions, left shoulder: Secondary | ICD-10-CM | POA: Diagnosis not present

## 2017-09-16 DIAGNOSIS — M542 Cervicalgia: Secondary | ICD-10-CM | POA: Diagnosis not present

## 2017-09-16 DIAGNOSIS — M7542 Impingement syndrome of left shoulder: Secondary | ICD-10-CM

## 2017-09-16 DIAGNOSIS — G8929 Other chronic pain: Secondary | ICD-10-CM | POA: Diagnosis not present

## 2017-09-16 MED ORDER — METHYLPREDNISOLONE ACETATE 40 MG/ML IJ SUSP
80.0000 mg | Freq: Once | INTRAMUSCULAR | Status: AC
  Administered 2017-09-16: 80 mg via INTRA_ARTICULAR
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
## 2017-09-16 NOTE — Patient Instructions (Signed)
REFERRALS TO SPECIALISTS, SPECIAL TESTS (MRI, CT, ULTRASOUNDS)  [facility] or  Anastasiya will help you. ASK CHECK-IN FOR HELP.  Specialist appointment times vary a great deal, based on their schedule / openings. -- Some specialists have very long wait times. (Example. Dermatology)    

## 2017-09-18 DIAGNOSIS — M25612 Stiffness of left shoulder, not elsewhere classified: Secondary | ICD-10-CM | POA: Diagnosis not present

## 2017-09-18 DIAGNOSIS — M25512 Pain in left shoulder: Secondary | ICD-10-CM | POA: Diagnosis not present

## 2017-09-18 DIAGNOSIS — M7542 Impingement syndrome of left shoulder: Secondary | ICD-10-CM | POA: Diagnosis not present

## 2017-09-18 DIAGNOSIS — M6281 Muscle weakness (generalized): Secondary | ICD-10-CM | POA: Diagnosis not present

## 2017-09-25 DIAGNOSIS — M25612 Stiffness of left shoulder, not elsewhere classified: Secondary | ICD-10-CM | POA: Diagnosis not present

## 2017-09-25 DIAGNOSIS — M7542 Impingement syndrome of left shoulder: Secondary | ICD-10-CM | POA: Diagnosis not present

## 2017-09-25 DIAGNOSIS — M6281 Muscle weakness (generalized): Secondary | ICD-10-CM | POA: Diagnosis not present

## 2017-09-25 DIAGNOSIS — M25512 Pain in left shoulder: Secondary | ICD-10-CM | POA: Diagnosis not present

## 2017-10-02 DIAGNOSIS — M25612 Stiffness of left shoulder, not elsewhere classified: Secondary | ICD-10-CM | POA: Diagnosis not present

## 2017-10-02 DIAGNOSIS — M25512 Pain in left shoulder: Secondary | ICD-10-CM | POA: Diagnosis not present

## 2017-10-02 DIAGNOSIS — M7542 Impingement syndrome of left shoulder: Secondary | ICD-10-CM | POA: Diagnosis not present

## 2017-10-02 DIAGNOSIS — M6281 Muscle weakness (generalized): Secondary | ICD-10-CM | POA: Diagnosis not present

## 2017-10-08 ENCOUNTER — Other Ambulatory Visit: Payer: Self-pay | Admitting: Family Medicine

## 2017-10-08 NOTE — Telephone Encounter (Signed)
Refill of Norco  LOV 09/16/17 Dr. Lorelei Pont  LRF 09/06/17  #40  0 refills  : [facility] [place], [place] AT [place] [address]         [phone] (Phone) [phone] (Fax)

## 2017-10-08 NOTE — Telephone Encounter (Signed)
Name of Medication: hydrocodone apap 10-325 Name of Pharmacy: [facility] or Written Date and Quantity: #40 on 09/06/17 Last Office Visit and Type: 09/16/17 shoulder pain Next Office Visit and Type: 10/08/17 6 wk FU Last Controlled Substance Agreement Date: 06/10/17 Last UDS:06/05/17

## 2017-10-08 NOTE — Telephone Encounter (Signed)
Copied from [place]. Topic: Quick Communication - Rx Refill/Question >> Oct 08, 2017  8:53 AM Mylinda Latina, NT wrote: Medication: HYDROcodone-acetaminophen ([place]) 10-325 MG tablet   Has the patient contacted their pharmacy? No. (Agent: If no, request that the patient contact the pharmacy for the refill.) (Agent: If yes, when and what did the pharmacy advise?)  Preferred Pharmacy (with phone number or street name): [facility], [place] - [place] [phone] (Phone) [phone] (Fax)    Agent: Please be advised that RX refills may take up to 3 business days. We ask that you follow-up with your pharmacy.

## 2017-10-09 DIAGNOSIS — M7542 Impingement syndrome of left shoulder: Secondary | ICD-10-CM | POA: Diagnosis not present

## 2017-10-09 DIAGNOSIS — M25612 Stiffness of left shoulder, not elsewhere classified: Secondary | ICD-10-CM | POA: Diagnosis not present

## 2017-10-09 DIAGNOSIS — M25512 Pain in left shoulder: Secondary | ICD-10-CM | POA: Diagnosis not present

## 2017-10-09 DIAGNOSIS — M6281 Muscle weakness (generalized): Secondary | ICD-10-CM | POA: Diagnosis not present

## 2017-10-09 MED ORDER — HYDROCODONE-ACETAMINOPHEN 10-325 MG PO TABS: 1.0000 | ORAL_TABLET | Freq: Three times a day (TID) | ORAL | 0 refills | Status: DC | PRN

## 2017-10-09 NOTE — Telephone Encounter (Signed)
Celebrex last filled:  04/18/17, #90 Colcrys last filled:  04/01/17, #30 Gabapentin last filled:  08/10/16, #270 Last OV:  09/16/17, acute Next OV:  10/28/17, f/u with Dr. Lorelei Pont then 12/12/17, CPE with Dr. Darnell Level.

## 2017-10-09 NOTE — Telephone Encounter (Signed)
Eprescribed.

## 2017-10-09 NOTE — Telephone Encounter (Signed)
Pt notified as instructed and voiced understanding and pt will pick up rx at [facility].

## 2017-10-16 DIAGNOSIS — M25512 Pain in left shoulder: Secondary | ICD-10-CM | POA: Diagnosis not present

## 2017-10-16 DIAGNOSIS — M6281 Muscle weakness (generalized): Secondary | ICD-10-CM | POA: Diagnosis not present

## 2017-10-16 DIAGNOSIS — M25612 Stiffness of left shoulder, not elsewhere classified: Secondary | ICD-10-CM | POA: Diagnosis not present

## 2017-10-16 DIAGNOSIS — M7542 Impingement syndrome of left shoulder: Secondary | ICD-10-CM | POA: Diagnosis not present

## 2017-10-23 DIAGNOSIS — M25612 Stiffness of left shoulder, not elsewhere classified: Secondary | ICD-10-CM | POA: Diagnosis not present

## 2017-10-23 DIAGNOSIS — M25512 Pain in left shoulder: Secondary | ICD-10-CM | POA: Diagnosis not present

## 2017-10-23 DIAGNOSIS — M7542 Impingement syndrome of left shoulder: Secondary | ICD-10-CM | POA: Diagnosis not present

## 2017-10-23 DIAGNOSIS — M6281 Muscle weakness (generalized): Secondary | ICD-10-CM | POA: Diagnosis not present

## 2017-10-28 ENCOUNTER — Other Ambulatory Visit: Payer: Self-pay | Admitting: Family Medicine

## 2017-10-28 ENCOUNTER — Encounter: Payer: Self-pay | Admitting: Family Medicine

## 2017-10-28 ENCOUNTER — Ambulatory Visit (INDEPENDENT_AMBULATORY_CARE_PROVIDER_SITE_OTHER): Payer: Medicare HMO | Admitting: Family Medicine

## 2017-10-28 VITALS — BP 120/82 | HR 82 | Temp 98.4°F | Ht 75.0 in | Wt 256.5 lb

## 2017-10-28 DIAGNOSIS — M542 Cervicalgia: Secondary | ICD-10-CM | POA: Diagnosis not present

## 2017-10-28 DIAGNOSIS — M7582 Other shoulder lesions, left shoulder: Secondary | ICD-10-CM | POA: Diagnosis not present

## 2017-10-28 DIAGNOSIS — Z23 Encounter for immunization: Secondary | ICD-10-CM | POA: Diagnosis not present

## 2017-10-28 DIAGNOSIS — M7552 Bursitis of left shoulder: Secondary | ICD-10-CM | POA: Diagnosis not present

## 2017-10-28 DIAGNOSIS — M7542 Impingement syndrome of left shoulder: Secondary | ICD-10-CM

## 2017-10-28 DIAGNOSIS — G8929 Other chronic pain: Secondary | ICD-10-CM
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
## 2017-10-30 DIAGNOSIS — M25612 Stiffness of left shoulder, not elsewhere classified: Secondary | ICD-10-CM | POA: Diagnosis not present

## 2017-10-30 DIAGNOSIS — M7542 Impingement syndrome of left shoulder: Secondary | ICD-10-CM | POA: Diagnosis not present

## 2017-10-30 DIAGNOSIS — M25512 Pain in left shoulder: Secondary | ICD-10-CM | POA: Diagnosis not present

## 2017-10-30 DIAGNOSIS — M6281 Muscle weakness (generalized): Secondary | ICD-10-CM | POA: Diagnosis not present

## 2017-11-06 DIAGNOSIS — M25512 Pain in left shoulder: Secondary | ICD-10-CM | POA: Diagnosis not present

## 2017-11-06 DIAGNOSIS — M6281 Muscle weakness (generalized): Secondary | ICD-10-CM | POA: Diagnosis not present

## 2017-11-06 DIAGNOSIS — M25612 Stiffness of left shoulder, not elsewhere classified: Secondary | ICD-10-CM | POA: Diagnosis not present

## 2017-11-06 DIAGNOSIS — M7542 Impingement syndrome of left shoulder: Secondary | ICD-10-CM | POA: Diagnosis not present

## 2017-11-08 ENCOUNTER — Other Ambulatory Visit: Payer: Self-pay | Admitting: Family Medicine

## 2017-11-08 NOTE — Telephone Encounter (Signed)
Name of Medication: Hydrocodone apap 10-325 Name of Pharmacy: [facility] Last Fill or Written Date and Quantity: # 40 on 10/09/17 Last Office Visit and Type: 09/06/17 FU pain mgt Next Office Visit and Type: 12/12/17 CPX Last Controlled Substance Agreement Date: 06/10/17 Last UDS:06/05/17

## 2017-11-08 NOTE — Telephone Encounter (Signed)
Copied from [place]. Topic: Quick Communication - Rx Refill/Question >> Nov 08, 2017  9:54 AM Oliver Pila B wrote: Medication: HYDROcodone-acetaminophen ([place]) 10-325 MG tablet [[id number]]   Has the patient contacted their pharmacy? Yes.   (Agent: If no, request that the patient contact the pharmacy for the refill.) (Agent: If yes, when and what did the pharmacy advise?)  Preferred Pharmacy (with phone number or street name): [facility]  Agent: Please be advised that RX refills may take up to 3 business days. We ask that you follow-up with your pharmacy.

## 2017-11-09 MED ORDER — HYDROCODONE-ACETAMINOPHEN 10-325 MG PO TABS: 1.0000 | ORAL_TABLET | Freq: Three times a day (TID) | ORAL | 0 refills | Status: DC | PRN

## 2017-11-09 NOTE — Telephone Encounter (Signed)
Eprescribed.

## 2017-11-12 DIAGNOSIS — D485 Neoplasm of uncertain behavior of skin: Secondary | ICD-10-CM | POA: Diagnosis not present

## 2017-11-12 DIAGNOSIS — H04563 Stenosis of bilateral lacrimal punctum: Secondary | ICD-10-CM | POA: Diagnosis not present

## 2017-11-12 DIAGNOSIS — D2310 Other benign neoplasm of skin of unspecified eyelid, including canthus: Secondary | ICD-10-CM | POA: Diagnosis not present

## 2017-11-20 DIAGNOSIS — M25612 Stiffness of left shoulder, not elsewhere classified: Secondary | ICD-10-CM | POA: Diagnosis not present

## 2017-11-20 DIAGNOSIS — M25512 Pain in left shoulder: Secondary | ICD-10-CM | POA: Diagnosis not present

## 2017-11-20 DIAGNOSIS — M7542 Impingement syndrome of left shoulder: Secondary | ICD-10-CM | POA: Diagnosis not present

## 2017-11-20 DIAGNOSIS — M6281 Muscle weakness (generalized): Secondary | ICD-10-CM | POA: Diagnosis not present

## 2017-12-01 ENCOUNTER — Other Ambulatory Visit: Payer: Self-pay | Admitting: Family Medicine

## 2017-12-02 ENCOUNTER — Other Ambulatory Visit: Payer: Self-pay | Admitting: Family Medicine

## 2017-12-02 DIAGNOSIS — M109 Gout, unspecified: Secondary | ICD-10-CM

## 2017-12-02 DIAGNOSIS — E785 Hyperlipidemia, unspecified: Secondary | ICD-10-CM

## 2017-12-02 DIAGNOSIS — Z125 Encounter for screening for malignant neoplasm of prostate: Secondary | ICD-10-CM

## 2017-12-02 NOTE — Telephone Encounter (Signed)
[facility] Last filled:  10/11/17, #30 Last OV:  10/28/17, f/u with Dr. Lorelei Pont Next OV:  12/12/17, CPE

## 2017-12-04 ENCOUNTER — Ambulatory Visit: Payer: Medicare HMO

## 2017-12-04 ENCOUNTER — Ambulatory Visit (INDEPENDENT_AMBULATORY_CARE_PROVIDER_SITE_OTHER): Payer: Medicare HMO

## 2017-12-04 VITALS — BP 122/90 | HR 65 | Temp 98.6°F | Ht 74.5 in | Wt 256.5 lb

## 2017-12-04 DIAGNOSIS — M109 Gout, unspecified: Secondary | ICD-10-CM | POA: Diagnosis not present

## 2017-12-04 DIAGNOSIS — E785 Hyperlipidemia, unspecified: Secondary | ICD-10-CM | POA: Diagnosis not present

## 2017-12-04 DIAGNOSIS — Z0000 Encounter for general adult medical examination without abnormal findings: Secondary | ICD-10-CM | POA: Diagnosis not present

## 2017-12-04 DIAGNOSIS — Z125 Encounter for screening for malignant neoplasm of prostate: Secondary | ICD-10-CM | POA: Diagnosis not present

## 2017-12-04 LAB — COMPREHENSIVE METABOLIC PANEL
ALBUMIN: 4.6 g/dL (ref 3.5–5.2)
ALK PHOS: 56 U/L (ref 39–117)
ALT: 17 U/L (ref 0–53)
AST: 22 U/L (ref 0–37)
BUN: 10 mg/dL (ref 6–23)
CO2: 29 meq/L (ref 19–32)
Calcium: 9.6 mg/dL (ref 8.4–10.5)
Chloride: 103 mEq/L (ref 96–112)
Creatinine, Ser: 1.2 mg/dL (ref 0.40–1.50)
GFR: 79.45 mL/min (ref >60.00)
GLUCOSE: 96 mg/dL (ref 70–99)
POTASSIUM: 3.8 meq/L (ref 3.5–5.1)
SODIUM: 139 meq/L (ref 135–145)
TOTAL PROTEIN: 7.1 g/dL (ref 6.0–8.3)
Total Bilirubin: 0.7 mg/dL (ref 0.2–1.2)

## 2017-12-04 LAB — LIPID PANEL
CHOL/HDL RATIO: 4
Cholesterol: 211 mg/dL — ABNORMAL HIGH (ref 0–200)
HDL: 59.2 mg/dL (ref >39.00)
LDL Cholesterol: 138 mg/dL — ABNORMAL HIGH (ref 0–99)
NONHDL: 151.68
Triglycerides: 70 mg/dL (ref 0.0–149.0)
VLDL: 14 mg/dL (ref 0.0–40.0)

## 2017-12-04 LAB — PSA: PSA: 0.78 ng/mL (ref 0.10–4.00)

## 2017-12-04 LAB — URIC ACID: Uric Acid, Serum: 7.3 mg/dL (ref 4.0–7.8)

## 2017-12-04 NOTE — Progress Notes (Signed)
PCP notes:   Health maintenance:  No gaps identified.  Abnormal screenings:   Depression score: 11 Depression screen [facility] 2/9 12/04/2017 11/30/2016 03/21/2015 03/18/2014 09/09/2012  Decreased Interest 0 3 2 1  0  Down, Depressed, Hopeless 3 3 2 1 1   PHQ - 2 Score 3 6 4 2 1   Altered sleeping 3 1 2 3  -  Tired, decreased energy 1 3 1  0 -  Change in appetite 0 0 0 0 -  Feeling bad or failure about yourself  1 1 1  0 -  Trouble concentrating 3 2 1 1  -  Moving slowly or fidgety/restless 0 1 2 0 -  Suicidal thoughts 0 0 0 0 -  PHQ-9 Score 11 14 11 6  -  Difficult doing work/chores Somewhat difficult Somewhat difficult Somewhat difficult - -   Mini-Cog score: 18/20 MMSE - Mini Mental State Exam 12/04/2017 11/30/2016  Orientation to time 5 5  Orientation to Place 5 5  Registration 3 3  Attention/ Calculation 0 0  Recall 1 2  Recall-comments unable to recall 2 of 3 words unable to recall 1 of 3 words  Language- name 2 objects 0 0  Language- repeat 1 1  Language- follow 3 step command 3 3  Language- read & follow direction 0 0  Write a sentence 0 0  Copy design 0 0  Total score 18 19   Fall risk - hx of single fall Fall Risk  12/04/2017 11/30/2016 03/21/2015 03/18/2014 09/09/2012  Falls in the past year? Yes No No No No  Comment fell while walking down steps - - - -  Number falls in past yr: 1 - - - -  Injury with Fall? Yes - - - -  Comment laceration to left arm - - - -     Patient concerns:   Chronic low back pain  Nurse concerns:  None  Next PCP appt:   12/12/17 @ 1130
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
## 2017-12-04 NOTE — Patient Instructions (Signed)
Rodney Charles , Thank you for taking time to come for your Medicare Wellness Visit. I appreciate your ongoing commitment to your health goals. Please review the following plan we discussed and let me know if I can assist you in the future.   These are the goals we discussed: Goals    . Increase physical activity     Starting 10/30/2019and as weather permits, I will continue to walk at least 30 minutes daily.        This is a list of the screening recommended for you and due dates:  Health Maintenance  Topic Date Due  . HIV Screening  11/30/2048*  . Colon Cancer Screening  04/07/2019  . DTaP/Tdap/Td vaccine (2 - Td) 09/10/2022  . Tetanus Vaccine  09/10/2022  . Flu Shot  Completed  .  Hepatitis C: One time screening is recommended by Center for Disease Control  (CDC) for  adults born from [birth year] through 1963.   Completed  *Topic was postponed. The date shown is not the original due date.   Preventive Care for Adults  A healthy lifestyle and preventive care can promote health and wellness. Preventive health guidelines for adults include the following key practices.  . A routine yearly physical is a good way to check with your health care provider about your health and preventive screening. It is a chance to share any concerns and updates on your health and to receive a thorough exam.  . Visit your dentist for a routine exam and preventive care every 6 months. Brush your teeth twice a day and floss once a day. Good oral hygiene prevents tooth decay and gum disease.  . The frequency of eye exams is based on your age, health, family medical history, use  of contact lenses, and other factors. Follow your health care provider's recommendations for frequency of eye exams.  . Eat a healthy diet. Foods like vegetables, fruits, whole grains, low-fat dairy products, and lean protein foods contain the nutrients you need without too many calories. Decrease your intake of foods high in solid fats,  added sugars, and salt. Eat the right amount of calories for you. Get information about a proper diet from your health care provider, if necessary.  . Regular physical exercise is one of the most important things you can do for your health. Most adults should get at least 150 minutes of moderate-intensity exercise (any activity that increases your heart rate and causes you to sweat) each week. In addition, most adults need muscle-strengthening exercises on 2 or more days a week.  Silver Sneakers may be a benefit available to you. To determine eligibility, you may visit the website: www.silversneakers.com or contact program at [phone]) [phone] Mon-Fri between 8AM-8PM.   . Maintain a healthy weight. The body mass index (BMI) is a screening tool to identify possible weight problems. It provides an estimate of body fat based on height and weight. Your health care provider can find your BMI and can help you achieve or maintain a healthy weight.   For adults 20 years and older: ? A BMI below 18.5 is considered underweight. ? A BMI of 18.5 to 24.9 is normal. ? A BMI of 25 to 29.9 is considered overweight. ? A BMI of 30 and above is considered obese.   . Maintain normal blood lipids and cholesterol levels by exercising and minimizing your intake of saturated fat. Eat a balanced diet with plenty of fruit and vegetables. Blood tests for lipids and cholesterol should begin at  age 41 and be repeated every 5 years. If your lipid or cholesterol levels are high, you are over 50, or you are at high risk for heart disease, you may need your cholesterol levels checked more frequently. Ongoing high lipid and cholesterol levels should be treated with medicines if diet and exercise are not working.  . If you smoke, find out from your health care provider how to quit. If you do not use tobacco, please do not start.  . If you choose to drink alcohol, please do not consume more than 2 drinks per day. One drink is  considered to be 12 ounces (355 mL) of beer, 5 ounces (148 mL) of wine, or 1.5 ounces (44 mL) of liquor.  . If you are 61-37 years old, ask your health care provider if you should take aspirin to prevent strokes.  . Use sunscreen. Apply sunscreen liberally and repeatedly throughout the day. You should seek shade when your shadow is shorter than you. Protect yourself by wearing long sleeves, pants, a wide-brimmed hat, and sunglasses year round, whenever you are outdoors.  . Once a month, do a whole body skin exam, using a mirror to look at the skin on your back. Tell your health care provider of new moles, moles that have irregular borders, moles that are larger than a pencil eraser, or moles that have changed in shape or color.

## 2017-12-10 ENCOUNTER — Other Ambulatory Visit: Payer: Self-pay | Admitting: *Deleted

## 2017-12-10 NOTE — Telephone Encounter (Signed)
Name of Medication: Hydrocodone-APAP Name of Pharmacy: [place] or Written Date and Quantity: 11/09/17, #40/0 Last Office Visit and Type: 10/28/17, f/u with Dr. Lorelei Pont Next Office Visit and Type: 12/12/17, CPE Last Controlled Substance Agreement Date: 06/05/17 Last UDS: 06/05/17

## 2017-12-11 ENCOUNTER — Encounter: Payer: Self-pay | Admitting: Family Medicine

## 2017-12-11 DIAGNOSIS — M7542 Impingement syndrome of left shoulder: Secondary | ICD-10-CM | POA: Diagnosis not present

## 2017-12-11 DIAGNOSIS — M25512 Pain in left shoulder: Secondary | ICD-10-CM | POA: Diagnosis not present

## 2017-12-11 DIAGNOSIS — M6281 Muscle weakness (generalized): Secondary | ICD-10-CM | POA: Diagnosis not present

## 2017-12-11 DIAGNOSIS — M25612 Stiffness of left shoulder, not elsewhere classified: Secondary | ICD-10-CM | POA: Diagnosis not present

## 2017-12-11 MED ORDER — HYDROCODONE-ACETAMINOPHEN 10-325 MG PO TABS: 1.0000 | ORAL_TABLET | Freq: Three times a day (TID) | ORAL | 0 refills | Status: DC | PRN

## 2017-12-11 NOTE — Progress Notes (Signed)
BP 120/70 (BP Location: Left Arm, Patient Position: Sitting, Cuff Size: Normal)   Pulse 60   Temp 98.4 F (36.9 C) (Oral)   Ht 6' 2.5" (1.892 m)   Wt 259 lb 4 oz (117.6 kg)   SpO2 95%   BMI 32.84 kg/m    CC: CPE Subjective:    Patient ID: Rodney Charles, male    DOB: 04-10-1955, 62 y.o.   MRN: [id number]  HPI: Rodney Charles is a 62 y.o. male presenting on 12/12/2017 for Annual Exam (Pt 2.)   Saw Katha Cabal last week for medicare wellness visit. Note reviewed.   Father died 2 wks ago suddenly ?MI (had pacemaker).   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 04/2014 WNL, rpt 5 yrs [facility]) Prostate cancer screening - discussed, would like screening. No fmhx prostates cancer. Nocturia 2x/night. Strong stream. Lung cancer screening - undergoing Flu shot yearly Tdap 09/2012 Advanced directives: does not have set up. Thinks sister Levada Dy would be HCPOA. Packet provided today  Seat belt use discussed Sunscreen use discussed. No changing moles on skin Ex smoker quit 2005 Alcohol - none Dentist yearly Eye exam yearly  Lives alone, dog  Occupation: disability for lumbar and neck pain (~2007) Handicap placard - unable to walk >257ft w/o stopping to rest Edu: HS  Activity: walks 15 min daily  Diet: good water, fruits/vegetables daily  Relevant past medical, surgical, family and social history reviewed and updated as indicated. Interim medical history since our last visit reviewed. Allergies and medications reviewed and updated. Outpatient Medications Prior to Visit  Medication Sig Dispense Refill  . acetaminophen (TYLENOL) 500 MG tablet Take 500 mg by mouth every 6 (six) hours as needed.    . celecoxib (CELEBREX) 100 MG capsule TAKE 1 CAPSULE(100 MG) BY MOUTH DAILY 90 capsule 0  . COLCRYS 0.6 MG tablet TAKE 1 TABLET(0.6 MG) BY MOUTH DAILY AS NEEDED FOR JOINT PAIN 30 tablet 0  . esomeprazole (NEXIUM) 40 MG capsule Take 40 mg by mouth daily before breakfast.      . fluticasone (FLONASE) 50 MCG/ACT nasal spray Place 2 sprays into both nostrils daily. 16 g 11  . gabapentin (NEURONTIN) 300 MG capsule TAKE 1 CAPSULE(300 MG) BY MOUTH THREE TIMES DAILY 270 capsule 0  . HYDROcodone-acetaminophen (NORCO) 10-325 MG tablet Take 1 tablet by mouth every 8 (eight) hours as needed. 40 tablet 0  . Menthol, Topical Analgesic, (BIOFREEZE EX) Apply topically as directed.    . methocarbamol (ROBAXIN) 500 MG tablet Take 1 tablet (500 mg total) by mouth 2 (two) times daily as needed for muscle spasms. 50 tablet 3  . Multiple Vitamin (MULTIVITAMIN) tablet Take 1 tablet by mouth daily.    Marland Kitchen omeprazole (PRILOSEC) 40 MG capsule TK ONE C PO  ONCE A DAY  4  . rosuvastatin (CRESTOR) 5 MG tablet TAKE 1 TABLET(5 MG) BY MOUTH EVERY OTHER DAY 30 tablet 4  . sertraline (ZOLOFT) 25 MG tablet TAKE 1 TABLET(25 MG) BY MOUTH DAILY 30 tablet 2  . nortriptyline (PAMELOR) 10 MG capsule Take 1 capsule (10 mg total) by mouth at bedtime. 90 capsule 1   No facility-administered medications prior to visit.      Per HPI unless specifically indicated in ROS section below Review of Systems  Constitutional: Negative for activity change, appetite change, chills, fatigue, fever and unexpected weight change.  HENT: Negative for hearing loss.   Eyes: Negative for visual disturbance.  Respiratory: Negative for cough, chest tightness, shortness of breath  and wheezing.   Cardiovascular: Negative for chest pain, palpitations and leg swelling.  Gastrointestinal: Negative for abdominal distention, abdominal pain, blood in stool, constipation, diarrhea, nausea and vomiting.  Genitourinary: Negative for difficulty urinating and hematuria.  Musculoskeletal: Positive for back pain (chronic). Negative for arthralgias, myalgias and neck pain.  Skin: Negative for rash.  Neurological: Negative for dizziness, seizures, syncope and headaches.  Hematological: Negative for adenopathy. Does not bruise/bleed easily.   Psychiatric/Behavioral: Positive for dysphoric mood. The patient is nervous/anxious.        Objective:    BP 120/70 (BP Location: Left Arm, Patient Position: Sitting, Cuff Size: Normal)   Pulse 60   Temp 98.4 F (36.9 C) (Oral)   Ht 6' 2.5" (1.892 m)   Wt 259 lb 4 oz (117.6 kg)   SpO2 95%   BMI 32.84 kg/m   Wt Readings from Last 3 Encounters:  12/12/17 259 lb 4 oz (117.6 kg)  12/04/17 256 lb 8 oz (116.3 kg)  10/28/17 256 lb 8 oz (116.3 kg)    Physical Exam  Constitutional: He is oriented to person, place, and time. He appears well-developed and well-nourished. No distress.  HENT:  Head: Normocephalic and atraumatic.  Right Ear: Hearing, tympanic membrane, external ear and ear canal normal.  Left Ear: Hearing, tympanic membrane, external ear and ear canal normal.  Nose: Nose normal.  Mouth/Throat: Uvula is midline, oropharynx is clear and moist and mucous membranes are normal. No oropharyngeal exudate, posterior oropharyngeal edema or posterior oropharyngeal erythema.  Eyes: Pupils are equal, round, and reactive to light. Conjunctivae and EOM are normal. No scleral icterus.  Neck: Normal range of motion. Neck supple.  Cardiovascular: Normal rate, regular rhythm, normal heart sounds and intact distal pulses.  No murmur heard. Pulses:      Radial pulses are 2+ on the right side, and 2+ on the left side.  Pulmonary/Chest: Effort normal and breath sounds normal. No respiratory distress. He has no wheezes. He has no rales.  Abdominal: Soft. Bowel sounds are normal. He exhibits no distension and no mass. There is no tenderness. There is no rebound and no guarding.  Genitourinary: Rectum normal and prostate normal. Rectal exam shows no external hemorrhoid, no internal hemorrhoid, no fissure, no mass, no tenderness and anal tone normal. Prostate is not enlarged (15gm) and not tender.  Musculoskeletal: Normal range of motion. He exhibits no edema.  Lymphadenopathy:    He has no  cervical adenopathy.  Neurological: He is alert and oriented to person, place, and time.  CN grossly intact, station and gait intact  Skin: Skin is warm and dry. No rash noted.  Psychiatric: He has a normal mood and affect. His behavior is normal. Judgment and thought content normal.  Nursing note and vitals reviewed.  Results for orders placed or performed in visit on 12/04/17  Uric acid  Result Value Ref Range   Uric Acid, Serum 7.3 4.0 - 7.8 mg/dL  PSA  Result Value Ref Range   PSA 0.78 0.10 - 4.00 ng/mL  Comprehensive metabolic panel  Result Value Ref Range   Sodium 139 135 - 145 mEq/L   Potassium 3.8 3.5 - 5.1 mEq/L   Chloride 103 96 - 112 mEq/L   CO2 29 19 - 32 mEq/L   Glucose, Bld 96 70 - 99 mg/dL   BUN 10 6 - 23 mg/dL   Creatinine, Ser 1.20 0.40 - 1.50 mg/dL   Total Bilirubin 0.7 0.2 - 1.2 mg/dL   Alkaline Phosphatase 56  39 - 117 U/L   AST 22 0 - 37 U/L   ALT 17 0 - 53 U/L   Total Protein 7.1 6.0 - 8.3 g/dL   Albumin 4.6 3.5 - 5.2 g/dL   Calcium 9.6 8.4 - 10.5 mg/dL   GFR 79.45 >60.00 mL/min  Lipid panel  Result Value Ref Range   Cholesterol 211 (H) 0 - 200 mg/dL   Triglycerides 70.0 0.0 - 149.0 mg/dL   HDL 59.20 >39.00 mg/dL   VLDL 14.0 0.0 - 40.0 mg/dL   LDL Cholesterol 138 (H) 0 - 99 mg/dL   Total CHOL/HDL Ratio 4    NonHDL 151.68       Assessment & Plan:   Problem List Items Addressed This Visit    Obesity, Class I, BMI 30-34.9    Encouraged ongoing efforts at sustainable weight loss through healthy diet and lifestyle changes.       Mood disorder (HCC)    Continue sertraline 25mg , increase pamelor to 25mg  nightly.       HLD (hyperlipidemia)    Chronic, stable on QOD crestor - continue. The 10-year ASCVD risk score Mikey Bussing DC Brooke Bonito., et al., 2013) is: 6.8%   Values used to calculate the score:     Age: 66 years     Sex: Male     Is Non-Hispanic African American: Yes     Diabetic: No     Tobacco smoker: No     Systolic Blood Pressure: 759 mmHg      Is BP treated: No     HDL Cholesterol: 59.2 mg/dL     Total Cholesterol: 211 mg/dL       Health maintenance examination - Primary    Preventative protocols reviewed and updated unless pt declined. Discussed healthy diet and lifestyle.       Encounter for chronic pain management    [place] CSRS reviewed.       Chronic lower back pain    Continue celebrex, tylenol, narcotic, robaxin. Will trial higher nortriptyline dose 25mg  nightly. He did have trouble tolerating amitriptyline prior.       CAD (coronary artery disease)    Continue crestor.       Aortic atherosclerosis (HCC)    Continue crestor.           Meds ordered this encounter  Medications  . nortriptyline (PAMELOR) 25 MG capsule    Sig: Take 1 capsule (25 mg total) by mouth at bedtime.    Dispense:  90 capsule    Refill:  1   No orders of the defined types were placed in this encounter.   Follow up plan: Return in about 3 months (around 03/14/2018) for follow up visit.  Ria Bush, MD

## 2017-12-11 NOTE — Telephone Encounter (Signed)
Eprescribed.

## 2017-12-11 NOTE — Assessment & Plan Note (Signed)
Preventative protocols reviewed and updated unless pt declined. Discussed healthy diet and lifestyle.  

## 2017-12-12 ENCOUNTER — Encounter: Payer: Self-pay | Admitting: Family Medicine

## 2017-12-12 ENCOUNTER — Ambulatory Visit (INDEPENDENT_AMBULATORY_CARE_PROVIDER_SITE_OTHER): Payer: Medicare HMO | Admitting: Family Medicine

## 2017-12-12 VITALS — BP 120/70 | HR 60 | Temp 98.4°F | Ht 74.5 in | Wt 259.2 lb

## 2017-12-12 DIAGNOSIS — Z0000 Encounter for general adult medical examination without abnormal findings: Secondary | ICD-10-CM

## 2017-12-12 DIAGNOSIS — M545 Low back pain: Secondary | ICD-10-CM | POA: Diagnosis not present

## 2017-12-12 DIAGNOSIS — I2510 Atherosclerotic heart disease of native coronary artery without angina pectoris: Secondary | ICD-10-CM

## 2017-12-12 DIAGNOSIS — G8929 Other chronic pain: Secondary | ICD-10-CM | POA: Diagnosis not present

## 2017-12-12 DIAGNOSIS — F39 Unspecified mood [affective] disorder: Secondary | ICD-10-CM | POA: Diagnosis not present

## 2017-12-12 DIAGNOSIS — E785 Hyperlipidemia, unspecified: Secondary | ICD-10-CM | POA: Diagnosis not present

## 2017-12-12 DIAGNOSIS — E669 Obesity, unspecified: Secondary | ICD-10-CM | POA: Diagnosis not present

## 2017-12-12 DIAGNOSIS — I700 Atherosclerosis of aorta: Secondary | ICD-10-CM | POA: Diagnosis not present

## 2017-12-12 MED ORDER — NORTRIPTYLINE HCL 25 MG PO CAPS: 25.0000 mg | ORAL_CAPSULE | Freq: Every day | ORAL | 1 refills | Status: DC

## 2017-12-12 NOTE — Assessment & Plan Note (Signed)
Chronic, stable on QOD crestor - continue. The 10-year ASCVD risk score Mikey Bussing DC Brooke Bonito., et al., 2013) is: 6.8%   Values used to calculate the score:     Age: 62 years     Sex: Male     Is Non-Hispanic African American: Yes     Diabetic: No     Tobacco smoker: No     Systolic Blood Pressure: 056 mmHg     Is BP treated: No     HDL Cholesterol: 59.2 mg/dL     Total Cholesterol: 211 mg/dL

## 2017-12-12 NOTE — Assessment & Plan Note (Signed)
Continue crestor 

## 2017-12-12 NOTE — Assessment & Plan Note (Signed)
Continue sertraline 25mg , increase pamelor to 25mg  nightly.

## 2017-12-12 NOTE — Assessment & Plan Note (Signed)
Continue celebrex, tylenol, narcotic, robaxin. Will trial higher nortriptyline dose 25mg  nightly. He did have trouble tolerating amitriptyline prior.

## 2017-12-12 NOTE — Assessment & Plan Note (Signed)
Encouraged ongoing efforts at sustainable weight loss through healthy diet and lifestyle changes.

## 2017-12-12 NOTE — Assessment & Plan Note (Signed)
[place] CSRS reviewed  ?

## 2017-12-12 NOTE — Patient Instructions (Addendum)
Increase nortriptyline at bedtime to 25mg .  Work on [employer] - new packet provided today. Bring me copy of your advanced directive.  Continue other medicines. We will watch cholesterol levels.  Return as needed or in 3 months for chronic pain visit.   Health Maintenance, Male A healthy lifestyle and preventive care is important for your health and wellness. Ask your health care provider about what schedule of regular examinations is right for you. What should I know about weight and diet? Eat a Healthy Diet  Eat plenty of vegetables, fruits, whole grains, low-fat dairy products, and lean protein.  Do not eat a lot of foods high in solid fats, added sugars, or salt.  Maintain a Healthy Weight Regular exercise can help you achieve or maintain a healthy weight. You should:  Do at least 150 minutes of exercise each week. The exercise should increase your heart rate and make you sweat (moderate-intensity exercise).  Do strength-training exercises at least twice a week.  Watch Your Levels of Cholesterol and Blood Lipids  Have your blood tested for lipids and cholesterol every 5 years starting at 60 years of age. If you are at high risk for heart disease, you should start having your blood tested when you are 60 years old. You may need to have your cholesterol levels checked more often if: ? Your lipid or cholesterol levels are high. ? You are older than 60 years of age. ? You are at high risk for heart disease.  What should I know about cancer screening? Many types of cancers can be detected early and may often be prevented. Lung Cancer  You should be screened every year for lung cancer if: ? You are a current smoker who has smoked for at least 30 years. ? You are a former smoker who has quit within the past 15 years.  Talk to your health care provider about your screening options, when you should start screening, and how often you should be screened.  Colorectal  Cancer  Routine colorectal cancer screening usually begins at 60 years of age and should be repeated every 5-10 years until you are 60 years old. You may need to be screened more often if early forms of precancerous polyps or small growths are found. Your health care provider may recommend screening at an earlier age if you have risk factors for colon cancer.  Your health care provider may recommend using home test kits to check for hidden blood in the stool.  A small camera at the end of a tube can be used to examine your colon (sigmoidoscopy or colonoscopy). This checks for the earliest forms of colorectal cancer.  Prostate and Testicular Cancer  Depending on your age and overall health, your health care provider may do certain tests to screen for prostate and testicular cancer.  Talk to your health care provider about any symptoms or concerns you have about testicular or prostate cancer.  Skin Cancer  Check your skin from head to toe regularly.  Tell your health care provider about any new moles or changes in moles, especially if: ? There is a change in a mole's size, shape, or color. ? You have a mole that is larger than a pencil eraser.  Always use sunscreen. Apply sunscreen liberally and repeat throughout the day.  Protect yourself by wearing long sleeves, pants, a wide-brimmed hat, and sunglasses when outside.  What should I know about heart disease, diabetes, and high blood pressure?  If you are  62-29 years of age, have your blood pressure checked every 3-5 years. If you are 26 years of age or older, have your blood pressure checked every year. You should have your blood pressure measured twice-once when you are at a hospital or clinic, and once when you are not at a hospital or clinic. Record the average of the two measurements. To check your blood pressure when you are not at a hospital or clinic, you can use: ? An automated blood pressure machine at a pharmacy. ? A home blood  pressure monitor.  Talk to your health care provider about your target blood pressure.  If you are between 90-30 years old, ask your health care provider if you should take aspirin to prevent heart disease.  Have regular diabetes screenings by checking your fasting blood sugar level. ? If you are at a normal weight and have a low risk for diabetes, have this test once every three years after the age of 79. ? If you are overweight and have a high risk for diabetes, consider being tested at a younger age or more often.  A one-time screening for abdominal aortic aneurysm (AAA) by ultrasound is recommended for men aged 80-75 years who are current or former smokers. What should I know about preventing infection? Hepatitis B If you have a higher risk for hepatitis B, you should be screened for this virus. Talk with your health care provider to find out if you are at risk for hepatitis B infection. Hepatitis C Blood testing is recommended for:  Everyone born from [birth year] through 1965.  Anyone with known risk factors for hepatitis C.  Sexually Transmitted Diseases (STDs)  You should be screened each year for STDs including gonorrhea and chlamydia if: ? You are sexually active and are younger than 59 years of age. ? You are older than 60 years of age and your health care provider tells you that you are at risk for this type of infection. ? Your sexual activity has changed since you were last screened and you are at an increased risk for chlamydia or gonorrhea. Ask your health care provider if you are at risk.  Talk with your health care provider about whether you are at high risk of being infected with HIV. Your health care provider may recommend a prescription medicine to help prevent HIV infection.  What else can I do?  Schedule regular health, dental, and eye exams.  Stay current with your vaccines (immunizations).  Do not use any tobacco products, such as cigarettes, chewing tobacco, and  e-cigarettes. If you need help quitting, ask your health care provider.  Limit alcohol intake to no more than 2 drinks per day. One drink equals 12 ounces of beer, 5 ounces of wine, or 1 ounces of hard liquor.  Do not use street drugs.  Do not share needles.  Ask your health care provider for help if you need support or information about quitting drugs.  Tell your health care provider if you often feel depressed.  Tell your health care provider if you have ever been abused or do not feel safe at home. This information is not intended to replace advice given to you by your health care provider. Make sure you discuss any questions you have with your health care provider. Document Released: 07/21/2007 Document Revised: 09/21/2015 Document Reviewed: 10/26/2014 Elsevier Interactive Patient Education  [employer].

## 2017-12-25 ENCOUNTER — Other Ambulatory Visit: Payer: Self-pay | Admitting: Family Medicine

## 2017-12-25 ENCOUNTER — Ambulatory Visit: Payer: Medicare HMO | Admitting: Sports Medicine

## 2017-12-25 ENCOUNTER — Ambulatory Visit: Payer: Medicare HMO | Admitting: Family Medicine

## 2017-12-25 NOTE — Telephone Encounter (Signed)
Celebrex- last filled:  10/11/17, #90/0 Colcrys- last filled:  12/03/17, #30/0 Gabapentin- last filled:  10/11/17, #270/0 Last OV:  12/12/17, CPE Next OV:  03/17/18, 3 mo pain f/u

## 2018-01-02 ENCOUNTER — Other Ambulatory Visit: Payer: Self-pay | Admitting: Family Medicine

## 2018-01-13 ENCOUNTER — Other Ambulatory Visit: Payer: Self-pay | Admitting: *Deleted

## 2018-01-13 NOTE — Telephone Encounter (Signed)
Name of Medication: Hydrocodone-APAP Name of Pharmacy: [facility] or Written Date and Quantity: 12/11/17, #40/0 Last Office Visit and Type: 12/12/17, CPE Next Office Visit and Type: 03/17/18, 3 mo pain f/u Last Controlled Substance Agreement Date: 06/05/17 Last UDS: 06/05/17

## 2018-01-14 MED ORDER — HYDROCODONE-ACETAMINOPHEN 10-325 MG PO TABS: 1.0000 | ORAL_TABLET | Freq: Three times a day (TID) | ORAL | 0 refills | Status: DC | PRN

## 2018-01-14 NOTE — Telephone Encounter (Signed)
Eprescribed.

## 2018-01-24 ENCOUNTER — Other Ambulatory Visit: Payer: Self-pay | Admitting: Family Medicine

## 2018-01-28 NOTE — Progress Notes (Signed)
I reviewed health advisor's note, was available for consultation, and agree with documentation and plan.  

## 2018-02-14 ENCOUNTER — Other Ambulatory Visit: Payer: Self-pay

## 2018-02-14 MED ORDER — HYDROCODONE-ACETAMINOPHEN 10-325 MG PO TABS: 1.0000 | ORAL_TABLET | Freq: Three times a day (TID) | ORAL | 0 refills | Status: DC | PRN

## 2018-02-14 NOTE — Telephone Encounter (Signed)
Eprescribed.

## 2018-02-14 NOTE — Telephone Encounter (Signed)
Name of Medication: Hydrocodone apap 10-325 Name of Pharmacy: [facility] Last Fill or Written Date and Quantity: # 40 on 01/14/18 Last Office Visit and Type: 12/12/17 annual Next Office Visit and Type: 03/17/18 pain mgt 3 mth FU Last Controlled Substance Agreement Date: 06/10/17 Last UDS:06/05/17

## 2018-03-17 ENCOUNTER — Ambulatory Visit: Payer: Medicare HMO | Admitting: Family Medicine

## 2018-03-17 ENCOUNTER — Other Ambulatory Visit: Payer: Self-pay

## 2018-03-17 NOTE — Telephone Encounter (Signed)
Will you address in Dr. Synthia Innocent absence?  Name of Medication: Hydrocodone-APAP Name of Pharmacy: [place] or Written Date and Quantity: 02/14/18, #40/0 Last Office Visit and Type: 12/12/17, CPE Next Office Visit and Type: 03/19/18, 3 mo pain f/u Last Controlled Substance Agreement Date: 06/05/17 Last UDS: 06/05/17

## 2018-03-18 MED ORDER — HYDROCODONE-ACETAMINOPHEN 10-325 MG PO TABS: 1.0000 | ORAL_TABLET | Freq: Three times a day (TID) | ORAL | 0 refills | Status: DC | PRN

## 2018-03-18 NOTE — Telephone Encounter (Signed)
Sent. Thanks.   

## 2018-03-19 ENCOUNTER — Encounter: Payer: Self-pay | Admitting: Family Medicine

## 2018-03-19 ENCOUNTER — Ambulatory Visit (INDEPENDENT_AMBULATORY_CARE_PROVIDER_SITE_OTHER): Payer: Medicare HMO | Admitting: Family Medicine

## 2018-03-19 VITALS — BP 120/66 | HR 61 | Temp 98.6°F | Ht 74.5 in | Wt 263.1 lb

## 2018-03-19 DIAGNOSIS — G8929 Other chronic pain: Secondary | ICD-10-CM | POA: Diagnosis not present

## 2018-03-19 DIAGNOSIS — M545 Low back pain: Secondary | ICD-10-CM

## 2018-03-19 DIAGNOSIS — M542 Cervicalgia: Secondary | ICD-10-CM | POA: Diagnosis not present

## 2018-03-19 NOTE — Patient Instructions (Addendum)
You are doing well on medicines. Continue current regimen.  Return as needed or in 3 months for follow up visit.
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
## 2018-03-19 NOTE — Assessment & Plan Note (Addendum)
[place] CSRS reviewed.  Tolerating meds, feels effective - will continue. RTC 3 mo f/u chronic pain visit.

## 2018-03-25 ENCOUNTER — Other Ambulatory Visit: Payer: Self-pay | Admitting: Family Medicine

## 2018-03-25 NOTE — Telephone Encounter (Signed)
Will you address in Dr. Synthia Innocent absence?  Celebrex last rx:  12/30/17, #90 Gabapentin last rx:  12/30/17, #270 Last OV:  03/19/18, chronic pain f/u Next OV:  06/17/18, chronic pain f/u

## 2018-04-04 ENCOUNTER — Telehealth: Payer: Self-pay | Admitting: Family Medicine

## 2018-04-04 DIAGNOSIS — Z736 Limitation of activities due to disability: Secondary | ICD-10-CM

## 2018-04-04 NOTE — Telephone Encounter (Signed)
Placed form in Dr. G's box.  

## 2018-04-04 NOTE — Telephone Encounter (Signed)
Pt came by the office and dropped off a certificate of disability form that needs to be filled out by Dr. Darnell Level. Patient asked if you would call him when form is ready for pick up. Placed the form in RX tower for Dr. Darnell Level in the front office.

## 2018-04-15 ENCOUNTER — Other Ambulatory Visit: Payer: Self-pay

## 2018-04-15 DIAGNOSIS — Z0279 Encounter for issue of other medical certificate: Secondary | ICD-10-CM

## 2018-04-15 NOTE — Telephone Encounter (Signed)
Name of [place] apap 10-325  Name of [place] or Written Date and Quantity:# 40 on 03/18/18  Last Office Visit and Type:03/19/18 for  Pain mgt  Next Office Visit and Type:06/17/18 for 3 mth pain mgt  Last Controlled Substance Agreement Date:06/10/17  Last UDS:06/05/17

## 2018-04-16 ENCOUNTER — Encounter: Payer: Self-pay | Admitting: Family Medicine

## 2018-04-16 DIAGNOSIS — Z736 Limitation of activities due to disability: Secondary | ICD-10-CM | POA: Insufficient documentation

## 2018-04-16 MED ORDER — HYDROCODONE-ACETAMINOPHEN 10-325 MG PO TABS: 1.0000 | ORAL_TABLET | Freq: Three times a day (TID) | ORAL | 0 refills | Status: DC | PRN

## 2018-04-16 NOTE — Telephone Encounter (Signed)
Form filled out in Lisa's box.

## 2018-04-16 NOTE — Telephone Encounter (Signed)
Eprescribed.

## 2018-04-16 NOTE — Telephone Encounter (Addendum)
Spoke with pt notifying him the form is ready to pick up and there is a $5.00 fee to have it completed. Pt verbalizes understanding.   [Placed form at front office. Made copies to scan and for billing.]

## 2018-04-24 DIAGNOSIS — E669 Obesity, unspecified: Secondary | ICD-10-CM | POA: Diagnosis not present

## 2018-04-24 DIAGNOSIS — Z1211 Encounter for screening for malignant neoplasm of colon: Secondary | ICD-10-CM | POA: Diagnosis not present

## 2018-04-24 DIAGNOSIS — Z86010 Personal history of colonic polyps: Secondary | ICD-10-CM | POA: Diagnosis not present

## 2018-04-25 ENCOUNTER — Inpatient Hospital Stay: Admission: RE | Admit: 2018-04-25 | Payer: Medicare HMO | Source: Ambulatory Visit

## 2018-05-19 ENCOUNTER — Other Ambulatory Visit: Payer: Self-pay | Admitting: Family Medicine

## 2018-05-19 NOTE — Telephone Encounter (Signed)
Patient called to request a refill on his Hydrocodone 325 -.  Patient uses [facility] on [address].

## 2018-05-19 NOTE — Telephone Encounter (Signed)
Name of Medication: Hydrocodone-APAP Name of Pharmacy: [facility] or Written Date and Quantity: 04/16/18, #40 Last Office Visit and Type: 03/19/18, chronic pain f/u Next Office Visit and Type: 06/17/18, chronic pain f/u Last Controlled Substance Agreement Date: 06/05/17 Last UDS: 06/05/17

## 2018-05-20 MED ORDER — HYDROCODONE-ACETAMINOPHEN 10-325 MG PO TABS: 1.0000 | ORAL_TABLET | Freq: Three times a day (TID) | ORAL | 0 refills | Status: DC | PRN

## 2018-05-20 NOTE — Telephone Encounter (Signed)
Eprescribed.

## 2018-05-24 ENCOUNTER — Other Ambulatory Visit: Payer: Self-pay | Admitting: Family Medicine

## 2018-06-17 ENCOUNTER — Encounter: Payer: Self-pay | Admitting: Family Medicine

## 2018-06-17 ENCOUNTER — Ambulatory Visit (INDEPENDENT_AMBULATORY_CARE_PROVIDER_SITE_OTHER): Payer: Medicare HMO | Admitting: Family Medicine

## 2018-06-17 VITALS — Ht 74.5 in

## 2018-06-17 DIAGNOSIS — G8929 Other chronic pain: Secondary | ICD-10-CM

## 2018-06-17 DIAGNOSIS — M545 Low back pain, unspecified: Secondary | ICD-10-CM

## 2018-06-17 MED ORDER — HYDROCODONE-ACETAMINOPHEN 10-325 MG PO TABS: 1.0000 | ORAL_TABLET | Freq: Three times a day (TID) | ORAL | 0 refills | Status: DC | PRN

## 2018-06-17 MED ORDER — BISACODYL 5 MG PO TBEC: 5.0000 mg | DELAYED_RELEASE_TABLET | Freq: Every day | ORAL | 0 refills | Status: DC | PRN

## 2018-06-17 NOTE — Assessment & Plan Note (Addendum)
[place] CSRS reviewed. I will ask him to come in for UDS this week.  Tolerating med well, feels effective - continue at current dose. RTC 3 mo f/u visit.
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
## 2018-06-18 ENCOUNTER — Other Ambulatory Visit (INDEPENDENT_AMBULATORY_CARE_PROVIDER_SITE_OTHER): Payer: Medicare HMO

## 2018-06-18 DIAGNOSIS — M545 Low back pain: Secondary | ICD-10-CM | POA: Diagnosis not present

## 2018-06-18 DIAGNOSIS — G8929 Other chronic pain: Secondary | ICD-10-CM

## 2018-06-20 ENCOUNTER — Other Ambulatory Visit: Payer: Self-pay | Admitting: Family Medicine

## 2018-06-21 LAB — PAIN MGMT, PROFILE 8 W/CONF, U
6 Acetylmorphine: NEGATIVE ng/mL
Alcohol Metabolites: NEGATIVE ng/mL (ref <500)
Amphetamines: NEGATIVE ng/mL
Benzodiazepines: NEGATIVE ng/mL
Buprenorphine, Urine: NEGATIVE ng/mL
Cocaine Metabolite: NEGATIVE ng/mL
Codeine: NEGATIVE ng/mL
Creatinine: 150.9 mg/dL
Hydrocodone: 1258 ng/mL
Hydromorphone: 191 ng/mL
MDMA: NEGATIVE ng/mL
Marijuana Metabolite: NEGATIVE ng/mL
Morphine: NEGATIVE ng/mL
Norhydrocodone: 1142 ng/mL
Opiates: POSITIVE ng/mL
Oxidant: NEGATIVE ug/mL
Oxycodone: NEGATIVE ng/mL
pH: 5.3 (ref 4.5–9.0)

## 2018-06-23 ENCOUNTER — Other Ambulatory Visit: Payer: Self-pay | Admitting: Acute Care

## 2018-06-23 ENCOUNTER — Telehealth: Payer: Self-pay

## 2018-06-23 ENCOUNTER — Other Ambulatory Visit: Payer: Self-pay | Admitting: Family Medicine

## 2018-06-23 DIAGNOSIS — Z720 Tobacco use: Secondary | ICD-10-CM

## 2018-06-23 NOTE — Telephone Encounter (Signed)
Last office visit 06/17/2018 for chronic pain management.  Gabapentin last refilled 03/25/2018 for #270 with no refills.  Celebrex 03/25/2018 for #90 with no refills.   Next Appt: 09/24/2018 for 3 month pain management.

## 2018-06-23 NOTE — Telephone Encounter (Signed)
Left message for patient to call back to Dr. Synthia Innocent CMA

## 2018-06-24 NOTE — Telephone Encounter (Signed)
Patient called after 5pm last night and left a message with the on call service.  He is returning our call.

## 2018-06-24 NOTE — Telephone Encounter (Signed)
Spoke with pt relaying his lab results. [See labs, 06/18/18.]

## 2018-07-03 ENCOUNTER — Telehealth: Payer: Self-pay | Admitting: *Deleted

## 2018-07-03 NOTE — Telephone Encounter (Signed)

## 2018-07-04 ENCOUNTER — Other Ambulatory Visit: Payer: Self-pay

## 2018-07-04 ENCOUNTER — Ambulatory Visit (INDEPENDENT_AMBULATORY_CARE_PROVIDER_SITE_OTHER)
Admission: RE | Admit: 2018-07-04 | Discharge: 2018-07-04 | Disposition: A | Discharge status: Home / Self Care | Payer: Medicare HMO | Source: Ambulatory Visit | Attending: Acute Care | Admitting: Acute Care

## 2018-07-04 DIAGNOSIS — Z136 Encounter for screening for cardiovascular disorders: Secondary | ICD-10-CM | POA: Diagnosis not present

## 2018-07-04 DIAGNOSIS — Z720 Tobacco use: Secondary | ICD-10-CM

## 2018-07-04 DIAGNOSIS — Z87891 Personal history of nicotine dependence: Secondary | ICD-10-CM | POA: Diagnosis not present

## 2018-07-11 ENCOUNTER — Other Ambulatory Visit: Payer: Self-pay | Admitting: Acute Care

## 2018-07-11 DIAGNOSIS — Z122 Encounter for screening for malignant neoplasm of respiratory organs: Secondary | ICD-10-CM

## 2018-07-11 DIAGNOSIS — Z87891 Personal history of nicotine dependence: Secondary | ICD-10-CM

## 2018-07-18 ENCOUNTER — Telehealth: Payer: Self-pay | Admitting: Family Medicine

## 2018-07-18 MED ORDER — HYDROCODONE-ACETAMINOPHEN 10-325 MG PO TABS: 1.0000 | ORAL_TABLET | Freq: Three times a day (TID) | ORAL | 0 refills | Status: DC | PRN

## 2018-07-18 NOTE — Telephone Encounter (Signed)
Name of Medication: Hydrocodone apap 10-325 mg Name of [place] or Written Date and Quantity:# 40 on 06/17/18  Last Office Visit and Type: 06/17/18 pain mgt Next Office Visit and Type:09/24/18 for 3 mth pain mgt  Last Controlled Substance Agreement Date: 06/23/18 Last UDS:06/18/18  Shirlean Mylar noted pt has 1 pill left.

## 2018-07-18 NOTE — Telephone Encounter (Signed)
E prescribed. Thanks.

## 2018-07-18 NOTE — Telephone Encounter (Signed)
Best number [phone] Pt called to get a refill on hydrocodone pt has 1 pill left   [facility] [address] [facility]

## 2018-07-23 ENCOUNTER — Other Ambulatory Visit: Payer: Self-pay | Admitting: Family Medicine

## 2018-07-26 ENCOUNTER — Other Ambulatory Visit: Payer: Self-pay | Admitting: Family Medicine

## 2018-08-15 ENCOUNTER — Other Ambulatory Visit: Payer: Self-pay | Admitting: Family Medicine

## 2018-08-15 MED ORDER — HYDROCODONE-ACETAMINOPHEN 10-325 MG PO TABS: 1.0000 | ORAL_TABLET | Freq: Three times a day (TID) | ORAL | 0 refills | Status: DC | PRN

## 2018-08-15 NOTE — Telephone Encounter (Signed)
Name of Medication: hydrocodone apap 10-325 mg Name of Pharmacy:[facility] [address]  Last Fill or Written Date and Quantity: # 40 on 07/18/18 Last Office Visit and Type: 06/17/18 pain mgt Next Office Visit and Type: 09/24/18 pain mgt Last Controlled Substance Agreement Date: 06/23/18 Last UDS:06/18/18  Pt has 3 pills left.

## 2018-08-15 NOTE — Telephone Encounter (Signed)
Eprescribed.

## 2018-08-15 NOTE — Telephone Encounter (Signed)
Pt notified that hydrocodone apap was [facility] [address]; pt voiced understanding and appreciative.

## 2018-08-15 NOTE — Telephone Encounter (Signed)
Patient requested refill   HYDROcodone  3 tablets left   [place]   Patient's C/B # [phone]

## 2018-09-16 ENCOUNTER — Other Ambulatory Visit: Payer: Self-pay | Admitting: Family Medicine

## 2018-09-16 NOTE — Telephone Encounter (Signed)
Patient requesting refill   HYDROcodone  Patient has 2 tablets left  [place]

## 2018-09-16 NOTE — Telephone Encounter (Signed)
Name of Medication: Hydrocodone apap 10-325 mg Name of Pharmacy: [place] or Written Date and Quantity:# 40 on 08/15/18  Last Office Visit and Type: 06/17/18 pain mgt Next Office Visit and Type:09/24/18 pain mgt Last Controlled Substance Agreement Date:06/23/18  Last UDS:06/18/18

## 2018-09-17 MED ORDER — HYDROCODONE-ACETAMINOPHEN 10-325 MG PO TABS: 1.0000 | ORAL_TABLET | Freq: Three times a day (TID) | ORAL | 0 refills | Status: DC | PRN

## 2018-09-17 NOTE — Telephone Encounter (Signed)
Eprescribed.

## 2018-09-21 ENCOUNTER — Other Ambulatory Visit: Payer: Self-pay | Admitting: Family Medicine

## 2018-09-24 ENCOUNTER — Ambulatory Visit (INDEPENDENT_AMBULATORY_CARE_PROVIDER_SITE_OTHER): Payer: Medicare HMO | Admitting: Family Medicine

## 2018-09-24 ENCOUNTER — Encounter: Payer: Self-pay | Admitting: Family Medicine

## 2018-09-24 ENCOUNTER — Other Ambulatory Visit: Payer: Self-pay

## 2018-09-24 VITALS — BP 118/62 | HR 63 | Temp 97.6°F | Ht 74.5 in | Wt 258.3 lb

## 2018-09-24 DIAGNOSIS — M25512 Pain in left shoulder: Secondary | ICD-10-CM | POA: Diagnosis not present

## 2018-09-24 DIAGNOSIS — G8929 Other chronic pain: Secondary | ICD-10-CM

## 2018-09-24 DIAGNOSIS — M545 Low back pain: Secondary | ICD-10-CM | POA: Diagnosis not present

## 2018-09-24 NOTE — Assessment & Plan Note (Addendum)
Acute on chronic pain. Exam consistent with shoulder bursitis as well as likely RTC tendonitis. Will provide with shoulder bursitis exercises from [facility] pt advisor, advised return for steroid injection if not improving with treatment.

## 2018-09-24 NOTE — Patient Instructions (Addendum)
Return in 3 months for physical. Try shoulder exercises provided today - I think you have shoulder bursitis as well as rotator cuff tendonitis.  If no better with this, return for steroid shot into the shoulder.

## 2018-09-24 NOTE — Telephone Encounter (Signed)
Gabapentin last filled:  06/25/18, #270 Celebrex last filled:  06/26/18, #90 Last OV:  09/24/18, chronic pain f/u Next OV:  none

## 2018-09-24 NOTE — Assessment & Plan Note (Addendum)
[place] CSRS reviewed Reviewed updated UDS and controlled substance agreement from 06/2018.
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
## 2018-09-24 NOTE — Assessment & Plan Note (Signed)
Stable period on current regimen - continue.  

## 2018-10-16 DIAGNOSIS — R351 Nocturia: Secondary | ICD-10-CM | POA: Diagnosis not present

## 2018-10-16 DIAGNOSIS — R350 Frequency of micturition: Secondary | ICD-10-CM | POA: Diagnosis not present

## 2018-10-21 ENCOUNTER — Telehealth: Payer: Self-pay | Admitting: Family Medicine

## 2018-10-21 ENCOUNTER — Other Ambulatory Visit: Payer: Self-pay

## 2018-10-21 MED ORDER — HYDROCODONE-ACETAMINOPHEN 10-325 MG PO TABS: 1.0000 | ORAL_TABLET | Freq: Three times a day (TID) | ORAL | 0 refills | Status: DC | PRN

## 2018-10-21 NOTE — Telephone Encounter (Signed)
Eprescribed.

## 2018-10-21 NOTE — Telephone Encounter (Signed)
[facility] Night - Client TELEPHONE ADVICE RECORD [facility] Patient Name: Rodney Charles Gender: Male DOB: 1955/08/22 Age: 63 Y 9 M 12 D Return Phone Number: [phone] (Primary) Address: City/State/Zip: [place] [address] Client [facility] Night - Client Client Site [place] Physician Ria Bush - MD Contact Type Call Who Is Calling Patient / Member / Family / Caregiver Call Type Triage / Clinical Relationship To Patient Self Return Phone Number [phone] (Primary) Chief Complaint Neck Pain Reason for Call Symptomatic / Request for [place] states he needs a refill of Hydrocodone. Caller states he is experiencing neck and back pain. Translation No Nurse Assessment Nurse: Hardin Negus, RN, Mardene Celeste Date/Time Eilene Ghazi Time): 10/20/2018 6:24:13 PM Confirm and document reason for call. If symptomatic, describe symptoms. ---Caller states he needs a refill of Hydrocodone. Caller states he is experiencing neck and back pain. Has the patient had close contact with a person known or suspected to have the novel coronavirus illness OR traveled / lives in area with major community spread (including international travel) in the last 14 days from the onset of symptoms? * If Asymptomatic, screen for exposure and travel within the last 14 days. ---No Does the patient have any new or worsening symptoms? ---Yes Will a triage be completed? ---Yes Related visit to physician within the last 2 weeks? ---No Does the PT have any chronic conditions? (i.e. diabetes, asthma, this includes High risk factors for pregnancy, etc.) ---Yes List chronic conditions. ---chronic pain, Is this a behavioral health or substance abuse call? ---No Nurse: Hardin Negus, RN, Mardene Celeste Date/Time (Eastern Time): 10/20/2018 6:24:18 PM Please select the assessment type ---Refill Does the patient have  enough medication to last until the office opens? ---No PLEASE NOTE: All timestamps contained within this report are represented as [place] Standard Time. CONFIDENTIALTY NOTICE: This fax transmission is intended only for the addressee. It contains information that is legally privileged, confidential or otherwise protected from use or disclosure. If you are not the intended recipient, you are strictly prohibited from reviewing, disclosing, copying using or disseminating any of this information or taking any action in reliance on or regarding this information. If you have received this fax in error, please notify us immediately by telephone so that we can arrange for its return to [place]. Phone: [phone], Toll-Free: [phone], Fax: [phone] Page: 2 of 2 Call Id: [id number] Guidelines Guideline Title Affirmed Question Affirmed Notes Nurse Date/Time Eilene Ghazi Time) Back Pain [1] MODERATE back pain (e.g., interferes with normal activities) AND [2] present > 3 days Oren Bracket 10/20/2018 6:26:57 PM Disp. Time Eilene Ghazi Time) Disposition Final User 10/20/2018 6:01:21 PM Attempt made - message left Oren Bracket 10/20/2018 6:30:47 PM SEE PCP WITHIN 3 DAYS Yes Hardin Negus, RN, [place] Ponds Disagree/Comply Comply Caller Understands Yes PreDisposition Call Doctor Care Advice Given Per Guideline SEE PCP WITHIN 3 DAYS: CALL BACK IF: * Numbness or weakness occurs, or bowel/bladder problems * There are any urine symptoms or fever * You become worse. CARE ADVICE given per Back Pain (Adult) guideline. Referrals REFERRED TO PCP OFFICE

## 2018-10-21 NOTE — Telephone Encounter (Signed)
Name of Medication: Hydrocodone apap 10-325 mg Name of [place] or Written Date and Quantity: # 40 on 09/17/18 Last Office Visit and Type: 09/24/18 pain mgt Next Office Visit and Type:12/15/18 CPX  Last Controlled Substance Agreement Date: 06/23/18 Last UDS:06/18/18

## 2018-10-23 ENCOUNTER — Emergency Department (HOSPITAL_COMMUNITY): Payer: Medicare HMO

## 2018-10-23 ENCOUNTER — Emergency Department (HOSPITAL_COMMUNITY)
Admission: EM | Admit: 2018-10-23 | Discharge: 2018-10-23 | Disposition: A | Discharge status: Home / Self Care | Payer: Medicare HMO | Attending: Emergency Medicine | Admitting: Emergency Medicine

## 2018-10-23 ENCOUNTER — Encounter (HOSPITAL_COMMUNITY): Payer: Self-pay | Admitting: Emergency Medicine

## 2018-10-23 ENCOUNTER — Other Ambulatory Visit: Payer: Self-pay

## 2018-10-23 DIAGNOSIS — M545 Low back pain: Secondary | ICD-10-CM | POA: Diagnosis not present

## 2018-10-23 DIAGNOSIS — R4182 Altered mental status, unspecified: Secondary | ICD-10-CM | POA: Diagnosis not present

## 2018-10-23 DIAGNOSIS — M549 Dorsalgia, unspecified: Secondary | ICD-10-CM | POA: Insufficient documentation

## 2018-10-23 DIAGNOSIS — Z79899 Other long term (current) drug therapy: Secondary | ICD-10-CM | POA: Diagnosis not present

## 2018-10-23 DIAGNOSIS — Z87891 Personal history of nicotine dependence: Secondary | ICD-10-CM | POA: Diagnosis not present

## 2018-10-23 DIAGNOSIS — S3992XA Unspecified injury of lower back, initial encounter: Secondary | ICD-10-CM | POA: Diagnosis not present

## 2018-10-23 DIAGNOSIS — I2510 Atherosclerotic heart disease of native coronary artery without angina pectoris: Secondary | ICD-10-CM | POA: Insufficient documentation

## 2018-10-23 DIAGNOSIS — M542 Cervicalgia: Secondary | ICD-10-CM | POA: Insufficient documentation

## 2018-10-23 DIAGNOSIS — S299XXA Unspecified injury of thorax, initial encounter: Secondary | ICD-10-CM | POA: Diagnosis not present

## 2018-10-23 DIAGNOSIS — R52 Pain, unspecified: Secondary | ICD-10-CM | POA: Diagnosis not present

## 2018-10-23 DIAGNOSIS — M25512 Pain in left shoulder: Secondary | ICD-10-CM | POA: Diagnosis not present

## 2018-10-23 DIAGNOSIS — S199XXA Unspecified injury of neck, initial encounter: Secondary | ICD-10-CM | POA: Diagnosis not present

## 2018-10-23 DIAGNOSIS — S4992XA Unspecified injury of left shoulder and upper arm, initial encounter: Secondary | ICD-10-CM | POA: Diagnosis not present

## 2018-10-23 DIAGNOSIS — M5489 Other dorsalgia: Secondary | ICD-10-CM | POA: Diagnosis not present

## 2018-10-23 MED ORDER — NAPROXEN 500 MG PO TABS: 500.0000 mg | ORAL_TABLET | Freq: Two times a day (BID) | ORAL | 0 refills | Status: DC

## 2018-10-23 MED ORDER — OXYCODONE-ACETAMINOPHEN 5-325 MG PO TABS
1.0000 | ORAL_TABLET | Freq: Once | ORAL | Status: AC
  Administered 2018-10-23: 17:00:00 1 via ORAL
  Filled 2018-10-23: qty 1

## 2018-10-23 NOTE — ED Notes (Signed)
Patient transported to CT 
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
## 2018-10-27 ENCOUNTER — Other Ambulatory Visit: Payer: Self-pay

## 2018-10-27 ENCOUNTER — Encounter: Payer: Self-pay | Admitting: Family Medicine

## 2018-10-27 ENCOUNTER — Ambulatory Visit (INDEPENDENT_AMBULATORY_CARE_PROVIDER_SITE_OTHER): Payer: Medicare HMO | Admitting: Family Medicine

## 2018-10-27 VITALS — BP 126/78 | HR 80 | Temp 97.8°F | Ht 74.5 in | Wt 256.2 lb

## 2018-10-27 DIAGNOSIS — M791 Myalgia, unspecified site: Secondary | ICD-10-CM | POA: Diagnosis not present

## 2018-10-27 DIAGNOSIS — M542 Cervicalgia: Secondary | ICD-10-CM

## 2018-10-27 DIAGNOSIS — M25512 Pain in left shoulder: Secondary | ICD-10-CM | POA: Diagnosis not present

## 2018-10-27 DIAGNOSIS — Z23 Encounter for immunization: Secondary | ICD-10-CM | POA: Diagnosis not present

## 2018-10-27 DIAGNOSIS — G8929 Other chronic pain: Secondary | ICD-10-CM | POA: Diagnosis not present

## 2018-10-27 MED ORDER — METHOCARBAMOL 500 MG PO TABS: 500.0000 mg | ORAL_TABLET | Freq: Three times a day (TID) | ORAL | 3 refills | Status: DC | PRN

## 2018-10-27 MED ORDER — NAPROXEN 500 MG PO TABS: 500.0000 mg | ORAL_TABLET | Freq: Two times a day (BID) | ORAL | 0 refills | Status: DC

## 2018-10-27 NOTE — Assessment & Plan Note (Signed)
Diffuse muscular pain throughout neck, upper back L shoulder and down L leg. Reviewed reassuring imaging from ER. Add robaxin muscle relaxant (was not taking), continue naprosyn, hydrocodone PRN breakthrough pain, add ice/heat. Update if not improving as expected. Pt agrees with plan.
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
## 2018-10-27 NOTE — Patient Instructions (Addendum)
Flu shot today I have refilled robaxin muscle relaxant - take up to three times a day, but when at home and resting because it can cause sedation. Continue naprosyn. Use hydrocodone as needed.  Use ice and heating pad to painful areas.  Let me know if L shoulder not improving over the next 1-2 weeks to consider MRI of shoulder.

## 2018-10-31 ENCOUNTER — Telehealth: Payer: Self-pay | Admitting: Family Medicine

## 2018-10-31 NOTE — Telephone Encounter (Signed)
Patient stated that the medication he was prescribed for the [facility] and shoulder pain at his last visit. He stated that this medication is not working and he is still having pain in all those areas.  Patient would like to know if you want him to be seen by a specialist or if you want to try a different medication     C/B [phone]
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
## 2018-11-04 NOTE — Telephone Encounter (Signed)
Spoke with pt asking for update on pain despite taking meds.  Says pain is about the same.  Relayed Dr. Synthia Innocent message.  Pt verbalizes understanding.  States he wants to try a chiropractor first.  Fyi to Dr. Darnell Level.

## 2018-11-04 NOTE — Telephone Encounter (Signed)
Noted. Will await update.

## 2018-11-07 NOTE — Telephone Encounter (Signed)
Opened in error

## 2018-11-20 ENCOUNTER — Other Ambulatory Visit: Payer: Self-pay | Admitting: Family Medicine

## 2018-11-20 NOTE — Telephone Encounter (Signed)
Patient is requesting a refill  Hydrocodone  Patient has 1 tablet left   [name]

## 2018-11-20 NOTE — Telephone Encounter (Signed)
Name of Medication:hydrocodone apap 10-325  Name of Pharmacy: [facility] or Written Date and Quantity: # 40 on 10/21/18 Last Office Visit and Type: 10/27/18 acute & 09/24/18 pain mgt Next Office Visit and Type: 12/15/2018 CPX Last Controlled Substance Agreement Date: 06/23/18 Last UDS:06/18/18  Per Loma Sousa pt has 1 tab left.

## 2018-11-24 MED ORDER — HYDROCODONE-ACETAMINOPHEN 10-325 MG PO TABS: 1.0000 | ORAL_TABLET | Freq: Three times a day (TID) | ORAL | 0 refills | Status: DC | PRN

## 2018-11-24 NOTE — Telephone Encounter (Signed)
ERx 

## 2018-11-28 DIAGNOSIS — H524 Presbyopia: Secondary | ICD-10-CM | POA: Diagnosis not present

## 2018-12-05 DIAGNOSIS — M7582 Other shoulder lesions, left shoulder: Secondary | ICD-10-CM | POA: Diagnosis not present

## 2018-12-05 DIAGNOSIS — Z981 Arthrodesis status: Secondary | ICD-10-CM | POA: Diagnosis not present

## 2018-12-05 DIAGNOSIS — M5416 Radiculopathy, lumbar region: Secondary | ICD-10-CM | POA: Diagnosis not present

## 2018-12-05 DIAGNOSIS — M1612 Unilateral primary osteoarthritis, left hip: Secondary | ICD-10-CM | POA: Diagnosis not present

## 2018-12-07 ENCOUNTER — Other Ambulatory Visit: Payer: Self-pay | Admitting: Family Medicine

## 2018-12-07 DIAGNOSIS — M109 Gout, unspecified: Secondary | ICD-10-CM

## 2018-12-07 DIAGNOSIS — E785 Hyperlipidemia, unspecified: Secondary | ICD-10-CM

## 2018-12-07 DIAGNOSIS — Z125 Encounter for screening for malignant neoplasm of prostate: Secondary | ICD-10-CM

## 2018-12-08 ENCOUNTER — Ambulatory Visit: Payer: Medicare HMO

## 2018-12-08 ENCOUNTER — Other Ambulatory Visit: Payer: Self-pay

## 2018-12-08 ENCOUNTER — Other Ambulatory Visit (INDEPENDENT_AMBULATORY_CARE_PROVIDER_SITE_OTHER): Payer: Medicare HMO

## 2018-12-08 DIAGNOSIS — M109 Gout, unspecified: Secondary | ICD-10-CM

## 2018-12-08 DIAGNOSIS — E785 Hyperlipidemia, unspecified: Secondary | ICD-10-CM

## 2018-12-08 DIAGNOSIS — Z125 Encounter for screening for malignant neoplasm of prostate: Secondary | ICD-10-CM

## 2018-12-08 LAB — LIPID PANEL
Cholesterol: 198 mg/dL (ref 0–200)
HDL: 62.8 mg/dL (ref >39.00)
LDL Cholesterol: 119 mg/dL — ABNORMAL HIGH (ref 0–99)
NonHDL: 134.8
Total CHOL/HDL Ratio: 3
Triglycerides: 77 mg/dL (ref 0.0–149.0)
VLDL: 15.4 mg/dL (ref 0.0–40.0)

## 2018-12-08 LAB — COMPREHENSIVE METABOLIC PANEL
ALT: 13 U/L (ref 0–53)
AST: 14 U/L (ref 0–37)
Albumin: 4.3 g/dL (ref 3.5–5.2)
Alkaline Phosphatase: 57 U/L (ref 39–117)
BUN: 12 mg/dL (ref 6–23)
CO2: 28 mEq/L (ref 19–32)
Calcium: 9.2 mg/dL (ref 8.4–10.5)
Chloride: 102 mEq/L (ref 96–112)
Creatinine, Ser: 1.17 mg/dL (ref 0.40–1.50)
GFR: 76.71 mL/min (ref >60.00)
Glucose, Bld: 118 mg/dL — ABNORMAL HIGH (ref 70–99)
Potassium: 3.5 mEq/L (ref 3.5–5.1)
Sodium: 138 mEq/L (ref 135–145)
Total Bilirubin: 0.8 mg/dL (ref 0.2–1.2)
Total Protein: 6.8 g/dL (ref 6.0–8.3)

## 2018-12-08 LAB — URIC ACID: Uric Acid, Serum: 6.8 mg/dL (ref 4.0–7.8)

## 2018-12-08 LAB — PSA, MEDICARE: PSA: 0.5 ng/ml (ref 0.10–4.00)

## 2018-12-09 ENCOUNTER — Ambulatory Visit (INDEPENDENT_AMBULATORY_CARE_PROVIDER_SITE_OTHER): Payer: Medicare HMO

## 2018-12-09 DIAGNOSIS — Z0000 Encounter for general adult medical examination without abnormal findings: Secondary | ICD-10-CM | POA: Diagnosis not present

## 2018-12-09 NOTE — Patient Instructions (Signed)
Mr. Rodney Charles , Thank you for taking time to come for your Medicare Wellness Visit. I appreciate your ongoing commitment to your health goals. Please review the following plan we discussed and let me know if I can assist you in the future.   Screening recommendations/referrals: Colonoscopy: up to date, completed 04/07/2014 Recommended yearly ophthalmology/optometry visit for glaucoma screening and checkup Recommended yearly dental visit for hygiene and checkup  Vaccinations: Influenza vaccine: up to date, completed 10/27/2018 Pneumococcal vaccine: @ age 73 Tdap vaccine: up to date, completed 09/09/2012 Shingles vaccine: will think about this    Advanced directives: Please bring a copy of your POA (Power of [place]) and/or Living Will to your next appointment.   Conditions/risks identified: hyperlipidemia  Next appointment: 12/15/2018 @ 8:30 am   Preventive Care 40-64 Years, Male Preventive care refers to lifestyle choices and visits with your health care provider that can promote health and wellness. What does preventive care include?  A yearly physical exam. This is also called an annual well check.  Dental exams once or twice a year.  Routine eye exams. Ask your health care provider how often you should have your eyes checked.  Personal lifestyle choices, including:  Daily care of your teeth and gums.  Regular physical activity.  Eating a healthy diet.  Avoiding tobacco and drug use.  Limiting alcohol use.  Practicing safe sex.  Taking low-dose aspirin every day starting at age 61. What happens during an annual well check? The services and screenings done by your health care provider during your annual well check will depend on your age, overall health, lifestyle risk factors, and family history of disease. Counseling  Your health care provider may ask you questions about your:  Alcohol use.  Tobacco use.  Drug use.  Emotional well-being.  Home and relationship  well-being.  Sexual activity.  Eating habits.  Work and work Statistician. Screening  You may have the following tests or measurements:  Height, weight, and BMI.  Blood pressure.  Lipid and cholesterol levels. These may be checked every 5 years, or more frequently if you are over 25 years old.  Skin check.  Lung cancer screening. You may have this screening every year starting at age 35 if you have a 30-pack-year history of smoking and currently smoke or have quit within the past 15 years.  Fecal occult blood test (FOBT) of the stool. You may have this test every year starting at age 54.  Flexible sigmoidoscopy or colonoscopy. You may have a sigmoidoscopy every 5 years or a colonoscopy every 10 years starting at age 22.  Prostate cancer screening. Recommendations will vary depending on your family history and other risks.  Hepatitis C blood test.  Hepatitis B blood test.  Sexually transmitted disease (STD) testing.  Diabetes screening. This is done by checking your blood sugar (glucose) after you have not eaten for a while (fasting). You may have this done every 1-3 years. Discuss your test results, treatment options, and if necessary, the need for more tests with your health care provider. Vaccines  Your health care provider may recommend certain vaccines, such as:  Influenza vaccine. This is recommended every year.  Tetanus, diphtheria, and acellular pertussis (Tdap, Td) vaccine. You may need a Td booster every 10 years.  Zoster vaccine. You may need this after age 25.  Pneumococcal 13-valent conjugate (PCV13) vaccine. You may need this if you have certain conditions and have not been vaccinated.  Pneumococcal polysaccharide (PPSV23) vaccine. You may need one  or two doses if you smoke cigarettes or if you have certain conditions. Talk to your health care provider about which screenings and vaccines you need and how often you need them. This information is not intended  to replace advice given to you by your health care provider. Make sure you discuss any questions you have with your health care provider. Document Released: 02/18/2015 Document Revised: 10/12/2015 Document Reviewed: 11/23/2014 Elsevier Interactive Patient Education  2017 [place] Prevention in the Home Falls can cause injuries. They can happen to people of all ages. There are many things you can do to make your home safe and to help prevent falls. What can I do on the outside of my home?  Regularly fix the edges of walkways and driveways and fix any cracks.  Remove anything that might make you trip as you walk through a door, such as a raised step or threshold.  Trim any bushes or trees on the path to your home.  Use bright outdoor lighting.  Clear any walking paths of anything that might make someone trip, such as rocks or tools.  Regularly check to see if handrails are loose or broken. Make sure that both sides of any steps have handrails.  Any raised decks and porches should have guardrails on the edges.  Have any leaves, snow, or ice cleared regularly.  Use sand or salt on walking paths during winter.  Clean up any spills in your garage right away. This includes oil or grease spills. What can I do in the bathroom?  Use night lights.  Install grab bars by the toilet and in the tub and shower. Do not use towel bars as grab bars.  Use non-skid mats or decals in the tub or shower.  If you need to sit down in the shower, use a plastic, non-slip stool.  Keep the floor dry. Clean up any water that spills on the floor as soon as it happens.  Remove soap buildup in the tub or shower regularly.  Attach bath mats securely with double-sided non-slip rug tape.  Do not have throw rugs and other things on the floor that can make you trip. What can I do in the bedroom?  Use night lights.  Make sure that you have a light by your bed that is easy to reach.  Do not use  any sheets or blankets that are too big for your bed. They should not hang down onto the floor.  Have a firm chair that has side arms. You can use this for support while you get dressed.  Do not have throw rugs and other things on the floor that can make you trip. What can I do in the kitchen?  Clean up any spills right away.  Avoid walking on wet floors.  Keep items that you use a lot in easy-to-reach places.  If you need to reach something above you, use a strong step stool that has a grab bar.  Keep electrical cords out of the way.  Do not use floor polish or wax that makes floors slippery. If you must use wax, use non-skid floor wax.  Do not have throw rugs and other things on the floor that can make you trip. What can I do with my stairs?  Do not leave any items on the stairs.  Make sure that there are handrails on both sides of the stairs and use them. Fix handrails that are broken or loose. Make sure that handrails  are as long as the stairways.  Check any carpeting to make sure that it is firmly attached to the stairs. Fix any carpet that is loose or worn.  Avoid having throw rugs at the top or bottom of the stairs. If you do have throw rugs, attach them to the floor with carpet tape.  Make sure that you have a light switch at the top of the stairs and the bottom of the stairs. If you do not have them, ask someone to add them for you. What else can I do to help prevent falls?  Wear shoes that:  Do not have high heels.  Have rubber bottoms.  Are comfortable and fit you well.  Are closed at the toe. Do not wear sandals.  If you use a stepladder:  Make sure that it is fully opened. Do not climb a closed stepladder.  Make sure that both sides of the stepladder are locked into place.  Ask someone to hold it for you, if possible.  Clearly mark and make sure that you can see:  Any grab bars or handrails.  First and last steps.  Where the edge of each step is.   Use tools that help you move around (mobility aids) if they are needed. These include:  Canes.  Walkers.  Scooters.  Crutches.  Turn on the lights when you go into a dark area. Replace any light bulbs as soon as they burn out.  Set up your furniture so you have a clear path. Avoid moving your furniture around.  If any of your floors are uneven, fix them.  If there are any pets around you, be aware of where they are.  Review your medicines with your doctor. Some medicines can make you feel dizzy. This can increase your chance of falling. Ask your doctor what other things that you can do to help prevent falls. This information is not intended to replace advice given to you by your health care provider. Make sure you discuss any questions you have with your health care provider. Document Released: 11/18/2008 Document Revised: 06/30/2015 Document Reviewed: 02/26/2014 Elsevier Interactive Patient Education  2017 [employer].

## 2018-12-09 NOTE — Progress Notes (Signed)
PCP notes:  Health Maintenance: Patient wants to think about Shingrix vaccine.    Abnormal Screenings: None    Patient concerns: None   Nurse concerns: None   Next PCP appt.: 12/15/2018 @ 8:30 am
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
## 2018-12-12 DIAGNOSIS — M7582 Other shoulder lesions, left shoulder: Secondary | ICD-10-CM | POA: Diagnosis not present

## 2018-12-12 DIAGNOSIS — M5416 Radiculopathy, lumbar region: Secondary | ICD-10-CM | POA: Diagnosis not present

## 2018-12-12 DIAGNOSIS — M542 Cervicalgia: Secondary | ICD-10-CM | POA: Diagnosis not present

## 2018-12-15 ENCOUNTER — Ambulatory Visit (INDEPENDENT_AMBULATORY_CARE_PROVIDER_SITE_OTHER): Payer: Medicare HMO | Admitting: Family Medicine

## 2018-12-15 ENCOUNTER — Other Ambulatory Visit: Payer: Self-pay

## 2018-12-15 ENCOUNTER — Encounter: Payer: Self-pay | Admitting: Family Medicine

## 2018-12-15 VITALS — BP 126/72 | HR 70 | Temp 97.9°F | Ht 73.5 in | Wt 251.2 lb

## 2018-12-15 DIAGNOSIS — Z7189 Other specified counseling: Secondary | ICD-10-CM | POA: Diagnosis not present

## 2018-12-15 DIAGNOSIS — M545 Low back pain, unspecified: Secondary | ICD-10-CM

## 2018-12-15 DIAGNOSIS — J439 Emphysema, unspecified: Secondary | ICD-10-CM | POA: Diagnosis not present

## 2018-12-15 DIAGNOSIS — M542 Cervicalgia: Secondary | ICD-10-CM | POA: Diagnosis not present

## 2018-12-15 DIAGNOSIS — Z0000 Encounter for general adult medical examination without abnormal findings: Secondary | ICD-10-CM | POA: Diagnosis not present

## 2018-12-15 DIAGNOSIS — F39 Unspecified mood [affective] disorder: Secondary | ICD-10-CM

## 2018-12-15 DIAGNOSIS — I700 Atherosclerosis of aorta: Secondary | ICD-10-CM

## 2018-12-15 DIAGNOSIS — G8929 Other chronic pain: Secondary | ICD-10-CM | POA: Diagnosis not present

## 2018-12-15 DIAGNOSIS — E66811 Obesity, class 1: Secondary | ICD-10-CM

## 2018-12-15 DIAGNOSIS — Z736 Limitation of activities due to disability: Secondary | ICD-10-CM

## 2018-12-15 DIAGNOSIS — S46002D Unspecified injury of muscle(s) and tendon(s) of the rotator cuff of left shoulder, subsequent encounter: Secondary | ICD-10-CM

## 2018-12-15 DIAGNOSIS — M109 Gout, unspecified: Secondary | ICD-10-CM | POA: Diagnosis not present

## 2018-12-15 DIAGNOSIS — I2510 Atherosclerotic heart disease of native coronary artery without angina pectoris: Secondary | ICD-10-CM | POA: Diagnosis not present

## 2018-12-15 DIAGNOSIS — E785 Hyperlipidemia, unspecified: Secondary | ICD-10-CM | POA: Diagnosis not present

## 2018-12-15 DIAGNOSIS — E669 Obesity, unspecified: Secondary | ICD-10-CM
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
## 2018-12-15 NOTE — Assessment & Plan Note (Signed)
No recent flare.  

## 2018-12-15 NOTE — Patient Instructions (Addendum)
EKG today Try CoEnzyme Q10 supplement 50-100mg  daily. This is over the counter. If interested, check with pharmacy about new 2 shot shingles series (shingrix).  You are doing well today Return in 3 months for follow up visit.   Health Maintenance, Male Adopting a healthy lifestyle and getting preventive care are important in promoting health and wellness. Ask your health care provider about:  The right schedule for you to have regular tests and exams.  Things you can do on your own to prevent diseases and keep yourself healthy. What should I know about diet, weight, and exercise? Eat a healthy diet   Eat a diet that includes plenty of vegetables, fruits, low-fat dairy products, and lean protein.  Do not eat a lot of foods that are high in solid fats, added sugars, or sodium. Maintain a healthy weight Body mass index (BMI) is a measurement that can be used to identify possible weight problems. It estimates body fat based on height and weight. Your health care provider can help determine your BMI and help you achieve or maintain a healthy weight. Get regular exercise Get regular exercise. This is one of the most important things you can do for your health. Most adults should:  Exercise for at least 150 minutes each week. The exercise should increase your heart rate and make you sweat (moderate-intensity exercise).  Do strengthening exercises at least twice a week. This is in addition to the moderate-intensity exercise.  Spend less time sitting. Even light physical activity can be beneficial. Watch cholesterol and blood lipids Have your blood tested for lipids and cholesterol at 61 years of age, then have this test every 5 years. You may need to have your cholesterol levels checked more often if:  Your lipid or cholesterol levels are high.  You are older than 61 years of age.  You are at high risk for heart disease. What should I know about cancer screening? Many types of cancers  can be detected early and may often be prevented. Depending on your health history and family history, you may need to have cancer screening at various ages. This may include screening for:  Colorectal cancer.  Prostate cancer.  Skin cancer.  Lung cancer. What should I know about heart disease, diabetes, and high blood pressure? Blood pressure and heart disease  High blood pressure causes heart disease and increases the risk of stroke. This is more likely to develop in people who have high blood pressure readings, are of African descent, or are overweight.  Talk with your health care provider about your target blood pressure readings.  Have your blood pressure checked: ? Every 3-5 years if you are 53-40 years of age. ? Every year if you are 23 years old or older.  If you are between the ages of 49 and 30 and are a current or former smoker, ask your health care provider if you should have a one-time screening for abdominal aortic aneurysm (AAA). Diabetes Have regular diabetes screenings. This checks your fasting blood sugar level. Have the screening done:  Once every three years after age 81 if you are at a normal weight and have a low risk for diabetes.  More often and at a younger age if you are overweight or have a high risk for diabetes. What should I know about preventing infection? Hepatitis B If you have a higher risk for hepatitis B, you should be screened for this virus. Talk with your health care provider to find out if you  are at risk for hepatitis B infection. Hepatitis C Blood testing is recommended for:  Everyone born from [birth year] through 1963.  Anyone with known risk factors for hepatitis C. Sexually transmitted infections (STIs)  You should be screened each year for STIs, including gonorrhea and chlamydia, if: ? You are sexually active and are younger than 61 years of age. ? You are older than 61 years of age and your health care provider tells you that you are at  risk for this type of infection. ? Your sexual activity has changed since you were last screened, and you are at increased risk for chlamydia or gonorrhea. Ask your health care provider if you are at risk.  Ask your health care provider about whether you are at high risk for HIV. Your health care provider may recommend a prescription medicine to help prevent HIV infection. If you choose to take medicine to prevent HIV, you should first get tested for HIV. You should then be tested every 3 months for as long as you are taking the medicine. Follow these instructions at home: Lifestyle  Do not use any products that contain nicotine or tobacco, such as cigarettes, e-cigarettes, and chewing tobacco. If you need help quitting, ask your health care provider.  Do not use street drugs.  Do not share needles.  Ask your health care provider for help if you need support or information about quitting drugs. Alcohol use  Do not drink alcohol if your health care provider tells you not to drink.  If you drink alcohol: ? Limit how much you have to 0-2 drinks a day. ? Be aware of how much alcohol is in your drink. In the [place] one drink equals one 12 oz bottle of beer (355 mL), one 5 oz glass of wine (148 mL), or one 1 oz glass of hard liquor (44 mL). General instructions  Schedule regular health, dental, and eye exams.  Stay current with your vaccines.  Tell your health care provider if: ? You often feel depressed. ? You have ever been abused or do not feel safe at home. Summary  Adopting a healthy lifestyle and getting preventive care are important in promoting health and wellness.  Follow your health care provider's instructions about healthy diet, exercising, and getting tested or screened for diseases.  Follow your health care provider's instructions on monitoring your cholesterol and blood pressure. This information is not intended to replace advice given to you by your health care  provider. Make sure you discuss any questions you have with your health care provider. Document Released: 07/21/2007 Document Revised: 01/15/2018 Document Reviewed: 01/15/2018 Elsevier Patient Education  2020 [employer].

## 2018-12-15 NOTE — Assessment & Plan Note (Signed)
Weight loss noted  
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
## 2018-12-15 NOTE — Assessment & Plan Note (Signed)
Chronic, stable on crestor QOD dose.  The 10-year ASCVD risk score Mikey Bussing DC Brooke Bonito., et al., 2013) is: 7.4%   Values used to calculate the score:     Age: 63 years     Sex: Male     Is Non-Hispanic African American: Yes     Diabetic: No     Tobacco smoker: No     Systolic Blood Pressure: 123XX123 mmHg     Is BP treated: No     HDL Cholesterol: 62.8 mg/dL     Total Cholesterol: 198 mg/dL

## 2018-12-15 NOTE — Assessment & Plan Note (Signed)
Continue crestor 

## 2018-12-15 NOTE — Assessment & Plan Note (Addendum)
Appreciate ortho care - referred to PT s/p CT scan. Will await records to review.

## 2018-12-15 NOTE — Assessment & Plan Note (Signed)
Stable period on sertraline and pamelor.

## 2018-12-15 NOTE — Assessment & Plan Note (Signed)
Advanced directives: does not have set up. Sister Levada Dy would be HCPOA. Has this at home - will bring [place] copy.

## 2018-12-15 NOTE — Assessment & Plan Note (Signed)
asxs off respiratory medication 

## 2018-12-15 NOTE — Assessment & Plan Note (Signed)
[place] CSRS reviewed  ?

## 2018-12-15 NOTE — Assessment & Plan Note (Signed)
Preventative protocols reviewed and updated unless pt declined. Discussed healthy diet and lifestyle.  

## 2018-12-16 DIAGNOSIS — M5416 Radiculopathy, lumbar region: Secondary | ICD-10-CM | POA: Diagnosis not present

## 2018-12-16 DIAGNOSIS — M542 Cervicalgia: Secondary | ICD-10-CM | POA: Diagnosis not present

## 2018-12-16 DIAGNOSIS — M7582 Other shoulder lesions, left shoulder: Secondary | ICD-10-CM | POA: Diagnosis not present

## 2018-12-16 DIAGNOSIS — Z981 Arthrodesis status: Secondary | ICD-10-CM | POA: Diagnosis not present

## 2018-12-16 DIAGNOSIS — M1612 Unilateral primary osteoarthritis, left hip: Secondary | ICD-10-CM | POA: Diagnosis not present

## 2018-12-19 DIAGNOSIS — M7582 Other shoulder lesions, left shoulder: Secondary | ICD-10-CM | POA: Diagnosis not present

## 2018-12-19 DIAGNOSIS — M542 Cervicalgia: Secondary | ICD-10-CM | POA: Diagnosis not present

## 2018-12-19 DIAGNOSIS — Z981 Arthrodesis status: Secondary | ICD-10-CM | POA: Diagnosis not present

## 2018-12-19 DIAGNOSIS — M5416 Radiculopathy, lumbar region: Secondary | ICD-10-CM | POA: Diagnosis not present

## 2018-12-19 DIAGNOSIS — M1612 Unilateral primary osteoarthritis, left hip: Secondary | ICD-10-CM | POA: Diagnosis not present

## 2018-12-22 DIAGNOSIS — M5416 Radiculopathy, lumbar region: Secondary | ICD-10-CM | POA: Diagnosis not present

## 2018-12-22 DIAGNOSIS — M1612 Unilateral primary osteoarthritis, left hip: Secondary | ICD-10-CM | POA: Diagnosis not present

## 2018-12-22 DIAGNOSIS — M542 Cervicalgia: Secondary | ICD-10-CM | POA: Diagnosis not present

## 2018-12-22 DIAGNOSIS — M7582 Other shoulder lesions, left shoulder: Secondary | ICD-10-CM | POA: Diagnosis not present

## 2018-12-22 DIAGNOSIS — Z981 Arthrodesis status: Secondary | ICD-10-CM | POA: Diagnosis not present

## 2018-12-23 DIAGNOSIS — M7582 Other shoulder lesions, left shoulder: Secondary | ICD-10-CM | POA: Diagnosis not present

## 2018-12-23 DIAGNOSIS — M533 Sacrococcygeal disorders, not elsewhere classified: Secondary | ICD-10-CM | POA: Diagnosis not present

## 2018-12-23 DIAGNOSIS — M542 Cervicalgia: Secondary | ICD-10-CM | POA: Diagnosis not present

## 2018-12-23 DIAGNOSIS — M1612 Unilateral primary osteoarthritis, left hip: Secondary | ICD-10-CM | POA: Diagnosis not present

## 2018-12-23 DIAGNOSIS — G8929 Other chronic pain: Secondary | ICD-10-CM | POA: Diagnosis not present

## 2018-12-23 DIAGNOSIS — M5442 Lumbago with sciatica, left side: Secondary | ICD-10-CM | POA: Diagnosis not present

## 2018-12-23 DIAGNOSIS — M5441 Lumbago with sciatica, right side: Secondary | ICD-10-CM | POA: Diagnosis not present

## 2018-12-23 DIAGNOSIS — M7918 Myalgia, other site: Secondary | ICD-10-CM | POA: Diagnosis not present

## 2018-12-23 DIAGNOSIS — Z981 Arthrodesis status: Secondary | ICD-10-CM | POA: Diagnosis not present

## 2018-12-23 DIAGNOSIS — M5416 Radiculopathy, lumbar region: Secondary | ICD-10-CM | POA: Diagnosis not present

## 2018-12-23 DIAGNOSIS — M5481 Occipital neuralgia: Secondary | ICD-10-CM | POA: Diagnosis not present

## 2018-12-24 ENCOUNTER — Other Ambulatory Visit: Payer: Self-pay

## 2018-12-24 ENCOUNTER — Other Ambulatory Visit: Payer: Self-pay | Admitting: Family Medicine

## 2018-12-24 MED ORDER — HYDROCODONE-ACETAMINOPHEN 10-325 MG PO TABS: 1.0000 | ORAL_TABLET | Freq: Three times a day (TID) | ORAL | 0 refills | Status: DC | PRN

## 2018-12-24 MED ORDER — FLUTICASONE PROPIONATE 50 MCG/ACT NA SUSP: NASAL | 5 refills | Status: DC

## 2018-12-24 NOTE — Telephone Encounter (Signed)
Name of Medication: Hydrocodone apap 10-325 Name of Pharmacy: [facility] or Written Date and Quantity:#40 on 11/24/18  Last Office Visit and Type:12/25/2018 annual  Next Office Visit and Type: none scheduled Last Controlled Substance Agreement Date: 06/23/18 Last UDS:06/18/18  Please see Carrie's note in this phone message.

## 2018-12-24 NOTE — Telephone Encounter (Signed)
Patient called to get a refill on Hydrocodone.  Patient has 1 pill left.  [facility]. Patient has been taking about 3 extra pills for physical therapy 3 days a week.

## 2018-12-24 NOTE — Telephone Encounter (Signed)
ERx 

## 2018-12-26 ENCOUNTER — Other Ambulatory Visit: Payer: Self-pay

## 2018-12-26 DIAGNOSIS — M5416 Radiculopathy, lumbar region: Secondary | ICD-10-CM | POA: Diagnosis not present

## 2018-12-26 DIAGNOSIS — M542 Cervicalgia: Secondary | ICD-10-CM | POA: Diagnosis not present

## 2018-12-26 DIAGNOSIS — Z981 Arthrodesis status: Secondary | ICD-10-CM | POA: Diagnosis not present

## 2018-12-26 DIAGNOSIS — M1612 Unilateral primary osteoarthritis, left hip: Secondary | ICD-10-CM | POA: Diagnosis not present

## 2018-12-26 DIAGNOSIS — M7582 Other shoulder lesions, left shoulder: Secondary | ICD-10-CM | POA: Diagnosis not present

## 2018-12-26 NOTE — Telephone Encounter (Signed)
Celebrex last rx:  09/24/18, #90/0 Colcrys last rx:  05/27/18, #30/3 Gabapentin last rx:  09/24/18, #270/0 Last OV: 12/15/18, CPE prt 2 Next OV:  none

## 2018-12-29 DIAGNOSIS — Z981 Arthrodesis status: Secondary | ICD-10-CM | POA: Diagnosis not present

## 2018-12-29 DIAGNOSIS — M542 Cervicalgia: Secondary | ICD-10-CM | POA: Diagnosis not present

## 2018-12-29 DIAGNOSIS — M5416 Radiculopathy, lumbar region: Secondary | ICD-10-CM | POA: Diagnosis not present

## 2018-12-29 DIAGNOSIS — M1612 Unilateral primary osteoarthritis, left hip: Secondary | ICD-10-CM | POA: Diagnosis not present

## 2018-12-29 DIAGNOSIS — M7582 Other shoulder lesions, left shoulder: Secondary | ICD-10-CM | POA: Diagnosis not present

## 2018-12-30 DIAGNOSIS — M542 Cervicalgia: Secondary | ICD-10-CM | POA: Diagnosis not present

## 2018-12-30 DIAGNOSIS — M1612 Unilateral primary osteoarthritis, left hip: Secondary | ICD-10-CM | POA: Diagnosis not present

## 2018-12-30 DIAGNOSIS — M7582 Other shoulder lesions, left shoulder: Secondary | ICD-10-CM | POA: Diagnosis not present

## 2018-12-30 DIAGNOSIS — M5416 Radiculopathy, lumbar region: Secondary | ICD-10-CM | POA: Diagnosis not present

## 2018-12-30 DIAGNOSIS — Z981 Arthrodesis status: Secondary | ICD-10-CM | POA: Diagnosis not present

## 2018-12-31 MED ORDER — GABAPENTIN 300 MG PO CAPS: ORAL_CAPSULE | ORAL | 1 refills | Status: DC

## 2018-12-31 MED ORDER — CELECOXIB 100 MG PO CAPS: ORAL_CAPSULE | ORAL | 1 refills | Status: DC

## 2018-12-31 MED ORDER — COLCHICINE 0.6 MG PO TABS: ORAL_TABLET | ORAL | 3 refills | Status: DC

## 2019-01-05 DIAGNOSIS — Z981 Arthrodesis status: Secondary | ICD-10-CM | POA: Diagnosis not present

## 2019-01-05 DIAGNOSIS — M1612 Unilateral primary osteoarthritis, left hip: Secondary | ICD-10-CM | POA: Diagnosis not present

## 2019-01-05 DIAGNOSIS — M542 Cervicalgia: Secondary | ICD-10-CM | POA: Diagnosis not present

## 2019-01-05 DIAGNOSIS — M5416 Radiculopathy, lumbar region: Secondary | ICD-10-CM | POA: Diagnosis not present

## 2019-01-05 DIAGNOSIS — M7582 Other shoulder lesions, left shoulder: Secondary | ICD-10-CM | POA: Diagnosis not present

## 2019-01-06 DIAGNOSIS — M5441 Lumbago with sciatica, right side: Secondary | ICD-10-CM | POA: Diagnosis not present

## 2019-01-06 DIAGNOSIS — M7582 Other shoulder lesions, left shoulder: Secondary | ICD-10-CM | POA: Diagnosis not present

## 2019-01-06 DIAGNOSIS — M5442 Lumbago with sciatica, left side: Secondary | ICD-10-CM | POA: Diagnosis not present

## 2019-01-06 DIAGNOSIS — Z981 Arthrodesis status: Secondary | ICD-10-CM | POA: Diagnosis not present

## 2019-01-06 DIAGNOSIS — M542 Cervicalgia: Secondary | ICD-10-CM | POA: Diagnosis not present

## 2019-01-06 DIAGNOSIS — M533 Sacrococcygeal disorders, not elsewhere classified: Secondary | ICD-10-CM | POA: Diagnosis not present

## 2019-01-06 DIAGNOSIS — G8929 Other chronic pain: Secondary | ICD-10-CM | POA: Diagnosis not present

## 2019-01-06 DIAGNOSIS — M1612 Unilateral primary osteoarthritis, left hip: Secondary | ICD-10-CM | POA: Diagnosis not present

## 2019-01-06 DIAGNOSIS — M7918 Myalgia, other site: Secondary | ICD-10-CM | POA: Diagnosis not present

## 2019-01-06 DIAGNOSIS — M5416 Radiculopathy, lumbar region: Secondary | ICD-10-CM | POA: Diagnosis not present

## 2019-01-06 DIAGNOSIS — M5481 Occipital neuralgia: Secondary | ICD-10-CM | POA: Diagnosis not present

## 2019-01-19 ENCOUNTER — Other Ambulatory Visit: Payer: Self-pay

## 2019-01-19 MED ORDER — SERTRALINE HCL 25 MG PO TABS: ORAL_TABLET | ORAL | 1 refills | Status: DC

## 2019-01-23 ENCOUNTER — Other Ambulatory Visit: Payer: Self-pay | Admitting: Family Medicine

## 2019-01-23 MED ORDER — HYDROCODONE-ACETAMINOPHEN 10-325 MG PO TABS: 1.0000 | ORAL_TABLET | Freq: Three times a day (TID) | ORAL | 0 refills | Status: DC | PRN

## 2019-01-23 NOTE — Telephone Encounter (Signed)
Name of Medication: Hydrocodone apap 10-325 mg Name of [facility] or Written Date and Quantity:# 40 on 12/24/18 Last Office Visit and Type: 12/15/18 annual Next Office Visit and Type: none scheduled Last Controlled Substance Agreement Date: 06/23/18 Last UDS:06/18/2018

## 2019-01-23 NOTE — Telephone Encounter (Signed)
Erx

## 2019-01-23 NOTE — Telephone Encounter (Signed)
Patient is requesting a refill  HYDROcodone   3 tablets left    [facility]

## 2019-01-26 DIAGNOSIS — M5481 Occipital neuralgia: Secondary | ICD-10-CM | POA: Diagnosis not present

## 2019-01-26 DIAGNOSIS — G8929 Other chronic pain: Secondary | ICD-10-CM | POA: Diagnosis not present

## 2019-01-26 DIAGNOSIS — M533 Sacrococcygeal disorders, not elsewhere classified: Secondary | ICD-10-CM | POA: Diagnosis not present

## 2019-01-26 DIAGNOSIS — M542 Cervicalgia: Secondary | ICD-10-CM | POA: Diagnosis not present

## 2019-01-26 DIAGNOSIS — M5441 Lumbago with sciatica, right side: Secondary | ICD-10-CM | POA: Diagnosis not present

## 2019-01-26 DIAGNOSIS — M7918 Myalgia, other site: Secondary | ICD-10-CM | POA: Diagnosis not present

## 2019-01-26 DIAGNOSIS — M5442 Lumbago with sciatica, left side: Secondary | ICD-10-CM | POA: Diagnosis not present

## 2019-01-27 DIAGNOSIS — M5416 Radiculopathy, lumbar region: Secondary | ICD-10-CM | POA: Diagnosis not present

## 2019-01-27 DIAGNOSIS — M7582 Other shoulder lesions, left shoulder: Secondary | ICD-10-CM | POA: Diagnosis not present

## 2019-01-27 DIAGNOSIS — M19012 Primary osteoarthritis, left shoulder: Secondary | ICD-10-CM | POA: Diagnosis not present

## 2019-01-27 DIAGNOSIS — S46012D Strain of muscle(s) and tendon(s) of the rotator cuff of left shoulder, subsequent encounter: Secondary | ICD-10-CM | POA: Diagnosis not present

## 2019-01-27 DIAGNOSIS — M1612 Unilateral primary osteoarthritis, left hip: Secondary | ICD-10-CM | POA: Diagnosis not present

## 2019-01-27 DIAGNOSIS — M7542 Impingement syndrome of left shoulder: Secondary | ICD-10-CM | POA: Diagnosis not present

## 2019-01-27 DIAGNOSIS — Z981 Arthrodesis status: Secondary | ICD-10-CM | POA: Diagnosis not present

## 2019-01-27 DIAGNOSIS — S43432D Superior glenoid labrum lesion of left shoulder, subsequent encounter: Secondary | ICD-10-CM | POA: Diagnosis not present

## 2019-02-04 ENCOUNTER — Ambulatory Visit (INDEPENDENT_AMBULATORY_CARE_PROVIDER_SITE_OTHER)
Admission: RE | Admit: 2019-02-04 | Discharge: 2019-02-04 | Disposition: A | Discharge status: Home / Self Care | Payer: Medicare HMO | Source: Ambulatory Visit | Attending: Family Medicine | Admitting: Family Medicine

## 2019-02-04 ENCOUNTER — Ambulatory Visit (INDEPENDENT_AMBULATORY_CARE_PROVIDER_SITE_OTHER): Payer: Medicare HMO | Admitting: Family Medicine

## 2019-02-04 ENCOUNTER — Encounter: Payer: Self-pay | Admitting: Family Medicine

## 2019-02-04 ENCOUNTER — Other Ambulatory Visit: Payer: Self-pay

## 2019-02-04 VITALS — BP 132/80 | HR 83 | Temp 97.9°F | Ht 73.5 in | Wt 247.1 lb

## 2019-02-04 DIAGNOSIS — M545 Low back pain, unspecified: Secondary | ICD-10-CM

## 2019-02-04 DIAGNOSIS — Z01818 Encounter for other preprocedural examination: Secondary | ICD-10-CM | POA: Insufficient documentation

## 2019-02-04 DIAGNOSIS — I2510 Atherosclerotic heart disease of native coronary artery without angina pectoris: Secondary | ICD-10-CM | POA: Diagnosis not present

## 2019-02-04 DIAGNOSIS — M542 Cervicalgia: Secondary | ICD-10-CM

## 2019-02-04 DIAGNOSIS — G8929 Other chronic pain: Secondary | ICD-10-CM | POA: Diagnosis not present

## 2019-02-04 DIAGNOSIS — I700 Atherosclerosis of aorta: Secondary | ICD-10-CM | POA: Diagnosis not present

## 2019-02-04 DIAGNOSIS — S46002D Unspecified injury of muscle(s) and tendon(s) of the rotator cuff of left shoulder, subsequent encounter: Secondary | ICD-10-CM

## 2019-02-04 DIAGNOSIS — R739 Hyperglycemia, unspecified: Secondary | ICD-10-CM

## 2019-02-04 DIAGNOSIS — J439 Emphysema, unspecified: Secondary | ICD-10-CM | POA: Diagnosis not present

## 2019-02-04 LAB — CBC WITH DIFFERENTIAL/PLATELET
Basophils Absolute: 0 10*3/uL (ref 0.0–0.1)
Basophils Relative: 0.6 % (ref 0.0–3.0)
Eosinophils Absolute: 0.1 10*3/uL (ref 0.0–0.7)
Eosinophils Relative: 2.2 % (ref 0.0–5.0)
HCT: 41.9 % (ref 39.0–52.0)
Hemoglobin: 13.7 g/dL (ref 13.0–17.0)
Lymphocytes Relative: 43.4 % (ref 12.0–46.0)
Lymphs Abs: 2.6 10*3/uL (ref 0.7–4.0)
MCHC: 32.7 g/dL (ref 30.0–36.0)
MCV: 80.7 fl (ref 78.0–100.0)
Monocytes Absolute: 0.5 10*3/uL (ref 0.1–1.0)
Monocytes Relative: 8.8 % (ref 3.0–12.0)
Neutro Abs: 2.7 10*3/uL (ref 1.4–7.7)
Neutrophils Relative %: 45 % (ref 43.0–77.0)
Platelets: 174 10*3/uL (ref 150.0–400.0)
RBC: 5.19 Mil/uL (ref 4.22–5.81)
RDW: 13.6 % (ref 11.5–15.5)
WBC: 6.1 10*3/uL (ref 4.0–10.5)

## 2019-02-04 LAB — POC URINALSYSI DIPSTICK (AUTOMATED)
Bilirubin, UA: NEGATIVE
Glucose, UA: NEGATIVE
Ketones, UA: NEGATIVE
Leukocytes, UA: NEGATIVE
Nitrite, UA: NEGATIVE
Protein, UA: NEGATIVE
Spec Grav, UA: 1.02 (ref 1.010–1.025)
Urobilinogen, UA: 0.2 E.U./dL
pH, UA: 6 (ref 5.0–8.0)

## 2019-02-04 LAB — BRAIN NATRIURETIC PEPTIDE: Pro B Natriuretic peptide (BNP): 11 pg/mL (ref 0.0–100.0)

## 2019-02-04 LAB — HEMOGLOBIN A1C: Hgb A1c MFr Bld: 6 % (ref 4.6–6.5)

## 2019-02-04 NOTE — Assessment & Plan Note (Signed)
Asxs, off respiratory meds. Quit smoking 2005. Update CXR.

## 2019-02-04 NOTE — Patient Instructions (Addendum)
Labs and chest xray, urinalysis today.  We will forward all results to orthopedic office for upcoming surgery.  You are doing well today.

## 2019-02-04 NOTE — Progress Notes (Signed)
This visit was conducted in person.  BP 132/80 (BP Location: Right Arm, Patient Position: Sitting, Cuff Size: Large)   Pulse 83   Temp 97.9 F (36.6 C) (Temporal)   Ht 6' 1.5" (1.867 m)   Wt 247 lb 2 oz (112.1 kg)   SpO2 96%   BMI 32.16 kg/m    CC: preop eval Subjective:    Patient ID: Rodney Charles, male    DOB: [birth year], 63 y.o.   MRN: [id number]  HPI: Rodney Charles is a 63 y.o. male presenting on 02/04/2019 for Pre-op Exam (Needs surgery on left shoulder.  Pt provided form to be completed. Not currently scheduled.  )   Planning to have L shoulder orthopedic surgery by Dr Tomi Likens at [facility] in [place]. Told he has torn rotator cuff. Planned general anesthesia.   He has had R shoulder surgery (10/2013) as well as multiple neck and lumbar back surgeries, has tolerated GETA well in the past. No postop nausea/vomiting.   Activity limited by chronic back pain.  Denies denies chest pain/tightness, dyspnea, cough, dizziness, headaches, nausea or abd pain.   Last lung cancer screening CT 06/2018 - aortic atherosclerosis, emphysema, coronary artery calcifications.      Relevant past medical, surgical, family and social history reviewed and updated as indicated. Interim medical history since our last visit reviewed. Allergies and medications reviewed and updated. Outpatient Medications Prior to Visit  Medication Sig Dispense Refill  . acetaminophen (TYLENOL) 500 MG tablet Take 500 mg by mouth every 6 (six) hours as needed.    . bisacodyl (BISACODYL) 5 MG EC tablet Take 1 tablet (5 mg total) by mouth daily as needed for moderate constipation. 30 tablet 0  . celecoxib (CELEBREX) 100 MG capsule TAKE 1 CAPSULE(100 MG) BY MOUTH DAILY 90 capsule 1  . colchicine (COLCRYS) 0.6 MG tablet TAKE 1 TABLET(0.6 MG) BY MOUTH DAILY AS NEEDED FOR JOINT PAIN 30 tablet 3  . esomeprazole (NEXIUM) 40 MG capsule Take 40 mg by mouth daily before breakfast.    . fluticasone (FLONASE) 50  MCG/ACT nasal spray SHAKE LIQUID AND USE 2 SPRAYS IN EACH NOSTRIL DAILY 16 g 5  . gabapentin (NEURONTIN) 300 MG capsule TAKE 1 CAPSULE(300 MG) BY MOUTH THREE TIMES DAILY 270 capsule 1  . HYDROcodone-acetaminophen (NORCO) 10-325 MG tablet Take 1 tablet by mouth every 8 (eight) hours as needed. 40 tablet 0  . Menthol, Topical Analgesic, (BIOFREEZE EX) Apply topically as directed.    . methocarbamol (ROBAXIN) 500 MG tablet Take 1 tablet (500 mg total) by mouth 3 (three) times daily as needed for muscle spasms (sedation precautions). 50 tablet 3  . Multiple Vitamin (MULTIVITAMIN) tablet Take 1 tablet by mouth daily.    Marland Kitchen MYRBETRIQ 25 MG TB24 tablet Take 25 mg by mouth daily.    . naproxen (NAPROSYN) 500 MG tablet Take 1 tablet (500 mg total) by mouth 2 (two) times daily. With meals 20 tablet 0  . nortriptyline (PAMELOR) 25 MG capsule Take 1 capsule (25 mg total) by mouth at bedtime. 90 capsule 1  . omeprazole (PRILOSEC) 40 MG capsule TK ONE C PO  ONCE A DAY  4  . rosuvastatin (CRESTOR) 5 MG tablet TAKE 1 TABLET(5 MG) BY MOUTH EVERY OTHER DAY 90 tablet 1  . sertraline (ZOLOFT) 25 MG tablet TAKE 1 TABLET(25 MG) BY MOUTH DAILY 90 tablet 1   No facility-administered medications prior to visit.     Per HPI unless specifically indicated in ROS section  below Review of Systems Objective:    BP 132/80 (BP Location: Right Arm, Patient Position: Sitting, Cuff Size: Large)   Pulse 83   Temp 97.9 F (36.6 C) (Temporal)   Ht 6' 1.5" (1.867 m)   Wt 247 lb 2 oz (112.1 kg)   SpO2 96%   BMI 32.16 kg/m   Wt Readings from Last 3 Encounters:  02/04/19 247 lb 2 oz (112.1 kg)  12/15/18 251 lb 4 oz (114 kg)  10/27/18 256 lb 3 oz (116.2 kg)    Physical Exam Vitals and nursing note reviewed.  Constitutional:      Appearance: Normal appearance. He is not ill-appearing.  Eyes:     Extraocular Movements: Extraocular movements intact.     Pupils: Pupils are equal, round, and reactive to light.     Comments:  Cataracts present bilaterally  Neck:     Thyroid: No thyromegaly or thyroid tenderness.  Cardiovascular:     Rate and Rhythm: Normal rate and regular rhythm.     Pulses: Normal pulses.     Heart sounds: Normal heart sounds. No murmur.  Pulmonary:     Effort: Pulmonary effort is normal. No respiratory distress.     Breath sounds: Normal breath sounds. No wheezing, rhonchi or rales.  Musculoskeletal:     Right lower leg: No edema.     Left lower leg: No edema.  Skin:    Findings: No rash.  Neurological:     Mental Status: He is alert.  Psychiatric:        Mood and Affect: Mood normal.        Behavior: Behavior normal.       Results for orders placed or performed in visit on 12/08/18  PSA, Medicare  Result Value Ref Range   PSA 0.50 0.10 - 4.00 ng/ml  Uric acid  Result Value Ref Range   Uric Acid, Serum 6.8 4.0 - 7.8 mg/dL  Comprehensive metabolic panel  Result Value Ref Range   Sodium 138 135 - 145 mEq/L   Potassium 3.5 3.5 - 5.1 mEq/L   Chloride 102 96 - 112 mEq/L   CO2 28 19 - 32 mEq/L   Glucose, Bld 118 (H) 70 - 99 mg/dL   BUN 12 6 - 23 mg/dL   Creatinine, Ser 1.17 0.40 - 1.50 mg/dL   Total Bilirubin 0.8 0.2 - 1.2 mg/dL   Alkaline Phosphatase 57 39 - 117 U/L   AST 14 0 - 37 U/L   ALT 13 0 - 53 U/L   Total Protein 6.8 6.0 - 8.3 g/dL   Albumin 4.3 3.5 - 5.2 g/dL   Calcium 9.2 8.4 - 10.5 mg/dL   GFR 76.71 >60.00 mL/min  Lipid panel  Result Value Ref Range   Cholesterol 198 0 - 200 mg/dL   Triglycerides 77.0 0.0 - 149.0 mg/dL   HDL 62.80 >39.00 mg/dL   VLDL 15.4 0.0 - 40.0 mg/dL   LDL Cholesterol 119 (H) 0 - 99 mg/dL   Total CHOL/HDL Ratio 3    NonHDL 134.80    EKG 12/2018- sinus bradycardia 50s, normal axis, intervals, diffuse T wave flattening inferiorly largely unchanged from EKG 2012, new T wave inversions laterally.  Assessment & Plan:  This visit occurred during the SARS-CoV-2 public health emergency.  Safety protocols were in place, including screening  questions prior to the visit, additional usage of staff PPE, and extensive cleaning of exam room while observing appropriate contact time as indicated for disinfecting solutions.  Problem List Items Addressed This Visit    Rotator cuff injury, left, subsequent encounter    Upcoming L shoulder surgery.       Pre-op evaluation - Primary    RCRI = 0 Check labwork today as well as CXR and urinalysis.  Has tolerated GETA well in the past, latest 2015. Anticipate adequately low risk to proceed with upcoming L shoulder surgery.  Will forward results to ortho office.       Relevant Orders   CBC with Differential   Brain natriuretic peptide   DG Chest 2 View   Emphysema of lung ([facility])    Asxs, off respiratory meds. Quit smoking 2005. Update CXR.       Chronic neck pain   Chronic lower back pain   CAD (coronary artery disease)    Asxs, on crestor.       Aortic atherosclerosis ([facility])    Continue crestor.        Other Visit Diagnoses    Hyperglycemia       Relevant Orders   Hemoglobin A1c       No orders of the defined types were placed in this encounter.  Orders Placed This Encounter  Procedures  . DG Chest 2 View    Standing Status:   Future    Number of Occurrences:   1    Standing Expiration Date:   04/04/2020    Order Specific Question:   Reason for Exam (SYMPTOM  OR DIAGNOSIS REQUIRED)    Answer:   preop eval    Order Specific Question:   Preferred imaging location?    Answer:   [facility]    Order Specific Question:   Radiology Contrast Protocol - do NOT remove file path    Answer:   [email or web address]  . CBC with Differential  . Brain natriuretic peptide  . Hemoglobin A1c    Follow up plan: No follow-ups on file.  Ria Bush, MD

## 2019-02-04 NOTE — Assessment & Plan Note (Signed)
Continue crestor 

## 2019-02-04 NOTE — Addendum Note (Signed)
Addended by: Brenton Grills on: [id number] 10:54 AM   Modules accepted: Orders

## 2019-02-04 NOTE — Assessment & Plan Note (Addendum)
Upcoming L shoulder surgery.

## 2019-02-04 NOTE — Assessment & Plan Note (Signed)
Asxs, on crestor.

## 2019-02-04 NOTE — Assessment & Plan Note (Addendum)
RCRI = 0 Check labwork today as well as CXR and urinalysis.  Has tolerated GETA well in the past, latest 2015. Anticipate adequately low risk to proceed with upcoming L shoulder surgery.  Will forward results to ortho office.

## 2019-02-07 ENCOUNTER — Encounter: Payer: Self-pay | Admitting: Family Medicine

## 2019-02-07 DIAGNOSIS — R7303 Prediabetes: Secondary | ICD-10-CM | POA: Insufficient documentation

## 2019-02-23 ENCOUNTER — Other Ambulatory Visit: Payer: Self-pay | Admitting: Family Medicine

## 2019-02-23 MED ORDER — HYDROCODONE-ACETAMINOPHEN 10-325 MG PO TABS: 1.0000 | ORAL_TABLET | Freq: Three times a day (TID) | ORAL | 0 refills | Status: DC | PRN

## 2019-02-23 NOTE — Telephone Encounter (Signed)
Patient is requesting a refill  HYDROcodone   Patient has 2 tablets left.     [facility]

## 2019-02-23 NOTE — Telephone Encounter (Signed)
Name of Medication: hydrocodone apap 10-325 mg Name of Pharmacy: [facility] or Written Date and Quantity:#40 on 01/23/19  Last Office Visit and Type: 02/04/19 pre op eval Next Office Visit and Type: none Last Controlled Substance Agreement Date: 06/23/18 Last UDS:06/18/18

## 2019-02-23 NOTE — Telephone Encounter (Signed)
ERx 

## 2019-03-03 DIAGNOSIS — M7918 Myalgia, other site: Secondary | ICD-10-CM | POA: Diagnosis not present

## 2019-03-03 DIAGNOSIS — M5442 Lumbago with sciatica, left side: Secondary | ICD-10-CM | POA: Diagnosis not present

## 2019-03-03 DIAGNOSIS — G8929 Other chronic pain: Secondary | ICD-10-CM | POA: Diagnosis not present

## 2019-03-03 DIAGNOSIS — M533 Sacrococcygeal disorders, not elsewhere classified: Secondary | ICD-10-CM | POA: Diagnosis not present

## 2019-03-03 DIAGNOSIS — M542 Cervicalgia: Secondary | ICD-10-CM | POA: Diagnosis not present

## 2019-03-03 DIAGNOSIS — M5441 Lumbago with sciatica, right side: Secondary | ICD-10-CM | POA: Diagnosis not present

## 2019-03-03 DIAGNOSIS — M5481 Occipital neuralgia: Secondary | ICD-10-CM | POA: Diagnosis not present

## 2019-03-05 ENCOUNTER — Telehealth: Payer: Self-pay

## 2019-03-05 NOTE — Telephone Encounter (Signed)
Received letter from [facility] stating Colcrys 0.6 mg tab is non-formulary.  However, and alternative is Mitigare capsule.  Placed letter in Dr. Synthia Innocent box.

## 2019-03-06 MED ORDER — COLCHICINE 0.6 MG PO CAPS: 1.0000 | ORAL_CAPSULE | Freq: Every day | ORAL | 3 refills | Status: DC | PRN

## 2019-03-06 NOTE — Telephone Encounter (Signed)
mitigare sent in.

## 2019-03-06 NOTE — Addendum Note (Signed)
Addended by: Ria Bush on: 03/06/2019 07:22 AM   Modules accepted: Orders

## 2019-03-17 ENCOUNTER — Encounter: Payer: Self-pay | Admitting: Family Medicine

## 2019-03-17 ENCOUNTER — Other Ambulatory Visit: Payer: Self-pay

## 2019-03-17 ENCOUNTER — Ambulatory Visit (INDEPENDENT_AMBULATORY_CARE_PROVIDER_SITE_OTHER): Payer: Medicare HMO | Admitting: Family Medicine

## 2019-03-17 VITALS — BP 126/70 | HR 82 | Temp 97.5°F | Ht 73.5 in | Wt 248.2 lb

## 2019-03-17 DIAGNOSIS — S46002D Unspecified injury of muscle(s) and tendon(s) of the rotator cuff of left shoulder, subsequent encounter: Secondary | ICD-10-CM | POA: Diagnosis not present

## 2019-03-17 DIAGNOSIS — G8929 Other chronic pain: Secondary | ICD-10-CM

## 2019-03-17 DIAGNOSIS — M542 Cervicalgia: Secondary | ICD-10-CM | POA: Diagnosis not present

## 2019-03-17 DIAGNOSIS — M545 Low back pain: Secondary | ICD-10-CM

## 2019-03-17 DIAGNOSIS — Z736 Limitation of activities due to disability: Secondary | ICD-10-CM

## 2019-03-17 DIAGNOSIS — M109 Gout, unspecified: Secondary | ICD-10-CM | POA: Diagnosis not present

## 2019-03-17 MED ORDER — NORTRIPTYLINE HCL 25 MG PO CAPS: 25.0000 mg | ORAL_CAPSULE | Freq: Every day | ORAL | 3 refills | Status: DC

## 2019-03-17 NOTE — Patient Instructions (Addendum)
Continue current medicines.  Good to see you today Return as needed or in 3 months for follow up visit.

## 2019-03-17 NOTE — Assessment & Plan Note (Signed)
No recent flare, not on daily urate lowering medicine.  Continue colchicine PRN.

## 2019-03-17 NOTE — Assessment & Plan Note (Signed)
Has been recommended shoulder surgery for RTC injury - wants to wait until after pandemic to undergo elective procedure.
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
## 2019-03-17 NOTE — Assessment & Plan Note (Addendum)
[place] CSRS reviewed.  Stable period on nortriptyline, gabapentin, celebrex, robaxin and hydrocodone 10/325mg  #40/mo.  Tolerating med well, constipation managed.

## 2019-03-27 ENCOUNTER — Other Ambulatory Visit: Payer: Self-pay | Admitting: Family Medicine

## 2019-03-27 MED ORDER — HYDROCODONE-ACETAMINOPHEN 10-325 MG PO TABS: 1.0000 | ORAL_TABLET | Freq: Three times a day (TID) | ORAL | 0 refills | Status: DC | PRN

## 2019-03-27 NOTE — Telephone Encounter (Signed)
Pt notified as instructed by phone.

## 2019-03-27 NOTE — Telephone Encounter (Signed)
Patient called requesting refill  HYDROcodone   Patient is completely out of medication    [place]

## 2019-03-27 NOTE — Telephone Encounter (Signed)
plz notify E prescribed 

## 2019-03-27 NOTE — Telephone Encounter (Signed)
Name of Medication: Hydrocodone apap 10-325 mg Name of [place] or Written Date and Quantity: # 40 on 02/23/19 Last Office Visit and Type:03/17/2019 pain mgt  Next Office Visit and Type: 06/15/2019 pain mgt Last Controlled Substance Agreement Date:06/23/2018  Last UDS:06/18/2018    Per Courtneys note with talking with pt Pt is out of med.

## 2019-03-31 DIAGNOSIS — M5441 Lumbago with sciatica, right side: Secondary | ICD-10-CM | POA: Diagnosis not present

## 2019-03-31 DIAGNOSIS — M5442 Lumbago with sciatica, left side: Secondary | ICD-10-CM | POA: Diagnosis not present

## 2019-03-31 DIAGNOSIS — M542 Cervicalgia: Secondary | ICD-10-CM | POA: Diagnosis not present

## 2019-03-31 DIAGNOSIS — M7918 Myalgia, other site: Secondary | ICD-10-CM | POA: Diagnosis not present

## 2019-03-31 DIAGNOSIS — M533 Sacrococcygeal disorders, not elsewhere classified: Secondary | ICD-10-CM | POA: Diagnosis not present

## 2019-04-02 DIAGNOSIS — Z86010 Personal history of colonic polyps: Secondary | ICD-10-CM | POA: Diagnosis not present

## 2019-04-02 DIAGNOSIS — Z1211 Encounter for screening for malignant neoplasm of colon: Secondary | ICD-10-CM | POA: Diagnosis not present

## 2019-04-02 DIAGNOSIS — K5900 Constipation, unspecified: Secondary | ICD-10-CM | POA: Diagnosis not present

## 2019-04-02 DIAGNOSIS — K219 Gastro-esophageal reflux disease without esophagitis: Secondary | ICD-10-CM | POA: Diagnosis not present

## 2019-04-06 HISTORY — PX: COLONOSCOPY: SHX174

## 2019-04-24 ENCOUNTER — Telehealth: Payer: Self-pay | Admitting: Family Medicine

## 2019-04-24 DIAGNOSIS — M5416 Radiculopathy, lumbar region: Secondary | ICD-10-CM | POA: Diagnosis not present

## 2019-04-24 DIAGNOSIS — Z981 Arthrodesis status: Secondary | ICD-10-CM | POA: Diagnosis not present

## 2019-04-24 DIAGNOSIS — M19012 Primary osteoarthritis, left shoulder: Secondary | ICD-10-CM | POA: Diagnosis not present

## 2019-04-24 DIAGNOSIS — M1612 Unilateral primary osteoarthritis, left hip: Secondary | ICD-10-CM | POA: Diagnosis not present

## 2019-04-24 DIAGNOSIS — M7542 Impingement syndrome of left shoulder: Secondary | ICD-10-CM | POA: Diagnosis not present

## 2019-04-24 DIAGNOSIS — S46012D Strain of muscle(s) and tendon(s) of the rotator cuff of left shoulder, subsequent encounter: Secondary | ICD-10-CM | POA: Diagnosis not present

## 2019-04-24 DIAGNOSIS — M7582 Other shoulder lesions, left shoulder: Secondary | ICD-10-CM | POA: Diagnosis not present

## 2019-04-24 DIAGNOSIS — S43432D Superior glenoid labrum lesion of left shoulder, subsequent encounter: Secondary | ICD-10-CM | POA: Diagnosis not present

## 2019-04-24 MED ORDER — HYDROCODONE-ACETAMINOPHEN 10-325 MG PO TABS: 1.0000 | ORAL_TABLET | Freq: Three times a day (TID) | ORAL | 0 refills | Status: DC | PRN

## 2019-04-24 NOTE — Telephone Encounter (Signed)
Pt called to get a refill on Hydrocodone [facility] [address] Pt has one pill left

## 2019-04-24 NOTE — Telephone Encounter (Signed)
ERx 

## 2019-04-24 NOTE — Telephone Encounter (Signed)
Last refilled 03/27/19 #40 x 0 refills.   Please advise, thanks.

## 2019-04-27 DIAGNOSIS — K635 Polyp of colon: Secondary | ICD-10-CM | POA: Diagnosis not present

## 2019-04-27 DIAGNOSIS — Z86010 Personal history of colonic polyps: Secondary | ICD-10-CM | POA: Diagnosis not present

## 2019-04-27 DIAGNOSIS — D122 Benign neoplasm of ascending colon: Secondary | ICD-10-CM | POA: Diagnosis not present

## 2019-04-27 DIAGNOSIS — Z1211 Encounter for screening for malignant neoplasm of colon: Secondary | ICD-10-CM | POA: Diagnosis not present

## 2019-04-27 DIAGNOSIS — D123 Benign neoplasm of transverse colon: Secondary | ICD-10-CM | POA: Diagnosis not present

## 2019-05-25 ENCOUNTER — Other Ambulatory Visit: Payer: Self-pay

## 2019-05-25 NOTE — Telephone Encounter (Signed)
Patient called requesting a refill on Hydrocodone - last refilled on 04/24/19 for #40 with 0 refills.  Patient was last seen on 03/17/19 and has an upcoming appt on 06/15/19.  Is this ok to refill?

## 2019-05-27 MED ORDER — HYDROCODONE-ACETAMINOPHEN 10-325 MG PO TABS: 1.0000 | ORAL_TABLET | Freq: Three times a day (TID) | ORAL | 0 refills | Status: DC | PRN

## 2019-05-27 NOTE — Telephone Encounter (Signed)
ERx 

## 2019-06-15 ENCOUNTER — Other Ambulatory Visit: Payer: Self-pay | Admitting: Family Medicine

## 2019-06-15 ENCOUNTER — Ambulatory Visit (INDEPENDENT_AMBULATORY_CARE_PROVIDER_SITE_OTHER): Payer: Medicare HMO | Admitting: Family Medicine

## 2019-06-15 ENCOUNTER — Encounter: Payer: Self-pay | Admitting: Family Medicine

## 2019-06-15 ENCOUNTER — Other Ambulatory Visit: Payer: Self-pay

## 2019-06-15 VITALS — BP 134/86 | HR 83 | Temp 97.9°F | Ht 73.5 in | Wt 254.5 lb

## 2019-06-15 DIAGNOSIS — M542 Cervicalgia: Secondary | ICD-10-CM

## 2019-06-15 DIAGNOSIS — R351 Nocturia: Secondary | ICD-10-CM | POA: Diagnosis not present

## 2019-06-15 DIAGNOSIS — M545 Low back pain: Secondary | ICD-10-CM | POA: Diagnosis not present

## 2019-06-15 DIAGNOSIS — G8929 Other chronic pain: Secondary | ICD-10-CM

## 2019-06-15 DIAGNOSIS — S46002D Unspecified injury of muscle(s) and tendon(s) of the rotator cuff of left shoulder, subsequent encounter: Secondary | ICD-10-CM | POA: Diagnosis not present

## 2019-06-15 MED ORDER — NORTRIPTYLINE HCL 10 MG PO CAPS: 10.0000 mg | ORAL_CAPSULE | Freq: Every day | ORAL | 6 refills | Status: DC

## 2019-06-15 NOTE — Patient Instructions (Addendum)
Let's decrease nortriptyline to 10mg  capsules, 1-2 at bedtime.  Return in 3 months for chronic pain visit.  Update UDS today.

## 2019-06-15 NOTE — Assessment & Plan Note (Signed)
Significant improvement on myrbetriq per urology.

## 2019-06-15 NOTE — Assessment & Plan Note (Addendum)
[place] CSRS reviewed Update UDS today.  Oversedation on nortriptyline - will decrease to 10-20mg  nightly.  Continue other medicines at this time.

## 2019-06-15 NOTE — Addendum Note (Signed)
Addended by: Brenton Grills on: [id number] PM   Modules accepted: Orders
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
## 2019-06-15 NOTE — Telephone Encounter (Signed)
Gabapentin last filled:  03/17/19, #270 Celebrex last filled: 03/17/19, #90 Mitigare last filled:  05/16/19, #30 Last OV: today, chronic pain mngmt Next OV:  09/15/19, 3 mo chronic pain mngmnt

## 2019-06-17 LAB — DRUG MONITORING, PANEL 8 WITH CONFIRMATION, URINE
6 Acetylmorphine: NEGATIVE ng/mL (ref <10)
Alcohol Metabolites: POSITIVE ng/mL — AB
Amphetamines: NEGATIVE ng/mL (ref <500)
Benzodiazepines: NEGATIVE ng/mL (ref <100)
Buprenorphine, Urine: NEGATIVE ng/mL (ref <5)
Cocaine Metabolite: NEGATIVE ng/mL (ref <150)
Codeine: NEGATIVE ng/mL (ref <50)
Creatinine: 126.1 mg/dL
Ethyl Glucuronide (ETG): 1242 ng/mL — ABNORMAL HIGH (ref <500)
Ethyl Sulfate (ETS): 725 ng/mL — ABNORMAL HIGH (ref <100)
Hydrocodone: 880 ng/mL — ABNORMAL HIGH (ref <50)
Hydromorphone: 255 ng/mL — ABNORMAL HIGH (ref <50)
MDMA: NEGATIVE ng/mL (ref <500)
Marijuana Metabolite: NEGATIVE ng/mL (ref <20)
Morphine: NEGATIVE ng/mL (ref <50)
Norhydrocodone: 1166 ng/mL — ABNORMAL HIGH (ref <50)
Opiates: POSITIVE ng/mL — AB (ref <100)
Oxidant: NEGATIVE ug/mL
Oxycodone: NEGATIVE ng/mL (ref <100)
pH: 5.5 (ref 4.5–9.0)

## 2019-06-17 LAB — DM TEMPLATE

## 2019-06-18 ENCOUNTER — Telehealth: Payer: Self-pay | Admitting: Family Medicine

## 2019-06-18 NOTE — Telephone Encounter (Signed)
Pt aware of results.  [see Labs, Result Note, 06/15/19]

## 2019-06-18 NOTE — Telephone Encounter (Signed)
Patient returned called in regards to his lab results   Unable to attach to result notes.

## 2019-06-22 ENCOUNTER — Other Ambulatory Visit: Payer: Self-pay | Admitting: Family Medicine

## 2019-06-26 ENCOUNTER — Other Ambulatory Visit: Payer: Self-pay

## 2019-06-26 MED ORDER — HYDROCODONE-ACETAMINOPHEN 10-325 MG PO TABS: 1.0000 | ORAL_TABLET | Freq: Three times a day (TID) | ORAL | 0 refills | Status: DC | PRN

## 2019-06-26 NOTE — Telephone Encounter (Signed)
ERx 

## 2019-06-26 NOTE — Telephone Encounter (Signed)
Pt notified that hydrocodone apap 10/325 mg was sent electronically [facility]. Pt voiced understanding and nothing further needed.

## 2019-06-26 NOTE — Telephone Encounter (Signed)
Patient contacted the office and stated he needs a refill on Hydrocodone. Last refilled on 05/27/19 for #40 with 0 refills. Patient was last seen on 06/15/19 and has an upcoming appt on 09/15/19. Is this ok to refill?

## 2019-07-07 ENCOUNTER — Ambulatory Visit (INDEPENDENT_AMBULATORY_CARE_PROVIDER_SITE_OTHER)
Admission: RE | Admit: 2019-07-07 | Discharge: 2019-07-07 | Disposition: A | Discharge status: Home / Self Care | Payer: Medicare HMO | Source: Ambulatory Visit | Attending: Acute Care | Admitting: Acute Care

## 2019-07-07 ENCOUNTER — Other Ambulatory Visit: Payer: Self-pay

## 2019-07-07 DIAGNOSIS — Z87891 Personal history of nicotine dependence: Secondary | ICD-10-CM | POA: Diagnosis not present

## 2019-07-07 DIAGNOSIS — Z122 Encounter for screening for malignant neoplasm of respiratory organs: Secondary | ICD-10-CM

## 2019-07-09 NOTE — Progress Notes (Signed)
Please call patient and let them  know their  low dose Ct was read as a Lung RADS 2: nodules that are benign in appearance and behavior with a very low likelihood of becoming a clinically active cancer due to size or lack of growth. Recommendation per radiology is for a repeat LDCT in 12 months. Please let them  know he no longer qualifies for lung cancer screening as he quit > 15 years ago. Please let them  know there was notation of CAD on their  scan.  Please remind the patient  that this is a non-gated exam therefore degree or severity of disease  cannot be determined. Please have them  follow up with their PCP regarding potential risk factor modification, dietary therapy or pharmacologic therapy if clinically indicated. Pt.  is currently on statin therapy. Please fax results to PCP. Thanks so much.  ++ This patient no longer qualifies for screening as he has quit smoking > 15 years ago.   Thanks so much

## 2019-07-15 ENCOUNTER — Other Ambulatory Visit: Payer: Self-pay | Admitting: Family Medicine

## 2019-07-15 NOTE — Telephone Encounter (Signed)
Sertraline was last refilled on 01/19/19 for #90 with 1 refill.  Mitigare was last refilled on 03/06/19 for #30 with 1 refill.  Patient was last seen on 06/15/19 and has an upcoming appt on 09/15/19. Are these medications ok to refill?

## 2019-07-27 ENCOUNTER — Other Ambulatory Visit: Payer: Self-pay

## 2019-07-27 MED ORDER — HYDROCODONE-ACETAMINOPHEN 10-325 MG PO TABS: 1.0000 | ORAL_TABLET | Freq: Three times a day (TID) | ORAL | 0 refills | Status: DC | PRN

## 2019-07-27 NOTE — Telephone Encounter (Signed)
Patient contacted the office and is needing a refill on Hydrocodone. This was last refilled 06/26/19 for #40 with 0 refills. Ok to refill?

## 2019-07-27 NOTE — Telephone Encounter (Signed)
ERx 

## 2019-08-26 ENCOUNTER — Other Ambulatory Visit: Payer: Self-pay

## 2019-08-26 NOTE — Telephone Encounter (Signed)
Patient contacted the office and states that he needs a refill on his Hydrocodone. This was last refilled 07/27/38 for #40 with 0 refills.  Dr. Darnell Level, is this ok to refill?

## 2019-08-27 MED ORDER — HYDROCODONE-ACETAMINOPHEN 10-325 MG PO TABS: 1.0000 | ORAL_TABLET | Freq: Three times a day (TID) | ORAL | 0 refills | Status: DC | PRN

## 2019-08-27 NOTE — Telephone Encounter (Signed)
ERx 

## 2019-09-15 ENCOUNTER — Encounter: Payer: Self-pay | Admitting: Family Medicine

## 2019-09-15 ENCOUNTER — Ambulatory Visit (INDEPENDENT_AMBULATORY_CARE_PROVIDER_SITE_OTHER): Payer: Medicare HMO | Admitting: Family Medicine

## 2019-09-15 ENCOUNTER — Other Ambulatory Visit: Payer: Self-pay

## 2019-09-15 VITALS — BP 140/86 | HR 61 | Temp 97.7°F | Ht 73.5 in | Wt 251.0 lb

## 2019-09-15 DIAGNOSIS — M545 Low back pain, unspecified: Secondary | ICD-10-CM

## 2019-09-15 DIAGNOSIS — M542 Cervicalgia: Secondary | ICD-10-CM | POA: Diagnosis not present

## 2019-09-15 DIAGNOSIS — F39 Unspecified mood [affective] disorder: Secondary | ICD-10-CM | POA: Diagnosis not present

## 2019-09-15 DIAGNOSIS — G8929 Other chronic pain: Secondary | ICD-10-CM | POA: Diagnosis not present

## 2019-09-15 MED ORDER — NORTRIPTYLINE HCL 10 MG PO CAPS: 10.0000 mg | ORAL_CAPSULE | Freq: Every day | ORAL | 1 refills | Status: DC

## 2019-09-15 NOTE — Assessment & Plan Note (Addendum)
[place] CSRS reviewed Tolerating med well without adverse effects. Continue current regimen. Better tolerating lower dose nortriptyline

## 2019-09-15 NOTE — Assessment & Plan Note (Signed)
Stable period on sertraline and low dose nortriptyline

## 2019-09-15 NOTE — Patient Instructions (Addendum)
Good to see you today Return as needed or in 3 months for wellness visit/physical.
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
## 2019-09-15 NOTE — Assessment & Plan Note (Signed)
Chronic, stable on current regimen.  

## 2019-09-28 ENCOUNTER — Other Ambulatory Visit: Payer: Self-pay

## 2019-09-28 MED ORDER — HYDROCODONE-ACETAMINOPHEN 10-325 MG PO TABS: 1.0000 | ORAL_TABLET | Freq: Three times a day (TID) | ORAL | 0 refills | Status: DC | PRN

## 2019-09-28 NOTE — Telephone Encounter (Signed)
ERx 

## 2019-09-28 NOTE — Telephone Encounter (Signed)
Patient contacted the office and state he needs a refill on his Hydrocodone. Last refilled 08/27/19 for #40 with 0 refills.  Dr. Darnell Level, please advise.

## 2019-10-29 ENCOUNTER — Other Ambulatory Visit: Payer: Self-pay | Admitting: Family Medicine

## 2019-10-29 NOTE — Telephone Encounter (Signed)
Pt needs refill on hydrocodone sent to [facility]. Pt currently only has 1 pill left.

## 2019-10-30 MED ORDER — HYDROCODONE-ACETAMINOPHEN 10-325 MG PO TABS: 1.0000 | ORAL_TABLET | Freq: Three times a day (TID) | ORAL | 0 refills | Status: DC | PRN

## 2019-10-30 NOTE — Addendum Note (Signed)
Addended by: Brenton Grills on: 03/19/2480 50:03 AM   Modules accepted: Orders

## 2019-10-30 NOTE — Telephone Encounter (Signed)
ERx 

## 2019-10-30 NOTE — Telephone Encounter (Signed)
Name of Medication: Hydrocodone-APAP Name of Pharmacy: [facility] or Written Date and Quantity: 09/28/19, #40 Last Office Visit and Type: 09/15/19, chronic pain mngmt Next Office Visit and Type: 12/21/19, AWV prt 2 Last Controlled Substance Agreement Date: 06/15/19 Last UDS: 06/15/19

## 2019-11-20 DIAGNOSIS — I10 Essential (primary) hypertension: Secondary | ICD-10-CM | POA: Diagnosis not present

## 2019-11-20 DIAGNOSIS — Z0100 Encounter for examination of eyes and vision without abnormal findings: Secondary | ICD-10-CM | POA: Diagnosis not present

## 2019-11-20 DIAGNOSIS — H524 Presbyopia: Secondary | ICD-10-CM | POA: Diagnosis not present

## 2019-11-26 ENCOUNTER — Other Ambulatory Visit: Payer: Self-pay | Admitting: Family Medicine

## 2019-11-26 NOTE — Telephone Encounter (Signed)
Name of Medication: Hydrocodone-APAP Name of Pharmacy: [facility] or Written Date and Quantity: 10/30/19, #40 Last Office Visit and Type: 09/15/19, 3 mo pain mngmt f/u Next Office Visit and Type: 12/21/19, AWV Last Controlled Substance Agreement Date: 06/15/19 Last UDS: 06/15/19

## 2019-11-26 NOTE — Telephone Encounter (Signed)
Pt called needing to get a refill on  Hydrocodone  [facility] [address]  Pt has enough pills to last till Sunday

## 2019-11-29 MED ORDER — HYDROCODONE-ACETAMINOPHEN 10-325 MG PO TABS: 1.0000 | ORAL_TABLET | Freq: Three times a day (TID) | ORAL | 0 refills | Status: DC | PRN

## 2019-11-29 NOTE — Telephone Encounter (Signed)
ERx 

## 2019-12-12 ENCOUNTER — Other Ambulatory Visit: Payer: Self-pay | Admitting: Family Medicine

## 2019-12-12 DIAGNOSIS — M109 Gout, unspecified: Secondary | ICD-10-CM

## 2019-12-12 DIAGNOSIS — Z125 Encounter for screening for malignant neoplasm of prostate: Secondary | ICD-10-CM

## 2019-12-12 DIAGNOSIS — E785 Hyperlipidemia, unspecified: Secondary | ICD-10-CM

## 2019-12-12 DIAGNOSIS — R7303 Prediabetes: Secondary | ICD-10-CM

## 2019-12-14 ENCOUNTER — Other Ambulatory Visit: Payer: Self-pay

## 2019-12-14 ENCOUNTER — Other Ambulatory Visit (INDEPENDENT_AMBULATORY_CARE_PROVIDER_SITE_OTHER): Payer: Medicare HMO

## 2019-12-14 DIAGNOSIS — E785 Hyperlipidemia, unspecified: Secondary | ICD-10-CM | POA: Diagnosis not present

## 2019-12-14 DIAGNOSIS — R7303 Prediabetes: Secondary | ICD-10-CM | POA: Diagnosis not present

## 2019-12-14 DIAGNOSIS — Z125 Encounter for screening for malignant neoplasm of prostate: Secondary | ICD-10-CM

## 2019-12-14 DIAGNOSIS — M109 Gout, unspecified: Secondary | ICD-10-CM | POA: Diagnosis not present

## 2019-12-14 LAB — LIPID PANEL
Cholesterol: 182 mg/dL (ref 0–200)
HDL: 62.3 mg/dL (ref >39.00)
LDL Cholesterol: 111 mg/dL — ABNORMAL HIGH (ref 0–99)
NonHDL: 120.16
Total CHOL/HDL Ratio: 3
Triglycerides: 48 mg/dL (ref 0.0–149.0)
VLDL: 9.6 mg/dL (ref 0.0–40.0)

## 2019-12-14 LAB — COMPREHENSIVE METABOLIC PANEL
ALT: 16 U/L (ref 0–53)
AST: 19 U/L (ref 0–37)
Albumin: 4.5 g/dL (ref 3.5–5.2)
Alkaline Phosphatase: 57 U/L (ref 39–117)
BUN: 11 mg/dL (ref 6–23)
CO2: 26 mEq/L (ref 19–32)
Calcium: 9.2 mg/dL (ref 8.4–10.5)
Chloride: 105 mEq/L (ref 96–112)
Creatinine, Ser: 1.19 mg/dL (ref 0.40–1.50)
GFR: 65.73 mL/min (ref >60.00)
Glucose, Bld: 111 mg/dL — ABNORMAL HIGH (ref 70–99)
Potassium: 3.7 mEq/L (ref 3.5–5.1)
Sodium: 140 mEq/L (ref 135–145)
Total Bilirubin: 0.7 mg/dL (ref 0.2–1.2)
Total Protein: 6.8 g/dL (ref 6.0–8.3)

## 2019-12-14 LAB — URIC ACID: Uric Acid, Serum: 7.6 mg/dL (ref 4.0–7.8)

## 2019-12-14 LAB — HEMOGLOBIN A1C: Hgb A1c MFr Bld: 6.1 % (ref 4.6–6.5)

## 2019-12-14 LAB — PSA: PSA: 0.71 ng/mL (ref 0.10–4.00)

## 2019-12-21 ENCOUNTER — Other Ambulatory Visit: Payer: Self-pay

## 2019-12-21 ENCOUNTER — Encounter: Payer: Self-pay | Admitting: Family Medicine

## 2019-12-21 ENCOUNTER — Ambulatory Visit (INDEPENDENT_AMBULATORY_CARE_PROVIDER_SITE_OTHER): Payer: Medicare HMO | Admitting: Family Medicine

## 2019-12-21 VITALS — BP 150/98 | HR 55 | Temp 97.8°F | Ht 73.5 in | Wt 256.1 lb

## 2019-12-21 DIAGNOSIS — E785 Hyperlipidemia, unspecified: Secondary | ICD-10-CM

## 2019-12-21 DIAGNOSIS — K219 Gastro-esophageal reflux disease without esophagitis: Secondary | ICD-10-CM

## 2019-12-21 DIAGNOSIS — R7303 Prediabetes: Secondary | ICD-10-CM

## 2019-12-21 DIAGNOSIS — J439 Emphysema, unspecified: Secondary | ICD-10-CM

## 2019-12-21 DIAGNOSIS — I2510 Atherosclerotic heart disease of native coronary artery without angina pectoris: Secondary | ICD-10-CM

## 2019-12-21 DIAGNOSIS — F39 Unspecified mood [affective] disorder: Secondary | ICD-10-CM | POA: Diagnosis not present

## 2019-12-21 DIAGNOSIS — M545 Low back pain, unspecified: Secondary | ICD-10-CM

## 2019-12-21 DIAGNOSIS — Z0000 Encounter for general adult medical examination without abnormal findings: Secondary | ICD-10-CM

## 2019-12-21 DIAGNOSIS — M542 Cervicalgia: Secondary | ICD-10-CM | POA: Diagnosis not present

## 2019-12-21 DIAGNOSIS — G8929 Other chronic pain: Secondary | ICD-10-CM | POA: Diagnosis not present

## 2019-12-21 DIAGNOSIS — Z23 Encounter for immunization: Secondary | ICD-10-CM

## 2019-12-21 DIAGNOSIS — I700 Atherosclerosis of aorta: Secondary | ICD-10-CM

## 2019-12-21 DIAGNOSIS — Z7189 Other specified counseling: Secondary | ICD-10-CM | POA: Diagnosis not present

## 2019-12-21 DIAGNOSIS — I10 Essential (primary) hypertension: Secondary | ICD-10-CM | POA: Insufficient documentation

## 2019-12-21 DIAGNOSIS — M109 Gout, unspecified: Secondary | ICD-10-CM

## 2019-12-21 HISTORY — DX: Essential (primary) hypertension: I10

## 2019-12-21 MED ORDER — AMLODIPINE BESYLATE 5 MG PO TABS: 5.0000 mg | ORAL_TABLET | Freq: Every day | ORAL | 6 refills | Status: DC

## 2019-12-21 MED ORDER — SERTRALINE HCL 50 MG PO TABS
50.0000 mg | ORAL_TABLET | Freq: Every day | ORAL | 3 refills | Status: DC
Start: 2019-12-21 — End: 2020-09-21

## 2019-12-21 NOTE — Assessment & Plan Note (Signed)
Continue crestor, update EKG. Denies anginal symptoms.

## 2019-12-21 NOTE — Assessment & Plan Note (Signed)
Notes persistent depressed mood although PHQ score is low. Interested in trial higher sertraline dose - sent in.

## 2019-12-21 NOTE — Assessment & Plan Note (Signed)
Continue current regimen of low dose nortriptyline, celebrex, and hydrocodone 103/25mg .

## 2019-12-21 NOTE — Progress Notes (Signed)
Patient ID: Rodney Charles, male    DOB: [birth year], 64 y.o.   MRN: [id number]  This visit was conducted in person.  BP (!) 150/98 (BP Location: Right Arm, Patient Position: Sitting, Cuff Size: Large)   Pulse (!) 55   Temp 97.8 F (36.6 C) (Temporal)   Ht 6' 1.5" (1.867 m)   Wt 256 lb 2 oz (116.2 kg)   SpO2 96%   BMI 33.33 kg/m   BP Readings from Last 3 Encounters:  12/21/19 (!) 150/98  09/15/19 140/86  06/15/19 134/86  160s/100s on repeat testing  CC: AMW Subjective:   HPI: Rodney Charles is a 64 y.o. male presenting on 12/21/2019 for Medicare Wellness   Did not see health advisor this year.    Hearing Screening   125Hz  250Hz  500Hz  1000Hz  2000Hz  3000Hz  4000Hz  6000Hz  8000Hz   Right ear:   25 40 20  40    Left ear:   20 20 20   40    Vision Screening Comments: Last eye exam, 11/2019.    Office Visit from 12/21/2019 in [place] at [facility] Total Score 3    Denies hearing difficulties.  Fall Risk  12/21/2019 12/09/2018 12/04/2017 11/30/2016 03/21/2015  Falls in the past year? 0 0 Yes No No  Comment - - fell while walking down steps - -  Number falls in past yr: - 0 1 - -  Injury with Fall? - 0 Yes - -  Comment - - laceration to left arm - -  Risk for fall due to : - Medication side effect - - -  Follow up - Falls evaluation completed;Falls prevention discussed - - -    Chronic back pain - doing better on nortriptyline 10mg  - higher dose was overly sedating.   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  Colonoscopy 04/2014 WNL, rpt 5 yrs Collene Mares) - encouraged call to inquire about f/u due date.  Prostate cancer screening - would like continue screening. No fmhx prostate cancer. Nocturia 2x/night. Lung cancer screening - started 2017, through 2021, graduated from f/u (stopped smoking 2005).  Flu shot yearly [place] 05/2019, 06/2019  Tdap 09/2012 shingrix - 09/11/2019, rpt due ([facility]) Advanced directives - brings  copy from 2005, scanned 02/2019. This is not notarized. Would want Parke Poisson then Maretta Los and Eston Mould (daughters) to be HCPOA. No living will. Seat belt use discussed Sunscreen use discussed. No changing moles on skin Ex smoker quit 2005 Alcohol - none Dentist yearly, due - has partial upper/lower dentures Eye exam yearly  Bowel - no constipation Bladder - no incontinence  Lives alone, dog  Occupation: disability for lumbar and neck pain (~2007) Handicap placard - unable to walk >26ft w/o stopping to rest Edu: HS  Activity:walks 15 min daily Diet: good water, fruits/vegetables daily     Relevant past medical, surgical, family and social history reviewed and updated as indicated. Interim medical history since our last visit reviewed. Allergies and medications reviewed and updated. Outpatient Medications Prior to Visit  Medication Sig Dispense Refill  . celecoxib (CELEBREX) 100 MG capsule TAKE 1 CAPSULE(100 MG) BY MOUTH DAILY 90 capsule 1  . colchicine (COLCRYS) 0.6 MG tablet TAKE 1 TABLET(0.6 MG) BY MOUTH DAILY AS NEEDED FOR JOINT PAIN 30 tablet 3  . esomeprazole (NEXIUM) 40 MG capsule Take 40 mg by mouth daily before breakfast.    . fluticasone (FLONASE) 50 MCG/ACT nasal spray SHAKE LIQUID AND USE 2 SPRAYS IN [facility]  NOSTRIL DAILY 16 g 5  . gabapentin (NEURONTIN) 300 MG capsule TAKE 1 CAPSULE(300 MG) BY MOUTH THREE TIMES DAILY 270 capsule 1  . HYDROcodone-acetaminophen (NORCO) 10-325 MG tablet Take 1 tablet by mouth every 8 (eight) hours as needed. 40 tablet 0  . Menthol, Topical Analgesic, (BIOFREEZE EX) Apply topically as directed.    Marland Kitchen MITIGARE 0.6 MG CAPS TAKE 1 CAPSULE BY MOUTH DAILY AS NEEDED FOR GOUT FLARE 30 capsule 3  . Multiple Vitamin (MULTIVITAMIN) tablet Take 1 tablet by mouth daily.    Marland Kitchen MYRBETRIQ 25 MG TB24 tablet Take 25 mg by mouth daily.    . nortriptyline (PAMELOR) 10 MG capsule Take 1 capsule (10 mg total) by mouth at bedtime. 90 capsule 1  .  omeprazole (PRILOSEC) 40 MG capsule TK ONE C PO  ONCE A DAY  4  . rosuvastatin (CRESTOR) 5 MG tablet TAKE 1 TABLET BY MOUTH EVERY OTHER DAY 90 tablet 2  . sertraline (ZOLOFT) 25 MG tablet TAKE 1 TABLET(25 MG) BY MOUTH DAILY 90 tablet 1   No facility-administered medications prior to visit.     Per HPI unless specifically indicated in ROS section below Review of Systems  Constitutional: Negative for activity change, appetite change, chills, fatigue, fever and unexpected weight change.  HENT: Negative for hearing loss.   Eyes: Negative for visual disturbance.  Respiratory: Negative for cough, chest tightness, shortness of breath and wheezing.   Cardiovascular: Negative for chest pain, palpitations and leg swelling.  Gastrointestinal: Negative for abdominal distention, abdominal pain, blood in stool, constipation, diarrhea, nausea and vomiting.  Genitourinary: Negative for difficulty urinating and hematuria.  Musculoskeletal: Negative for arthralgias, myalgias and neck pain.  Skin: Negative for rash.  Neurological: Negative for dizziness, seizures, syncope and headaches.  Hematological: Negative for adenopathy. Does not bruise/bleed easily.  Psychiatric/Behavioral: Negative for dysphoric mood. The patient is not nervous/anxious.    Objective:  BP (!) 150/98 (BP Location: Right Arm, Patient Position: Sitting, Cuff Size: Large)   Pulse (!) 55   Temp 97.8 F (36.6 C) (Temporal)   Ht 6' 1.5" (1.867 m)   Wt 256 lb 2 oz (116.2 kg)   SpO2 96%   BMI 33.33 kg/m   Wt Readings from Last 3 Encounters:  12/21/19 256 lb 2 oz (116.2 kg)  09/15/19 251 lb (113.9 kg)  06/15/19 254 lb 8 oz (115.4 kg)      Physical Exam Vitals and nursing note reviewed.  Constitutional:      General: He is not in acute distress.    Appearance: Normal appearance. He is well-developed. He is not ill-appearing.  HENT:     Head: Normocephalic and atraumatic.     Right Ear: Hearing, tympanic membrane, ear canal and  external ear normal.     Left Ear: Hearing, tympanic membrane, ear canal and external ear normal.  Eyes:     General: No scleral icterus.    Conjunctiva/sclera: Conjunctivae normal.     Pupils: Pupils are equal, round, and reactive to light.  Neck:     Thyroid: No thyroid mass or thyromegaly.     Vascular: No carotid bruit.  Cardiovascular:     Rate and Rhythm: Normal rate and regular rhythm.     Pulses: Normal pulses.          Radial pulses are 2+ on the right side and 2+ on the left side.     Heart sounds: Normal heart sounds. No murmur heard.   Pulmonary:  Effort: Pulmonary effort is normal. No respiratory distress.     Breath sounds: Normal breath sounds. No wheezing, rhonchi or rales.  Abdominal:     General: Abdomen is flat. Bowel sounds are normal. There is no distension.     Palpations: Abdomen is soft. There is no mass.     Tenderness: There is no abdominal tenderness. There is no guarding or rebound.     Hernia: No hernia is present.  Musculoskeletal:        General: Normal range of motion.     Cervical back: Normal range of motion and neck supple.     Right lower leg: No edema.     Left lower leg: No edema.  Lymphadenopathy:     Cervical: No cervical adenopathy.  Skin:    General: Skin is warm and dry.     Findings: No rash.  Neurological:     General: No focal deficit present.     Mental Status: He is alert and oriented to person, place, and time.     Comments:  CN grossly intact, station and gait intact Recall 2/3, 3/3 with cue Calculation 3/5 DLORW  Psychiatric:        Mood and Affect: Mood normal.        Behavior: Behavior normal.        Thought Content: Thought content normal.        Judgment: Judgment normal.       Results for orders placed or performed in visit on 12/14/19  Uric acid  Result Value Ref Range   Uric Acid, Serum 7.6 4.0 - 7.8 mg/dL  PSA  Result Value Ref Range   PSA 0.71 0.10 - 4.00 ng/mL  Hemoglobin A1c  Result Value Ref  Range   Hgb A1c MFr Bld 6.1 4.6 - 6.5 %  Comprehensive metabolic panel  Result Value Ref Range   Sodium 140 135 - 145 mEq/L   Potassium 3.7 3.5 - 5.1 mEq/L   Chloride 105 96 - 112 mEq/L   CO2 26 19 - 32 mEq/L   Glucose, Bld 111 (H) 70 - 99 mg/dL   BUN 11 6 - 23 mg/dL   Creatinine, Ser 1.19 0.40 - 1.50 mg/dL   Total Bilirubin 0.7 0.2 - 1.2 mg/dL   Alkaline Phosphatase 57 39 - 117 U/L   AST 19 0 - 37 U/L   ALT 16 0 - 53 U/L   Total Protein 6.8 6.0 - 8.3 g/dL   Albumin 4.5 3.5 - 5.2 g/dL   GFR 65.73 >60.00 mL/min   Calcium 9.2 8.4 - 10.5 mg/dL  Lipid panel  Result Value Ref Range   Cholesterol 182 0 - 200 mg/dL   Triglycerides 48.0 0 - 149 mg/dL   HDL 62.30 >39.00 mg/dL   VLDL 9.6 0.0 - 40.0 mg/dL   LDL Cholesterol 111 (H) 0 - 99 mg/dL   Total CHOL/HDL Ratio 3    NonHDL 120.16    Depression screen [facility] 2/9 12/21/2019 12/09/2018 12/04/2017 11/30/2016 03/21/2015  Decreased Interest 1 0 0 3 2  Down, Depressed, Hopeless 2 0 3 3 2   PHQ - 2 Score 3 0 3 6 4   Altered sleeping 1 0 3 1 2   Tired, decreased energy 0 0 1 3 1   Change in appetite 0 0 0 0 0  Feeling bad or failure about yourself  0 0 1 1 1   Trouble concentrating 1 0 3 2 1   Moving slowly or fidgety/restless 0 0 0 1  2  Suicidal thoughts 0 0 0 0 0  PHQ-9 Score 5 0 11 14 11   Difficult doing work/chores - Not difficult at all Somewhat difficult Somewhat difficult Somewhat difficult   EKG - NSR rate 60, normal axis, intervals, no acute ST/T changes, ST flattening inferior and laterally unchanged from prior.  Assessment & Plan:  This visit occurred during the SARS-CoV-2 public health emergency.  Safety protocols were in place, including screening questions prior to the visit, additional usage of staff PPE, and extensive cleaning of exam room while observing appropriate contact time as indicated for disinfecting solutions.   Problem List Items Addressed This Visit    Primary hypertension    Uncontrolled - will start amlodipine 5mg   daily. Reassess at 3 month f/u visit.       Relevant Medications   amLODipine (NORVASC) 5 MG tablet   Other Relevant Orders   EKG 12-Lead (Completed)   Prediabetes    Encouraged limiting added sugar in the diet.       Mood disorder ([place])    Notes persistent depressed mood although PHQ score is low. Interested in trial higher sertraline dose - sent in.       Medicare annual wellness visit, subsequent - Primary    I have personally reviewed the Medicare Annual Wellness questionnaire and have noted 1. The patient's medical and social history 2. Their use of alcohol, tobacco or illicit drugs 3. Their current medications and supplements 4. The patient's functional ability including ADL's, fall risks, home safety risks and hearing or visual impairment. Cognitive function has been assessed and addressed as indicated.  5. Diet and physical activity 6. Evidence for depression or mood disorders The patients weight, height, BMI have been recorded in the chart. I have made referrals, counseling and provided education to the patient based on review of the above and I have provided the pt with a written personalized care plan for preventive services. Provider list updated.. See scanned questionairre as needed for further documentation. Reviewed preventative protocols and updated unless pt declined.       HLD (hyperlipidemia)    Chronic, stable on QOD crestor. LDL above goal however has had trouble tolerating higher doses or other statins.  The 10-year ASCVD risk score Mikey Bussing DC Brooke Bonito., et al., 2013) is: 16.9%   Values used to calculate the score:     Age: 89 years     Sex: Male     Is Non-Hispanic African American: Yes     Diabetic: No     Tobacco smoker: No     Systolic Blood Pressure: 774 mmHg     Is BP treated: Yes     HDL Cholesterol: 62.3 mg/dL     Total Cholesterol: 182 mg/dL       Relevant Medications   amLODipine (NORVASC) 5 MG tablet   Health maintenance examination     Preventative protocols reviewed and updated unless pt declined. Discussed healthy diet and lifestyle.       Gout    Chronic, no recent flare. Only on colchicine PRN. Consider allopurinol.       GERD (gastroesophageal reflux disease)    Continues omeprazole 40mg  daily      Encounter for chronic pain management    [place] CSRS reviewed.  RTC 3 mo chronic pain visit f/u.       Emphysema of lung ([place])    Incidental finding. Asxs off respiratory medications.  He quit smoking 2005 and is no longer eligible to continue yearly  lung cancer screening CTs.       Chronic neck pain   Relevant Medications   sertraline (ZOLOFT) 50 MG tablet   Chronic lower back pain    Continue current regimen of low dose nortriptyline, celebrex, and hydrocodone 103/25mg .       CAD (coronary artery disease)    Continue crestor, update EKG. Denies anginal symptoms.       Relevant Medications   amLODipine (NORVASC) 5 MG tablet   Aortic atherosclerosis (H[facility])    Continue crestor.       Relevant Medications   amLODipine (NORVASC) 5 MG tablet   Advanced care planning/counseling discussion    Advanced directives - brings copy from 2005, scanned 02/2019. This is not notarized. Would want Parke Poisson then Maretta Los and Eston Mould (daughters) to be HCPOA. No living will.       Other Visit Diagnoses    Need for influenza vaccination       Relevant Orders   Flu Vaccine QUAD 36+ mos IM (Completed)       Meds ordered this encounter  Medications  . sertraline (ZOLOFT) 50 MG tablet    Sig: Take 1 tablet (50 mg total) by mouth daily.    Dispense:  90 tablet    Refill:  3    Note new dose  . amLODipine (NORVASC) 5 MG tablet    Sig: Take 1 tablet (5 mg total) by mouth daily.    Dispense:  30 tablet    Refill:  6   Orders Placed This Encounter  Procedures  . Flu Vaccine QUAD 36+ mos IM  . EKG 12-Lead    Patient instructions: Call Dr Lorie Apley office to ask when next colonoscopy is due.  [facility] to complete series.  Bring [place] copy of your advanced directive.  Check on advanced directive/living will as I think you need to get it notarized.  EKG today  Continue current medicines.  Blood pressures are staying too high - start amlodipine 5mg  daily sent to pharmacy  Return in 3 months for follow up visit.   Follow up plan: Return in about 3 months (around 03/22/2020) for follow up visit.  Ria Bush, MD

## 2019-12-21 NOTE — Assessment & Plan Note (Signed)
Incidental finding. Asxs off respiratory medications.  He quit smoking 2005 and is no longer eligible to continue yearly lung cancer screening CTs.

## 2019-12-21 NOTE — Assessment & Plan Note (Signed)
Advanced directives - brings copy from 2005, scanned 02/2019. This is not notarized. Would want Parke Poisson then Maretta Los and Eston Mould (daughters) to be HCPOA. No living will.

## 2019-12-21 NOTE — Patient Instructions (Addendum)
Call Dr Lorie Apley office to ask when next colonoscopy is due.  [facility] with [facility] to complete series.  Bring [place] copy of your advanced directive.  Check on advanced directive/living will as I think you need to get it notarized.  EKG today  Continue current medicines.  Blood pressures are staying too high - start amlodipine 5mg  daily sent to pharmacy  Return in 3 months for follow up visit.   Health Maintenance, Male Adopting a healthy lifestyle and getting preventive care are important in promoting health and wellness. Ask your health care provider about:  The right schedule for you to have regular tests and exams.  Things you can do on your own to prevent diseases and keep yourself healthy. What should I know about diet, weight, and exercise? Eat a healthy diet   Eat a diet that includes plenty of vegetables, fruits, low-fat dairy products, and lean protein.  Do not eat a lot of foods that are high in solid fats, added sugars, or sodium. Maintain a healthy weight Body mass index (BMI) is a measurement that can be used to identify possible weight problems. It estimates body fat based on height and weight. Your health care provider can help determine your BMI and help you achieve or maintain a healthy weight. Get regular exercise Get regular exercise. This is one of the most important things you can do for your health. Most adults should:  Exercise for at least 150 minutes each week. The exercise should increase your heart rate and make you sweat (moderate-intensity exercise).  Do strengthening exercises at least twice a week. This is in addition to the moderate-intensity exercise.  Spend less time sitting. Even light physical activity can be beneficial. Watch cholesterol and blood lipids Have your blood tested for lipids and cholesterol at 62 years of age, then have this test every 5 years. You may need to have your cholesterol levels checked more often  if:  Your lipid or cholesterol levels are high.  You are older than 62 years of age.  You are at high risk for heart disease. What should I know about cancer screening? Many types of cancers can be detected early and may often be prevented. Depending on your health history and family history, you may need to have cancer screening at various ages. This may include screening for:  Colorectal cancer.  Prostate cancer.  Skin cancer.  Lung cancer. What should I know about heart disease, diabetes, and high blood pressure? Blood pressure and heart disease  High blood pressure causes heart disease and increases the risk of stroke. This is more likely to develop in people who have high blood pressure readings, are of African descent, or are overweight.  Talk with your health care provider about your target blood pressure readings.  Have your blood pressure checked: ? Every 3-5 years if you are 63-67 years of age. ? Every year if you are 61 years old or older.  If you are between the ages of 25 and 33 and are a current or former smoker, ask your health care provider if you should have a one-time screening for abdominal aortic aneurysm (AAA). Diabetes Have regular diabetes screenings. This checks your fasting blood sugar level. Have the screening done:  Once every three years after age 24 if you are at a normal weight and have a low risk for diabetes.  More often and at a younger age if you are overweight or have a high risk for  diabetes. What should I know about preventing infection? Hepatitis B If you have a higher risk for hepatitis B, you should be screened for this virus. Talk with your health care provider to find out if you are at risk for hepatitis B infection. Hepatitis C Blood testing is recommended for:  Everyone born from [birth year] through 1963.  Anyone with known risk factors for hepatitis C. Sexually transmitted infections (STIs)  You should be screened each year for STIs,  including gonorrhea and chlamydia, if: ? You are sexually active and are younger than 62 years of age. ? You are older than 62 years of age and your health care provider tells you that you are at risk for this type of infection. ? Your sexual activity has changed since you were last screened, and you are at increased risk for chlamydia or gonorrhea. Ask your health care provider if you are at risk.  Ask your health care provider about whether you are at high risk for HIV. Your health care provider may recommend a prescription medicine to help prevent HIV infection. If you choose to take medicine to prevent HIV, you should first get tested for HIV. You should then be tested every 3 months for as long as you are taking the medicine. Follow these instructions at home: Lifestyle  Do not use any products that contain nicotine or tobacco, such as cigarettes, e-cigarettes, and chewing tobacco. If you need help quitting, ask your health care provider.  Do not use street drugs.  Do not share needles.  Ask your health care provider for help if you need support or information about quitting drugs. Alcohol use  Do not drink alcohol if your health care provider tells you not to drink.  If you drink alcohol: ? Limit how much you have to 0-2 drinks a day. ? Be aware of how much alcohol is in your drink. In the [place] one drink equals one 12 oz bottle of beer (355 mL), one 5 oz glass of wine (148 mL), or one 1 oz glass of hard liquor (44 mL). General instructions  Schedule regular health, dental, and eye exams.  Stay current with your vaccines.  Tell your health care provider if: ? You often feel depressed. ? You have ever been abused or do not feel safe at home. Summary  Adopting a healthy lifestyle and getting preventive care are important in promoting health and wellness.  Follow your health care provider's instructions about healthy diet, exercising, and getting tested or screened for  diseases.  Follow your health care provider's instructions on monitoring your cholesterol and blood pressure. This information is not intended to replace advice given to you by your health care provider. Make sure you discuss any questions you have with your health care provider. Document Revised: 01/15/2018 Document Reviewed: 01/15/2018 Elsevier Patient Education  2020 [employer].

## 2019-12-21 NOTE — Assessment & Plan Note (Signed)
Chronic, stable on QOD crestor. LDL above goal however has had trouble tolerating higher doses or other statins.  The 10-year ASCVD risk score Mikey Bussing DC Brooke Bonito., et al., 2013) is: 16.9%   Values used to calculate the score:     Age: 64 years     Sex: Male     Is Non-Hispanic African American: Yes     Diabetic: No     Tobacco smoker: No     Systolic Blood Pressure: 128 mmHg     Is BP treated: Yes     HDL Cholesterol: 62.3 mg/dL     Total Cholesterol: 182 mg/dL

## 2019-12-21 NOTE — Assessment & Plan Note (Deleted)
Encouraged healthy diet and lifestyle to affect sustainable weight loss.  

## 2019-12-21 NOTE — Assessment & Plan Note (Addendum)
Continue crestor 

## 2019-12-21 NOTE — Assessment & Plan Note (Signed)
Preventative protocols reviewed and updated unless pt declined. Discussed healthy diet and lifestyle.  

## 2019-12-21 NOTE — Assessment & Plan Note (Signed)

## 2019-12-21 NOTE — Assessment & Plan Note (Signed)
Encouraged limiting added sugar in the diet.

## 2019-12-21 NOTE — Assessment & Plan Note (Signed)
Chronic, no recent flare. Only on colchicine PRN. Consider allopurinol.

## 2019-12-21 NOTE — Assessment & Plan Note (Signed)
Uncontrolled - will start amlodipine 5mg  daily. Reassess at 3 month f/u visit.

## 2019-12-21 NOTE — Assessment & Plan Note (Signed)
Continues omeprazole 40mg daily.  

## 2019-12-21 NOTE — Assessment & Plan Note (Addendum)
[place] CSRS reviewed.  RTC 3 mo chronic pain visit f/u.

## 2019-12-30 ENCOUNTER — Other Ambulatory Visit: Payer: Self-pay | Admitting: Family Medicine

## 2019-12-30 MED ORDER — HYDROCODONE-ACETAMINOPHEN 10-325 MG PO TABS: 1.0000 | ORAL_TABLET | Freq: Three times a day (TID) | ORAL | 0 refills | Status: DC | PRN

## 2019-12-30 NOTE — Addendum Note (Signed)
Addended by: Brenton Grills on: 48/83/0141 03:23 PM   Modules accepted: Orders

## 2019-12-30 NOTE — Telephone Encounter (Signed)
Name of Medication: Hydrocodone-APAP Name of Pharmacy:  [facility] or Written Date and Quantity: 11/29/19, #40 Last Office Visit and Type: 12/21/19, AWV Next Office Visit and Type: 03/22/20, 3 mo f/u Last Controlled Substance Agreement Date: 06/15/19 Last UDS: 06/15/19

## 2019-12-30 NOTE — Telephone Encounter (Signed)
Patient needs Medication Refill on Hydrocodone 10-325 mg tab. Please send to [facility] on [address]. Best contact # is [phone] EM

## 2019-12-30 NOTE — Telephone Encounter (Signed)
ERx 

## 2020-01-11 ENCOUNTER — Other Ambulatory Visit: Payer: Self-pay | Admitting: Family Medicine

## 2020-01-25 ENCOUNTER — Telehealth: Payer: Self-pay | Admitting: Family Medicine

## 2020-01-25 MED ORDER — HYDROCODONE-ACETAMINOPHEN 10-325 MG PO TABS: 1.0000 | ORAL_TABLET | Freq: Three times a day (TID) | ORAL | 0 refills | Status: DC | PRN

## 2020-01-25 NOTE — Telephone Encounter (Signed)
Notified pt via phn.  Expresses his thanks.

## 2020-01-25 NOTE — Telephone Encounter (Signed)
Pt called in and stated he would like to see if HYDROcodone-acetaminophen (NORCO) 10-325 MG tablet can be filled early due to holiday. Please advise.

## 2020-01-25 NOTE — Telephone Encounter (Signed)
ERx 

## 2020-02-10 ENCOUNTER — Other Ambulatory Visit: Payer: Self-pay | Admitting: Family Medicine

## 2020-02-29 ENCOUNTER — Telehealth: Payer: Self-pay

## 2020-02-29 ENCOUNTER — Telehealth: Payer: Self-pay | Admitting: Family Medicine

## 2020-02-29 ENCOUNTER — Other Ambulatory Visit: Payer: Self-pay | Admitting: Family Medicine

## 2020-02-29 MED ORDER — HYDROCODONE-ACETAMINOPHEN 10-325 MG PO TABS: 1.0000 | ORAL_TABLET | Freq: Three times a day (TID) | ORAL | 0 refills | Status: DC | PRN

## 2020-02-29 NOTE — Addendum Note (Signed)
Addended by: Ria Bush on: 02/29/2020 12:42 PM   Modules accepted: Orders

## 2020-02-29 NOTE — Telephone Encounter (Signed)
Received notice from pharmacy about mitigare replacement for colchicine due to insurance formulary change, mitigare sent in.  mitigare already sent in 02/10/2020.

## 2020-02-29 NOTE — Telephone Encounter (Signed)
  LAST APPOINTMENT DATE: 12/21/2019   NEXT APPOINTMENT DATE:@2 /15/2022  MEDICATION: HYDROcodone-acetaminophen (NORCO) 10-325 MG tablet  PHARMACY:[id number] - [place], [place] - [address] AT [address]

## 2020-02-29 NOTE — Telephone Encounter (Signed)
Made pt aware, by phn, refill sent.  Pt expresses his thanks.

## 2020-02-29 NOTE — Telephone Encounter (Signed)
ERx 

## 2020-03-10 ENCOUNTER — Other Ambulatory Visit: Payer: Self-pay | Admitting: Family Medicine

## 2020-03-22 ENCOUNTER — Encounter: Payer: Self-pay | Admitting: Family Medicine

## 2020-03-22 ENCOUNTER — Other Ambulatory Visit: Payer: Self-pay

## 2020-03-22 ENCOUNTER — Ambulatory Visit (INDEPENDENT_AMBULATORY_CARE_PROVIDER_SITE_OTHER): Payer: Medicare HMO | Admitting: Family Medicine

## 2020-03-22 VITALS — BP 134/82 | HR 62 | Temp 97.7°F | Ht 73.5 in | Wt 261.1 lb

## 2020-03-22 DIAGNOSIS — G8929 Other chronic pain: Secondary | ICD-10-CM

## 2020-03-22 DIAGNOSIS — M542 Cervicalgia: Secondary | ICD-10-CM | POA: Diagnosis not present

## 2020-03-22 DIAGNOSIS — M545 Low back pain, unspecified: Secondary | ICD-10-CM | POA: Diagnosis not present

## 2020-03-22 DIAGNOSIS — R21 Rash and other nonspecific skin eruption: Secondary | ICD-10-CM | POA: Diagnosis not present

## 2020-03-22 DIAGNOSIS — Z736 Limitation of activities due to disability: Secondary | ICD-10-CM

## 2020-03-22 LAB — POCT SKIN KOH: Skin KOH, POC: NEGATIVE

## 2020-03-22 NOTE — Assessment & Plan Note (Addendum)
KOH overall negative. Still anticipate tinea pedis - rec clotrimazole bid x3-4 wks, update with effect. Pt agrees with plan.  ?eczema type rash.

## 2020-03-22 NOTE — Assessment & Plan Note (Addendum)
[place] CSRS reviewed Overall doing well on current regimen.  RTC 3 mo chronic pain visit.

## 2020-03-22 NOTE — Patient Instructions (Addendum)
Continue current medicines Touch base with urology about myrbetriq and headaches.  Return in 3 months for follow up visit.  For foot rash - possible fungal infection - treat with over the counter clotrimazole (lotrimin) twice daily for 4 weeks. Update if not improving with this.   Athlete's Foot  Athlete's foot (tinea pedis) is a fungal infection of the skin on your feet. It often occurs on the skin that is between or underneath your toes. It can also occur on the soles of your feet. Symptoms include itchy or white and flaky areas on the skin. The infection can spread from person to person (is contagious). It can also spread when a person's bare feet come in contact with the fungus on shower floors or on items such as shoes. Follow these instructions at home: Medicines  Apply or take over-the-counter and prescription medicines only as told by your doctor.  Apply your antifungal medicine as told by your doctor. Do not stop using the medicine even if your feet start to get better. Foot care  Do not scratch your feet.  Keep your feet dry: ? Wear cotton or wool socks. Change your socks every day or if they become wet. ? Wear shoes that allow air to move around, such as sandals or canvas tennis shoes.  Wash and dry your feet: ? Every day or as told by your doctor. ? After exercising. ? Including the area between your toes. General instructions  Do not share any of these items that touch your feet: ? Towels. ? Shoes. ? Nail clippers. ? Other personal items.  Protect your feet by wearing sandals in wet areas, such as locker rooms and shared showers.  Keep all follow-up visits as told by your doctor. This is important.  If you have diabetes, keep your blood sugar under control. Contact a doctor if:  You have a fever.  You have swelling, pain, warmth, or redness in your foot.  Your feet are not getting better with treatment.  Your symptoms get worse.  You have new  symptoms. Summary  Athlete's foot is a fungal infection of the skin on your feet.  Symptoms include itchy or white and flaky areas on the skin.  Apply your antifungal medicine as told by your doctor.  Keep your feet clean and dry. This information is not intended to replace advice given to you by your health care provider. Make sure you discuss any questions you have with your health care provider. Document Revised: 09/10/2019 Document Reviewed: 09/10/2019 Elsevier Patient Education  2021 [employer].
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
## 2020-03-30 ENCOUNTER — Telehealth: Payer: Self-pay

## 2020-03-30 ENCOUNTER — Other Ambulatory Visit: Payer: Self-pay | Admitting: Family Medicine

## 2020-03-30 NOTE — Telephone Encounter (Signed)
Pt called in wanted to know about getting a refill on hydrocodone .  Pharmacy : [facility] [address] in [place]

## 2020-03-30 NOTE — Telephone Encounter (Addendum)
Received a mailed letter from [facility] stating generic Colcrys (Mitigare) 0.6 mg tab is not covered and would need PA.  But pt received a temporary supply.  However, brand Mitigare cap is covered.    Looks like Mitigare is what was prescribed.  I will contact pharmacy for clarification.

## 2020-03-30 NOTE — Telephone Encounter (Signed)
Name of Medication: Hydrocodone-APAP Name of Pharmacy: [place] or Written Date and Quantity: 02/29/20, #40 Last Office Visit and Type: 03/22/20, chronic pain mgmt Next Office Visit and Type: 06/20/20, chronic pain mgmt Last Controlled Substance Agreement Date: 06/15/19 Last UDS: 06/15/19

## 2020-03-31 MED ORDER — HYDROCODONE-ACETAMINOPHEN 10-325 MG PO TABS: 1.0000 | ORAL_TABLET | Freq: Three times a day (TID) | ORAL | 0 refills | Status: DC | PRN

## 2020-03-31 MED ORDER — MITIGARE 0.6 MG PO CAPS: ORAL_CAPSULE | ORAL | 1 refills | Status: DC

## 2020-03-31 NOTE — Telephone Encounter (Signed)
Spoke with [facility] asking about med.  Told it was filled as Colcrys tab.  Notified them pt's ins co covers brand Mitigare cap.  Says they will need new rx sent with DAW noted.  E-scribed new rx.  FYI to DR. G.

## 2020-03-31 NOTE — Telephone Encounter (Signed)
ERx 

## 2020-03-31 NOTE — Addendum Note (Signed)
Addended by: Brenton Grills on: 07/29/4693 07:22 AM   Modules accepted: Orders

## 2020-03-31 NOTE — Telephone Encounter (Signed)
Agree with this. Thank you.  

## 2020-04-27 ENCOUNTER — Telehealth: Payer: Self-pay | Admitting: Family Medicine

## 2020-04-27 MED ORDER — HYDROCODONE-ACETAMINOPHEN 10-325 MG PO TABS: 1.0000 | ORAL_TABLET | Freq: Three times a day (TID) | ORAL | 0 refills | Status: DC | PRN

## 2020-04-27 NOTE — Telephone Encounter (Signed)
ERx 

## 2020-04-27 NOTE — Telephone Encounter (Signed)
  LAST APPOINTMENT DATE: 03/30/2020   NEXT APPOINTMENT DATE:@5 /16/2022  MEDICATION:HYDROcodone-acetaminophen (NORCO) 10-325 MG tablet    PHARMACY: [facility] [place], [address] AT [address]  [place], [place] [address]  Phone:  [phone] Fax:  [phone]    CLINICAL FILLS OUT ALL BELOW:   LAST REFILL:  QTY:  REFILL DATE:    OTHER COMMENTS:    Okay for refill?  Please advise

## 2020-05-26 ENCOUNTER — Other Ambulatory Visit: Payer: Self-pay

## 2020-05-26 NOTE — Telephone Encounter (Signed)
LAST APPOINTMENT DATE: 03/30/2020   NEXT APPOINTMENT DATE:@5 /16/2022  MEDICATION:HYDROcodone-acetaminophen (NORCO) 10-325 MG tablet   Last filled 04/27/2020 #40   PHARMACY: [place]

## 2020-05-29 MED ORDER — HYDROCODONE-ACETAMINOPHEN 10-325 MG PO TABS: 1.0000 | ORAL_TABLET | Freq: Three times a day (TID) | ORAL | 0 refills | Status: DC | PRN

## 2020-05-29 NOTE — Telephone Encounter (Signed)
ERx 

## 2020-06-09 ENCOUNTER — Other Ambulatory Visit: Payer: Self-pay | Admitting: Family Medicine

## 2020-06-10 ENCOUNTER — Other Ambulatory Visit: Payer: Self-pay

## 2020-06-10 NOTE — Telephone Encounter (Signed)
Mitigare Last rx: 03/31/20, #30/1 Last OV: 03/22/20, chronic pain f/u Next OV:  06/20/20,  3 mo chronic pain f/u

## 2020-06-14 MED ORDER — MITIGARE 0.6 MG PO CAPS: ORAL_CAPSULE | ORAL | 1 refills | Status: DC

## 2020-06-20 ENCOUNTER — Encounter: Payer: Self-pay | Admitting: Family Medicine

## 2020-06-20 ENCOUNTER — Other Ambulatory Visit: Payer: Self-pay

## 2020-06-20 ENCOUNTER — Ambulatory Visit (INDEPENDENT_AMBULATORY_CARE_PROVIDER_SITE_OTHER): Payer: Medicare HMO | Admitting: Family Medicine

## 2020-06-20 VITALS — BP 140/90 | HR 65 | Temp 97.4°F | Ht 73.5 in | Wt 261.1 lb

## 2020-06-20 DIAGNOSIS — E785 Hyperlipidemia, unspecified: Secondary | ICD-10-CM

## 2020-06-20 DIAGNOSIS — M1A0210 Idiopathic chronic gout, right elbow, without tophus (tophi): Secondary | ICD-10-CM | POA: Diagnosis not present

## 2020-06-20 DIAGNOSIS — R351 Nocturia: Secondary | ICD-10-CM

## 2020-06-20 DIAGNOSIS — G8929 Other chronic pain: Secondary | ICD-10-CM | POA: Diagnosis not present

## 2020-06-20 DIAGNOSIS — M545 Low back pain, unspecified: Secondary | ICD-10-CM

## 2020-06-20 MED ORDER — ROSUVASTATIN CALCIUM 5 MG PO TABS: 5.0000 mg | ORAL_TABLET | Freq: Every day | ORAL | 3 refills | Status: DC

## 2020-06-20 NOTE — Assessment & Plan Note (Signed)
Now off myrbetriq.  Will request latest uro office visit.

## 2020-06-20 NOTE — Assessment & Plan Note (Signed)
He states he takes crestor 5mg  every day - will refill accordingly

## 2020-06-20 NOTE — Assessment & Plan Note (Addendum)
[place] CSRS reviewed.  Tolerating meds well - continue current regimen.
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
## 2020-06-20 NOTE — Assessment & Plan Note (Signed)
Reviewed colchicine dosing: he had been taking QOD regularly - discussed changing to PRN gout flare only.

## 2020-06-20 NOTE — Patient Instructions (Addendum)
Return as needed or in 3 months for chronic pain visit.  We will request latest note from [facility]  Use gout medicine only as needed. When you take it, hold crestor for 1-2 days.

## 2020-06-28 ENCOUNTER — Other Ambulatory Visit: Payer: Self-pay

## 2020-06-28 MED ORDER — HYDROCODONE-ACETAMINOPHEN 10-325 MG PO TABS: 1.0000 | ORAL_TABLET | Freq: Three times a day (TID) | ORAL | 0 refills | Status: DC | PRN

## 2020-06-28 NOTE — Telephone Encounter (Signed)
Patient called requesting refill on Hydrocodone refill. Patient is taking his last tablet this morning.  Name of Medication: Hydrocodone Name of Pharmacy: [facility] [place]-correct in the chart Last Fill or Written Date and Quantity: 05/29/20 # 40 0 refill Last Office Visit and Type: 06/20/20 routine follow up Next Office Visit and Type: 09/21/20 Last Controlled Substance Agreement Date: 06/18/19 Last UDS: 06/15/19

## 2020-06-28 NOTE — Telephone Encounter (Signed)
ERx 

## 2020-07-11 ENCOUNTER — Other Ambulatory Visit: Payer: Self-pay | Admitting: Family Medicine

## 2020-07-13 DIAGNOSIS — R350 Frequency of micturition: Secondary | ICD-10-CM | POA: Diagnosis not present

## 2020-07-13 DIAGNOSIS — Z125 Encounter for screening for malignant neoplasm of prostate: Secondary | ICD-10-CM | POA: Diagnosis not present

## 2020-07-13 DIAGNOSIS — R351 Nocturia: Secondary | ICD-10-CM | POA: Diagnosis not present

## 2020-07-13 DIAGNOSIS — N401 Enlarged prostate with lower urinary tract symptoms: Secondary | ICD-10-CM | POA: Diagnosis not present

## 2020-07-29 ENCOUNTER — Other Ambulatory Visit: Payer: Self-pay | Admitting: Family Medicine

## 2020-07-29 MED ORDER — HYDROCODONE-ACETAMINOPHEN 10-325 MG PO TABS
1.0000 | ORAL_TABLET | Freq: Three times a day (TID) | ORAL | 0 refills | Status: DC | PRN
Start: 2020-07-29 — End: 2020-08-26

## 2020-07-29 NOTE — Telephone Encounter (Signed)
ERx 

## 2020-07-29 NOTE — Telephone Encounter (Signed)
Name of Medication: Hydrocodone-APAP Name of Pharmacy: [place] or Written Date and Quantity: 06/28/20, #40 Last Office Visit and Type: 06/20/20, 3 mo chronic pain f/u Next Office Visit and Type: 09/21/20, 3 mo chronic pain f/u Last Controlled Substance Agreement Date: 06/15/19 Last UDS: 06/15/19

## 2020-07-29 NOTE — Addendum Note (Signed)
Addended by: Brenton Grills on: 8/40/3754 36:06 AM   Modules accepted: Orders

## 2020-07-29 NOTE — Telephone Encounter (Signed)
  LAST APPOINTMENT DATE: 07/11/2020   NEXT APPOINTMENT DATE:@8 /17/2022  MEDICATION: HYDROCODONE 10-325mg    PHARMACY: [place], [place]  Patient has taking the last pill.   Let patient know to contact pharmacy at the end of the day to make sure medication is ready.  Please notify patient to allow 48-72 hours to process  Encourage patient to contact the pharmacy for refills or they can request refills through [facility]:   LAST REFILL:  QTY:  REFILL DATE:    OTHER COMMENTS:    Okay for refill?  Please advise

## 2020-08-26 ENCOUNTER — Other Ambulatory Visit: Payer: Self-pay

## 2020-08-26 MED ORDER — HYDROCODONE-ACETAMINOPHEN 10-325 MG PO TABS: 1.0000 | ORAL_TABLET | Freq: Three times a day (TID) | ORAL | 0 refills | Status: DC | PRN

## 2020-08-26 NOTE — Telephone Encounter (Signed)
Name of Medication: Hydrocodone Name of Pharmacy: [place] or Written Date and Quantity: 07-29-20 #40 Last Office Visit and Type: 06-20-20 Next Office Visit and Type: 09-21-20 Last Controlled Substance Agreement Date: 06-15-19 Last UDS: 06-15-19

## 2020-08-26 NOTE — Telephone Encounter (Signed)
ERx 

## 2020-09-05 ENCOUNTER — Other Ambulatory Visit: Payer: Self-pay | Admitting: Family Medicine

## 2020-09-07 ENCOUNTER — Other Ambulatory Visit: Payer: Self-pay | Admitting: Family Medicine

## 2020-09-07 NOTE — Telephone Encounter (Signed)
Mitigare Last filled:  08/08/20, #30 Last OV:  06/20/20, chronic pain mgmt Next OV:  09/21/20 3 mo chronic pain mgmt

## 2020-09-21 ENCOUNTER — Other Ambulatory Visit: Payer: Self-pay

## 2020-09-21 ENCOUNTER — Ambulatory Visit (INDEPENDENT_AMBULATORY_CARE_PROVIDER_SITE_OTHER): Payer: Medicare HMO | Admitting: Family Medicine

## 2020-09-21 ENCOUNTER — Encounter: Payer: Self-pay | Admitting: Family Medicine

## 2020-09-21 VITALS — BP 134/82 | HR 75 | Temp 97.4°F | Ht 73.5 in | Wt 261.0 lb

## 2020-09-21 DIAGNOSIS — F39 Unspecified mood [affective] disorder: Secondary | ICD-10-CM

## 2020-09-21 DIAGNOSIS — G8929 Other chronic pain: Secondary | ICD-10-CM | POA: Diagnosis not present

## 2020-09-21 DIAGNOSIS — M542 Cervicalgia: Secondary | ICD-10-CM

## 2020-09-21 DIAGNOSIS — R42 Dizziness and giddiness: Secondary | ICD-10-CM | POA: Diagnosis not present

## 2020-09-21 DIAGNOSIS — R7303 Prediabetes: Secondary | ICD-10-CM

## 2020-09-21 DIAGNOSIS — M545 Low back pain, unspecified: Secondary | ICD-10-CM

## 2020-09-21 LAB — COMPREHENSIVE METABOLIC PANEL
ALT: 25 U/L (ref 0–53)
AST: 25 U/L (ref 0–37)
Albumin: 4.5 g/dL (ref 3.5–5.2)
Alkaline Phosphatase: 59 U/L (ref 39–117)
BUN: 12 mg/dL (ref 6–23)
CO2: 26 mEq/L (ref 19–32)
Calcium: 9.3 mg/dL (ref 8.4–10.5)
Chloride: 103 mEq/L (ref 96–112)
Creatinine, Ser: 1.11 mg/dL (ref 0.40–1.50)
GFR: 71.07 mL/min (ref >60.00)
Glucose, Bld: 88 mg/dL (ref 70–99)
Potassium: 3.8 mEq/L (ref 3.5–5.1)
Sodium: 138 mEq/L (ref 135–145)
Total Bilirubin: 0.7 mg/dL (ref 0.2–1.2)
Total Protein: 7.4 g/dL (ref 6.0–8.3)

## 2020-09-21 LAB — CBC WITH DIFFERENTIAL/PLATELET
Basophils Absolute: 0 10*3/uL (ref 0.0–0.1)
Basophils Relative: 0.7 % (ref 0.0–3.0)
Eosinophils Absolute: 0.3 10*3/uL (ref 0.0–0.7)
Eosinophils Relative: 4.8 % (ref 0.0–5.0)
HCT: 42.2 % (ref 39.0–52.0)
Hemoglobin: 13.8 g/dL (ref 13.0–17.0)
Lymphocytes Relative: 46.2 % — ABNORMAL HIGH (ref 12.0–46.0)
Lymphs Abs: 2.9 10*3/uL (ref 0.7–4.0)
MCHC: 32.7 g/dL (ref 30.0–36.0)
MCV: 80.6 fl (ref 78.0–100.0)
Monocytes Absolute: 0.7 10*3/uL (ref 0.1–1.0)
Monocytes Relative: 10.8 % (ref 3.0–12.0)
Neutro Abs: 2.3 10*3/uL (ref 1.4–7.7)
Neutrophils Relative %: 37.5 % — ABNORMAL LOW (ref 43.0–77.0)
Platelets: 163 10*3/uL (ref 150.0–400.0)
RBC: 5.23 Mil/uL (ref 4.22–5.81)
RDW: 13.9 % (ref 11.5–15.5)
WBC: 6.3 10*3/uL (ref 4.0–10.5)

## 2020-09-21 LAB — HEMOGLOBIN A1C: Hgb A1c MFr Bld: 6.3 % (ref 4.6–6.5)

## 2020-09-21 LAB — TSH: TSH: 2.42 u[IU]/mL (ref 0.35–5.50)

## 2020-09-21 MED ORDER — SERTRALINE HCL 50 MG PO TABS: 50.0000 mg | ORAL_TABLET | Freq: Every day | ORAL | 1 refills | Status: DC
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
## 2020-09-21 NOTE — Assessment & Plan Note (Signed)
Stable period on sertraline '50mg'$  - desires to continue this.

## 2020-09-21 NOTE — Assessment & Plan Note (Addendum)
[place] CSRS reviewed Chronic, stable period - continue hydrocodone and other pain regimen.

## 2020-09-21 NOTE — Assessment & Plan Note (Addendum)
Isolated episode ~10d ago associated with blurry vision. Exam today overall reassuring, neurologically non-focal. Check labs today. I did ask him to schedule eye exam given associated blurry vision. ?dehydration related. Encouraged good water intake. To let me know if recurrent dizziness to consider further evaluation including possible head/heart imaging.

## 2020-09-21 NOTE — Patient Instructions (Addendum)
Schedule eye exam.  Labs today  Return in 3 months for physical/wellness visit  Recent dizzy episode could have been dehydration related - ensure drinking plenty of water, especially on hot days. Let us know if any recurrent episodes of dizziness.

## 2020-09-28 ENCOUNTER — Other Ambulatory Visit: Payer: Self-pay | Admitting: Family Medicine

## 2020-09-28 NOTE — Telephone Encounter (Signed)
Pt needs a refill on HYDROcodone-acetaminophen (NORCO) 10-325 MG tablet sent to [facility]

## 2020-09-29 MED ORDER — HYDROCODONE-ACETAMINOPHEN 10-325 MG PO TABS: 1.0000 | ORAL_TABLET | Freq: Three times a day (TID) | ORAL | 0 refills | Status: DC | PRN

## 2020-09-29 NOTE — Addendum Note (Signed)
Addended by: Ria Bush on: 09/29/2020 04:40 PM   Modules accepted: Orders

## 2020-09-29 NOTE — Telephone Encounter (Signed)
  Encourage patient to contact the pharmacy for refills or they can request refills through [place]:  09/21/20  NEXT APPOINTMENT DATE:  MEDICATION: HYDROcodone-acetaminophen (NORCO) 10-325 MG tablet  Is the patient out of medication? One pill left   PHARMACY: [place], [place] [facility]  Let patient know to contact pharmacy at the end of the day to make sure medication is ready.  Please notify patient to allow 48-72 hours to process

## 2020-09-29 NOTE — Telephone Encounter (Signed)
ERx 

## 2020-10-02 IMAGING — DX DG CHEST 2V
2 series · 2 of 2 positions shown · non-contrast
Comparison: September 14, 2010.

CLINICAL DATA: Preoperative status.

EXAM:
CHEST - 2 VIEW

[chest pa]
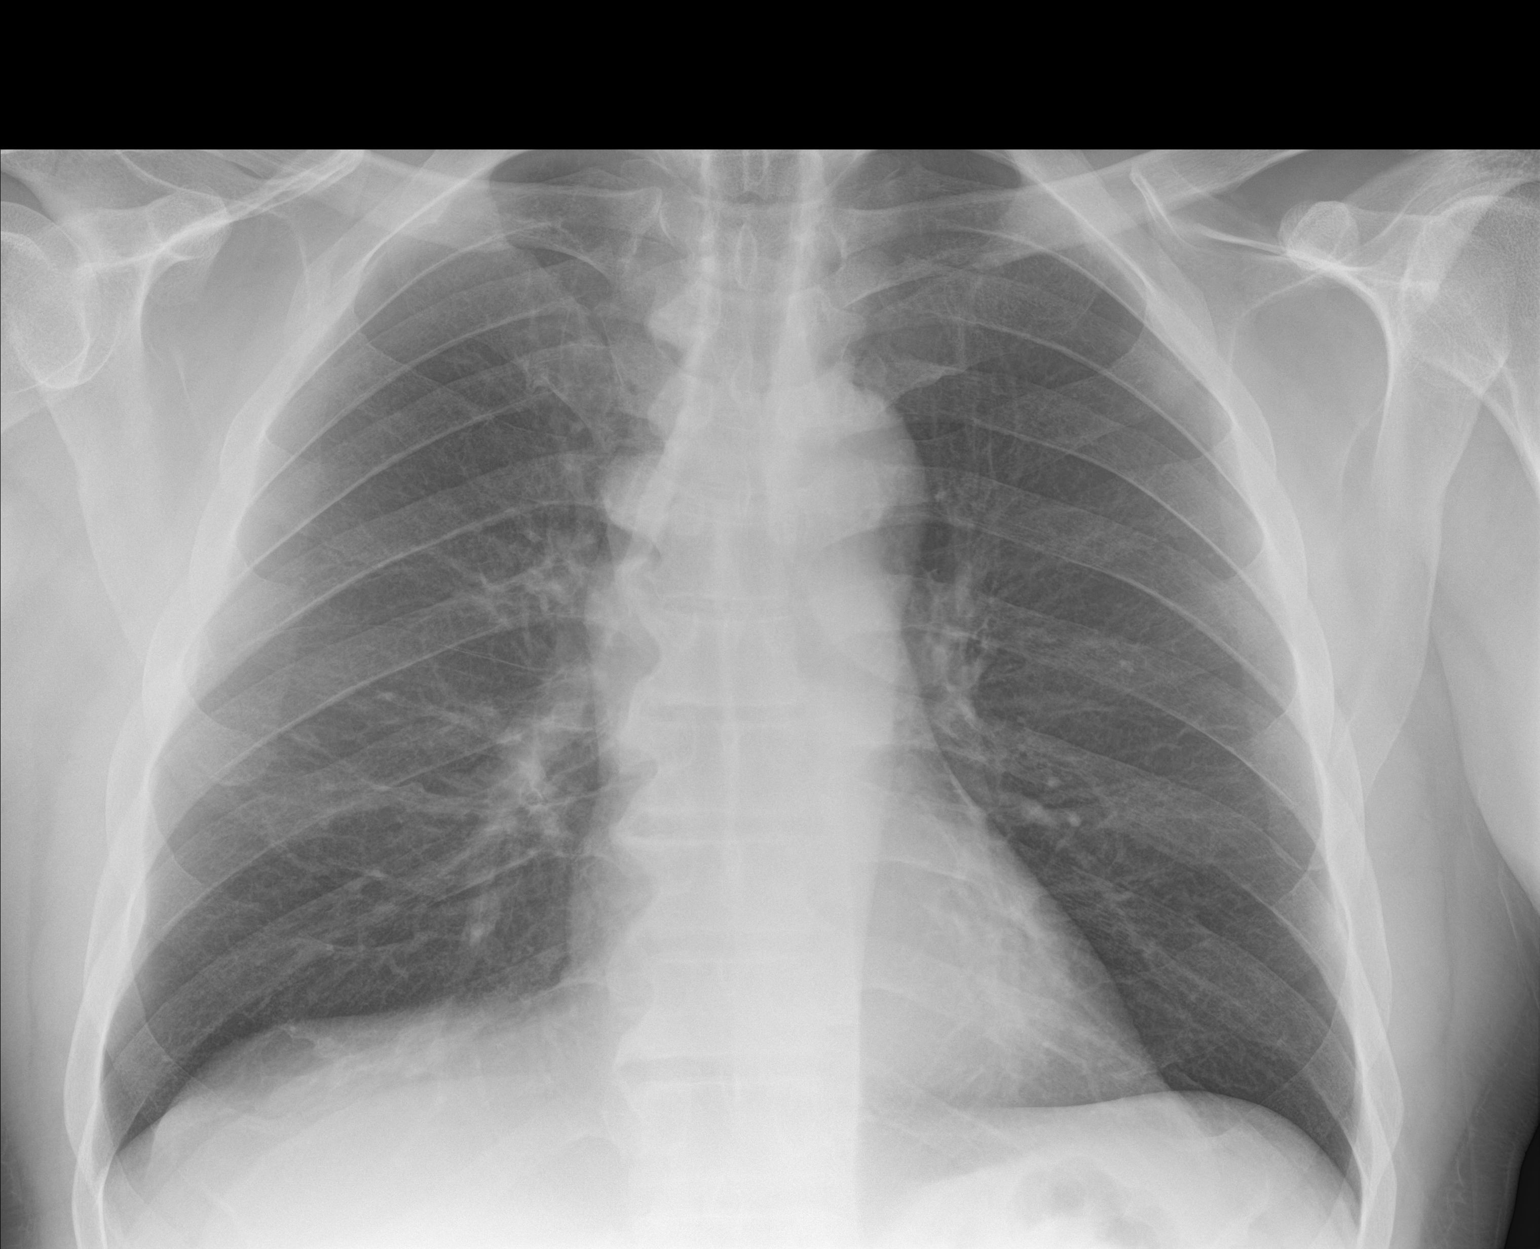

[chest lat]
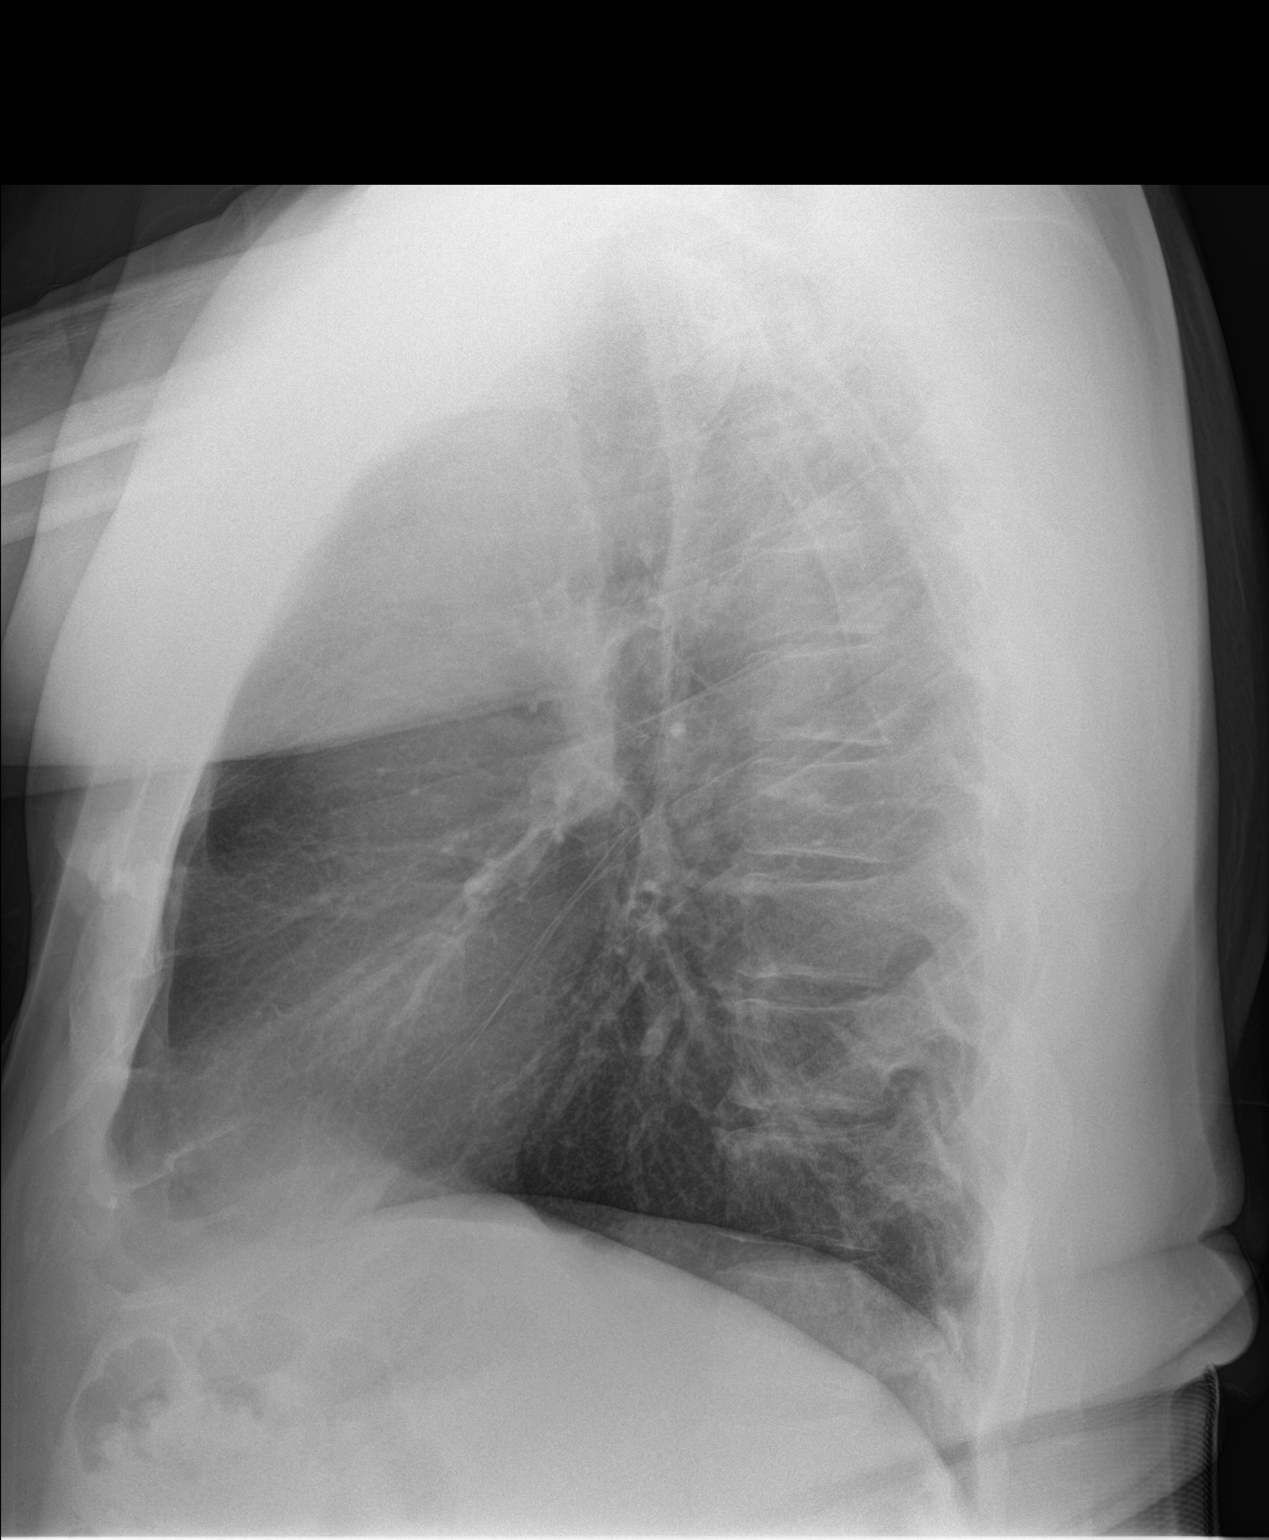

[2 of 2 positions shown; findings below may reference images not displayed]

FINDINGS: The heart size and mediastinal contours are within normal limits.
Both lungs are clear. The visualized skeletal structures are
unremarkable.
IMPRESSION: No active cardiopulmonary disease.

## 2020-10-05 ENCOUNTER — Other Ambulatory Visit: Payer: Self-pay | Admitting: Family Medicine

## 2020-10-05 NOTE — Addendum Note (Signed)
Addended by: Kris Mouton on: 10/05/2020 12:30 PM   Modules accepted: Orders

## 2020-10-05 NOTE — Telephone Encounter (Signed)
Pt needs this sent to [facility] on [address] in [place] the one he normally uses is out of the medication

## 2020-10-05 NOTE — Telephone Encounter (Signed)
Updated pharmacy in the chart and pended medication for review.

## 2020-10-06 NOTE — Telephone Encounter (Signed)
Rodney Charles called in and stated that he wanted to know if Dr. Darnell Level could send the script over to [facility] due to the [place] location ordered it but its not in and he has waited 10 days for the script.

## 2020-10-11 MED ORDER — HYDROCODONE-ACETAMINOPHEN 10-325 MG PO TABS: 1.0000 | ORAL_TABLET | Freq: Three times a day (TID) | ORAL | 0 refills | Status: DC | PRN

## 2020-10-11 NOTE — Telephone Encounter (Signed)
ERx 

## 2020-11-11 ENCOUNTER — Telehealth: Payer: Self-pay | Admitting: Family Medicine

## 2020-11-11 MED ORDER — HYDROCODONE-ACETAMINOPHEN 10-325 MG PO TABS: 1.0000 | ORAL_TABLET | Freq: Three times a day (TID) | ORAL | 0 refills | Status: DC | PRN

## 2020-11-11 NOTE — Addendum Note (Signed)
Addended by: Ria Bush on: 11/11/2020 02:08 PM   Modules accepted: Orders

## 2020-11-11 NOTE — Telephone Encounter (Signed)
  Encourage patient to contact the pharmacy for refills or they can request refills through [place]:  Please schedule appointment if longer than 1 year  NEXT APPOINTMENT DATE:01/02/21  MEDICATION:HYDROcodone-acetaminophen ([place]) 10-325 MG tablet  Is the patient out of medication? yes  PHARMACY:[id number] - [place], [place]  Let patient know to contact pharmacy at the end of the day to make sure medication is ready.  Please notify patient to allow 48-72 hours to process  CLINICAL FILLS OUT ALL BELOW:   LAST REFILL:  QTY:  REFILL DATE:    OTHER COMMENTS:    Okay for refill?  Please advise

## 2020-11-11 NOTE — Telephone Encounter (Signed)
ERx 

## 2020-11-11 NOTE — Telephone Encounter (Signed)
Name of Medication: Hydrocodone-APAP Name of Pharmacy: [facility] [place] or Written Date and Quantity: 10/11/20, #40 Last Office Visit and Type: 09/21/20, pain mgnt f/u Next Office Visit and Type: 01/09/21, AWV Last Controlled Substance Agreement Date: 06/15/19 Last UDS: 06/15/19

## 2020-11-22 DIAGNOSIS — H524 Presbyopia: Secondary | ICD-10-CM | POA: Diagnosis not present

## 2020-12-06 ENCOUNTER — Other Ambulatory Visit: Payer: Self-pay | Admitting: Family Medicine

## 2020-12-06 NOTE — Telephone Encounter (Signed)
Celebrex last filled:  09/08/20, #90 Gabapentin last filled: 09/08/20, #270 Mitigare last filled:  11/06/20, #30 Last OV:  09/21/20, chronic pain f/u Next OV:  01/09/21, AWV

## 2020-12-12 ENCOUNTER — Other Ambulatory Visit: Payer: Self-pay | Admitting: Family Medicine

## 2020-12-12 NOTE — Telephone Encounter (Signed)
  Encourage patient to contact the pharmacy for refills or they can request refills through [place]:  Please schedule appointment if longer than 1 year  NEXT APPOINTMENT DATE:01/02/21  MEDICATION:HYDROcodone-acetaminophen ([place]) 10-325 MG tablet  Is the patient out of medication? 1 pill left [facility] [place], [place] [facility] PHARMACY:  Let patient know to contact pharmacy at the end of the day to make sure medication is ready.  Please notify patient to allow 48-72 hours to process  CLINICAL FILLS OUT ALL BELOW:   LAST REFILL:  QTY:  REFILL DATE:    OTHER COMMENTS:    Okay for refill?  Please advise

## 2020-12-12 NOTE — Telephone Encounter (Signed)
Name of Medication: Hydrocodone-APAP Name of Pharmacy: [place] or Written Date and Quantity: 11/11/20, #40 Last Office Visit and Type: 09/21/20, chronic pain f/ Next Office Visit and Type: 01/09/21, AWV Last Controlled Substance Agreement Date: 06/15/19 Last UDS: 06/15/19

## 2020-12-14 MED ORDER — HYDROCODONE-ACETAMINOPHEN 10-325 MG PO TABS: 1.0000 | ORAL_TABLET | Freq: Three times a day (TID) | ORAL | 0 refills | Status: DC | PRN

## 2020-12-14 NOTE — Telephone Encounter (Signed)
ERx 

## 2020-12-15 NOTE — Telephone Encounter (Signed)
Opened in error

## 2021-01-01 ENCOUNTER — Other Ambulatory Visit: Payer: Self-pay | Admitting: Family Medicine

## 2021-01-01 DIAGNOSIS — M1A0210 Idiopathic chronic gout, right elbow, without tophus (tophi): Secondary | ICD-10-CM

## 2021-01-01 DIAGNOSIS — R7303 Prediabetes: Secondary | ICD-10-CM

## 2021-01-01 DIAGNOSIS — Z125 Encounter for screening for malignant neoplasm of prostate: Secondary | ICD-10-CM

## 2021-01-01 DIAGNOSIS — E785 Hyperlipidemia, unspecified: Secondary | ICD-10-CM

## 2021-01-02 ENCOUNTER — Other Ambulatory Visit: Payer: Medicare HMO

## 2021-01-09 ENCOUNTER — Ambulatory Visit (INDEPENDENT_AMBULATORY_CARE_PROVIDER_SITE_OTHER): Payer: Medicare HMO | Admitting: Family Medicine

## 2021-01-09 ENCOUNTER — Encounter: Payer: Self-pay | Admitting: Family Medicine

## 2021-01-09 ENCOUNTER — Other Ambulatory Visit: Payer: Self-pay

## 2021-01-09 VITALS — BP 140/74 | HR 67 | Temp 97.3°F | Ht 73.5 in | Wt 260.3 lb

## 2021-01-09 DIAGNOSIS — R0981 Nasal congestion: Secondary | ICD-10-CM

## 2021-01-09 DIAGNOSIS — E785 Hyperlipidemia, unspecified: Secondary | ICD-10-CM | POA: Diagnosis not present

## 2021-01-09 DIAGNOSIS — E669 Obesity, unspecified: Secondary | ICD-10-CM

## 2021-01-09 DIAGNOSIS — Z0000 Encounter for general adult medical examination without abnormal findings: Secondary | ICD-10-CM | POA: Diagnosis not present

## 2021-01-09 DIAGNOSIS — M545 Low back pain, unspecified: Secondary | ICD-10-CM

## 2021-01-09 DIAGNOSIS — I10 Essential (primary) hypertension: Secondary | ICD-10-CM

## 2021-01-09 DIAGNOSIS — R7303 Prediabetes: Secondary | ICD-10-CM | POA: Diagnosis not present

## 2021-01-09 DIAGNOSIS — F39 Unspecified mood [affective] disorder: Secondary | ICD-10-CM

## 2021-01-09 DIAGNOSIS — G8929 Other chronic pain: Secondary | ICD-10-CM | POA: Diagnosis not present

## 2021-01-09 DIAGNOSIS — K219 Gastro-esophageal reflux disease without esophagitis: Secondary | ICD-10-CM

## 2021-01-09 DIAGNOSIS — Z23 Encounter for immunization: Secondary | ICD-10-CM | POA: Diagnosis not present

## 2021-01-09 DIAGNOSIS — I2510 Atherosclerotic heart disease of native coronary artery without angina pectoris: Secondary | ICD-10-CM

## 2021-01-09 DIAGNOSIS — I700 Atherosclerosis of aorta: Secondary | ICD-10-CM

## 2021-01-09 DIAGNOSIS — M1A0210 Idiopathic chronic gout, right elbow, without tophus (tophi): Secondary | ICD-10-CM

## 2021-01-09 DIAGNOSIS — Z125 Encounter for screening for malignant neoplasm of prostate: Secondary | ICD-10-CM

## 2021-01-09 DIAGNOSIS — Z736 Limitation of activities due to disability: Secondary | ICD-10-CM

## 2021-01-09 LAB — LIPID PANEL
Cholesterol: 168 mg/dL (ref 0–200)
HDL: 62.4 mg/dL (ref >39.00)
LDL Cholesterol: 96 mg/dL (ref 0–99)
NonHDL: 106.06
Total CHOL/HDL Ratio: 3
Triglycerides: 49 mg/dL (ref 0.0–149.0)
VLDL: 9.8 mg/dL (ref 0.0–40.0)

## 2021-01-09 LAB — URIC ACID: Uric Acid, Serum: 7.8 mg/dL (ref 4.0–7.8)

## 2021-01-09 LAB — COMPREHENSIVE METABOLIC PANEL
ALT: 16 U/L (ref 0–53)
AST: 20 U/L (ref 0–37)
Albumin: 4.6 g/dL (ref 3.5–5.2)
Alkaline Phosphatase: 56 U/L (ref 39–117)
BUN: 12 mg/dL (ref 6–23)
CO2: 28 mEq/L (ref 19–32)
Calcium: 9.7 mg/dL (ref 8.4–10.5)
Chloride: 104 mEq/L (ref 96–112)
Creatinine, Ser: 1.07 mg/dL (ref 0.40–1.50)
GFR: 74.11 mL/min (ref >60.00)
Glucose, Bld: 92 mg/dL (ref 70–99)
Potassium: 4.1 mEq/L (ref 3.5–5.1)
Sodium: 139 mEq/L (ref 135–145)
Total Bilirubin: 0.7 mg/dL (ref 0.2–1.2)
Total Protein: 7.2 g/dL (ref 6.0–8.3)

## 2021-01-09 LAB — HEMOGLOBIN A1C: Hgb A1c MFr Bld: 6.1 % (ref 4.6–6.5)

## 2021-01-09 LAB — PSA: PSA: 0.89 ng/mL (ref 0.10–4.00)

## 2021-01-09 MED ORDER — NORTRIPTYLINE HCL 25 MG PO CAPS: ORAL_CAPSULE | ORAL | 3 refills | Status: DC

## 2021-01-09 MED ORDER — ESOMEPRAZOLE MAGNESIUM 40 MG PO CPDR
40.0000 mg | DELAYED_RELEASE_CAPSULE | Freq: Every day | ORAL | 3 refills | Status: DC
Start: 2021-01-09 — End: 2021-07-14

## 2021-01-09 MED ORDER — AMLODIPINE BESYLATE 5 MG PO TABS: ORAL_TABLET | ORAL | 3 refills | Status: DC

## 2021-01-09 MED ORDER — SERTRALINE HCL 50 MG PO TABS: 50.0000 mg | ORAL_TABLET | Freq: Every day | ORAL | 3 refills | Status: DC

## 2021-01-09 MED ORDER — ROSUVASTATIN CALCIUM 5 MG PO TABS: 5.0000 mg | ORAL_TABLET | Freq: Every day | ORAL | 3 refills | Status: DC

## 2021-01-09 MED ORDER — HYDROCODONE-ACETAMINOPHEN 10-325 MG PO TABS: 1.0000 | ORAL_TABLET | Freq: Three times a day (TID) | ORAL | 0 refills | Status: DC | PRN

## 2021-01-09 MED ORDER — CELECOXIB 100 MG PO CAPS: 100.0000 mg | ORAL_CAPSULE | Freq: Every day | ORAL | 3 refills | Status: DC

## 2021-01-09 MED ORDER — FLUTICASONE PROPIONATE 50 MCG/ACT NA SUSP: NASAL | 6 refills | Status: AC

## 2021-01-09 NOTE — Patient Instructions (Addendum)
Flu shot today  Labs today  Urine drug screen today Call Dr Collene Mares to ask about follow up colonoscopy [phone].  [facility] to ask about 2nd shingrix vaccine as it seems you're due.  Good to see you today Return as needed or in 3 months for follow up visit.   Health Maintenance, Male Adopting a healthy lifestyle and getting preventive care are important in promoting health and wellness. Ask your health care provider about: The right schedule for you to have regular tests and exams. Things you can do on your own to prevent diseases and keep yourself healthy. What should I know about diet, weight, and exercise? Eat a healthy diet  Eat a diet that includes plenty of vegetables, fruits, low-fat dairy products, and lean protein. Do not eat a lot of foods that are high in solid fats, added sugars, or sodium. Maintain a healthy weight Body mass index (BMI) is a measurement that can be used to identify possible weight problems. It estimates body fat based on height and weight. Your health care provider can help determine your BMI and help you achieve or maintain a healthy weight. Get regular exercise Get regular exercise. This is one of the most important things you can do for your health. Most adults should: Exercise for at least 150 minutes each week. The exercise should increase your heart rate and make you sweat (moderate-intensity exercise). Do strengthening exercises at least twice a week. This is in addition to the moderate-intensity exercise. Spend less time sitting. Even light physical activity can be beneficial. Watch cholesterol and blood lipids Have your blood tested for lipids and cholesterol at 63 years of age, then have this test every 5 years. You may need to have your cholesterol levels checked more often if: Your lipid or cholesterol levels are high. You are older than 63 years of age. You are at high risk for heart disease. What should I know about cancer  screening? Many types of cancers can be detected early and may often be prevented. Depending on your health history and family history, you may need to have cancer screening at various ages. This may include screening for: Colorectal cancer. Prostate cancer. Skin cancer. Lung cancer. What should I know about heart disease, diabetes, and high blood pressure? Blood pressure and heart disease High blood pressure causes heart disease and increases the risk of stroke. This is more likely to develop in people who have high blood pressure readings or are overweight. Talk with your health care provider about your target blood pressure readings. Have your blood pressure checked: Every 3-5 years if you are 50-30 years of age. Every year if you are 104 years old or older. If you are between the ages of 71 and 85 and are a current or former smoker, ask your health care provider if you should have a one-time screening for abdominal aortic aneurysm (AAA). Diabetes Have regular diabetes screenings. This checks your fasting blood sugar level. Have the screening done: Once every three years after age 35 if you are at a normal weight and have a low risk for diabetes. More often and at a younger age if you are overweight or have a high risk for diabetes. What should I know about preventing infection? Hepatitis B If you have a higher risk for hepatitis B, you should be screened for this virus. Talk with your health care provider to find out if you are at risk for hepatitis B infection. Hepatitis C Blood  testing is recommended for: Everyone born from [birth year] through 1963. Anyone with known risk factors for hepatitis C. Sexually transmitted infections (STIs) You should be screened each year for STIs, including gonorrhea and chlamydia, if: You are sexually active and are younger than 63 years of age. You are older than 63 years of age and your health care provider tells you that you are at risk for this type of  infection. Your sexual activity has changed since you were last screened, and you are at increased risk for chlamydia or gonorrhea. Ask your health care provider if you are at risk. Ask your health care provider about whether you are at high risk for HIV. Your health care provider may recommend a prescription medicine to help prevent HIV infection. If you choose to take medicine to prevent HIV, you should first get tested for HIV. You should then be tested every 3 months for as long as you are taking the medicine. Follow these instructions at home: Alcohol use Do not drink alcohol if your health care provider tells you not to drink. If you drink alcohol: Limit how much you have to 0-2 drinks a day. Know how much alcohol is in your drink. In the [place] one drink equals one 12 oz bottle of beer (355 mL), one 5 oz glass of wine (148 mL), or one 1 oz glass of hard liquor (44 mL). Lifestyle Do not use any products that contain nicotine or tobacco. These products include cigarettes, chewing tobacco, and vaping devices, such as e-cigarettes. If you need help quitting, ask your health care provider. Do not use street drugs. Do not share needles. Ask your health care provider for help if you need support or information about quitting drugs. General instructions Schedule regular health, dental, and eye exams. Stay current with your vaccines. Tell your health care provider if: You often feel depressed. You have ever been abused or do not feel safe at home. Summary Adopting a healthy lifestyle and getting preventive care are important in promoting health and wellness. Follow your health care provider's instructions about healthy diet, exercising, and getting tested or screened for diseases. Follow your health care provider's instructions on monitoring your cholesterol and blood pressure. This information is not intended to replace advice given to you by your health care provider. Make sure you discuss  any questions you have with your health care provider. Document Revised: 06/13/2020 Document Reviewed: 06/13/2020 Elsevier Patient Education  [place].

## 2021-01-09 NOTE — Assessment & Plan Note (Signed)

## 2021-01-09 NOTE — Assessment & Plan Note (Addendum)
Update A1c today 

## 2021-01-09 NOTE — Assessment & Plan Note (Signed)
Preventative protocols reviewed and updated unless pt declined. Discussed healthy diet and lifestyle.  

## 2021-01-09 NOTE — Progress Notes (Signed)
Patient ID: Rodney Charles, male    DOB: [birth year], 65 y.o.   MRN: [id number]  This visit was conducted in person.  BP 140/74   Pulse 67   Temp (!) 97.3 F (36.3 C) (Temporal)   Ht 6' 1.5" (1.867 m)   Wt 260 lb 5 oz (118.1 kg)   SpO2 94%   BMI 33.88 kg/m    CC: AMW Subjective:   HPI: Rodney Charles is a 65 y.o. male presenting on 01/09/2021 for Medicare Wellness   Did not see health advisor this year.   Hearing Screening   500Hz  1000Hz  2000Hz  4000Hz   Right ear 40 40 20 40  Left ear 25 25 20  40  Vision Screening - Comments:: Last eye exam, 11/2020.  [facility] Visit from 01/09/2021 in [facility]  PHQ-2 Total Score 2       Fall Risk  01/09/2021 12/21/2019 12/09/2018 12/04/2017 11/30/2016  Falls in the past year? 0 0 0 Yes No  Comment - - - fell while walking down steps -  Number falls in past yr: - - 0 1 -  Injury with Fall? 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  Colonoscopy 04/2014 WNL, rpt 5 yrs Collene Mares) - rpt colonoscopy scheduled for 04/2019 but unsure if completed - encouraged call to inquire about colonoscopy/f/u due date.  Prostate cancer screening - would like continue screening. No fmhx prostate cancer. Nocturia 2x/night.  Lung cancer screening - started 2017, through 2021, graduated from f/u (stopped smoking 2005).  Flu shot yearly  [place] 05/2019, 06/2019, booster 02/2020  Tdap 09/2012  shingrix - 09/11/2019, rpt due ([facility])  Advanced directives - brings copy from 2005, scanned 02/2019. This is not notarized. Would want Parke Poisson then Maretta Los and Eston Mould (daughters) to be HCPOA. No living will. Seat belt use discussed Sunscreen use discussed. No changing moles on skin.  Ex smoker quit  2005 Alcohol - none  Dentist yearly - has partial upper/lower dentures  Eye exam yearly  Bowel - no constipation  Bladder - no incontinence    Lives alone, dog   Occupation: disability for lumbar and neck pain (~2007) Handicap placard - unable to walk >248ft w/o stopping to rest Edu: HS   Activity: walks 15 min daily  Diet: good water, fruits/vegetables daily     Relevant past medical, surgical, family and social history reviewed and updated as indicated. Interim medical history since our last visit reviewed. Allergies and medications reviewed and updated. Outpatient Medications Prior to Visit  Medication Sig Dispense Refill   gabapentin (NEURONTIN) 300 MG capsule TAKE 1 CAPSULE(300 MG) BY MOUTH THREE TIMES DAILY 270 capsule 1   Menthol, Topical Analgesic, (BIOFREEZE EX) Apply topically as directed.     MITIGARE 0.6 MG CAPS TAKE 1 CAPSULE BY MOUTH DAILY AS NEEDED FOR GOUT FLARE 30 capsule 1   Multiple Vitamin (MULTIVITAMIN) tablet Take 1 tablet by mouth daily.     amLODipine (NORVASC) 5 MG tablet TAKE 1 TABLET(5 MG) BY MOUTH DAILY 30 tablet 3   celecoxib (CELEBREX) 100 MG capsule TAKE 1 CAPSULE(100 MG) BY MOUTH DAILY 90 capsule 1   esomeprazole (NEXIUM) 40 MG capsule Take 40 mg by  mouth daily before breakfast.     fluticasone (FLONASE) 50 MCG/ACT nasal spray SHAKE LIQUID AND USE 2 SPRAYS IN EACH NOSTRIL DAILY 16 g 5   HYDROcodone-acetaminophen (NORCO) 10-325 MG tablet Take 1 tablet by mouth every 8 (eight) hours as needed. 40 tablet 0   nortriptyline (PAMELOR) 25 MG capsule TAKE 1 CAPSULE(25 MG) BY MOUTH AT BEDTIME 90 capsule 3   rosuvastatin (CRESTOR) 5 MG tablet TAKE 1 TABLET BY MOUTH EVERY OTHER DAY 90 tablet 1   sertraline (ZOLOFT) 50 MG tablet Take 1 tablet (50 mg total) by mouth daily. 90 tablet 1   No facility-administered medications prior to visit.     Per HPI unless specifically indicated in ROS section below Review of Systems  Constitutional:  Negative for activity  change, appetite change, chills, fatigue, fever and unexpected weight change.  HENT:  Negative for hearing loss.   Eyes:  Negative for visual disturbance.  Respiratory:  Negative for cough, chest tightness, shortness of breath and wheezing.   Cardiovascular:  Negative for chest pain, palpitations and leg swelling.  Gastrointestinal:  Negative for abdominal distention, abdominal pain, blood in stool, constipation, diarrhea, nausea and vomiting.  Genitourinary:  Negative for difficulty urinating and hematuria.  Musculoskeletal:  Negative for arthralgias, myalgias and neck pain.  Skin:  Negative for rash.  Neurological:  Negative for dizziness, seizures, syncope and headaches.  Hematological:  Negative for adenopathy. Does not bruise/bleed easily.  Psychiatric/Behavioral:  Negative for dysphoric mood. The patient is not nervous/anxious.    Objective:  BP 140/74   Pulse 67   Temp (!) 97.3 F (36.3 C) (Temporal)   Ht 6' 1.5" (1.867 m)   Wt 260 lb 5 oz (118.1 kg)   SpO2 94%   BMI 33.88 kg/m   Wt Readings from Last 3 Encounters:  01/09/21 260 lb 5 oz (118.1 kg)  09/21/20 261 lb (118.4 kg)  06/20/20 261 lb 1 oz (118.4 kg)      Physical Exam Vitals and nursing note reviewed.  Constitutional:      General: He is not in acute distress.    Appearance: Normal appearance. He is well-developed. He is not ill-appearing.  HENT:     Head: Normocephalic and atraumatic.     Right Ear: Hearing, tympanic membrane, ear canal and external ear normal.     Left Ear: Hearing, tympanic membrane, ear canal and external ear normal.  Eyes:     General: No scleral icterus.    Extraocular Movements: Extraocular movements intact.     Conjunctiva/sclera: Conjunctivae normal.     Pupils: Pupils are equal, round, and reactive to light.  Neck:     Thyroid: No thyroid mass or thyromegaly.     Vascular: No carotid bruit.  Cardiovascular:     Rate and Rhythm: Normal rate and regular rhythm.     Pulses: Normal  pulses.          Radial pulses are 2+ on the right side and 2+ on the left side.     Heart sounds: Normal heart sounds. No murmur heard. Pulmonary:     Effort: Pulmonary effort is normal. No respiratory distress.     Breath sounds: Normal breath sounds. No wheezing, rhonchi or rales.  Abdominal:     General: Bowel sounds are normal. There is no distension.     Palpations: Abdomen is soft. There is no mass.     Tenderness: There is no abdominal tenderness. There is no guarding or rebound.  Hernia: No hernia is present.  Musculoskeletal:        General: Normal range of motion.     Cervical back: Normal range of motion and neck supple.     Right lower leg: No edema.     Left lower leg: No edema.  Lymphadenopathy:     Cervical: No cervical adenopathy.  Skin:    General: Skin is warm and dry.     Findings: No rash.  Neurological:     General: No focal deficit present.     Mental Status: He is alert and oriented to person, place, and time.     Comments:  3/3 recall  3/5 calculation DLORW   Psychiatric:        Mood and Affect: Mood normal.        Behavior: Behavior normal.        Thought Content: Thought content normal.        Judgment: Judgment normal.      Results for orders placed or performed in visit on 09/21/20  Comprehensive metabolic panel  Result Value Ref Range   Sodium 138 135 - 145 mEq/L   Potassium 3.8 3.5 - 5.1 mEq/L   Chloride 103 96 - 112 mEq/L   CO2 26 19 - 32 mEq/L   Glucose, Bld 88 70 - 99 mg/dL   BUN 12 6 - 23 mg/dL   Creatinine, Ser 1.11 0.40 - 1.50 mg/dL   Total Bilirubin 0.7 0.2 - 1.2 mg/dL   Alkaline Phosphatase 59 39 - 117 U/L   AST 25 0 - 37 U/L   ALT 25 0 - 53 U/L   Total Protein 7.4 6.0 - 8.3 g/dL   Albumin 4.5 3.5 - 5.2 g/dL   GFR 71.07 >60.00 mL/min   Calcium 9.3 8.4 - 10.5 mg/dL  CBC with Differential/Platelet  Result Value Ref Range   WBC 6.3 4.0 - 10.5 K/uL   RBC 5.23 4.22 - 5.81 Mil/uL   Hemoglobin 13.8 13.0 - 17.0 g/dL   HCT 42.2  39.0 - 52.0 %   MCV 80.6 78.0 - 100.0 fl   MCHC 32.7 30.0 - 36.0 g/dL   RDW 13.9 11.5 - 15.5 %   Platelets 163.0 150.0 - 400.0 K/uL   Neutrophils Relative % 37.5 (L) 43.0 - 77.0 %   Lymphocytes Relative 46.2 (H) 12.0 - 46.0 %   Monocytes Relative 10.8 3.0 - 12.0 %   Eosinophils Relative 4.8 0.0 - 5.0 %   Basophils Relative 0.7 0.0 - 3.0 %   Neutro Abs 2.3 1.4 - 7.7 K/uL   Lymphs Abs 2.9 0.7 - 4.0 K/uL   Monocytes Absolute 0.7 0.1 - 1.0 K/uL   Eosinophils Absolute 0.3 0.0 - 0.7 K/uL   Basophils Absolute 0.0 0.0 - 0.1 K/uL  TSH  Result Value Ref Range   TSH 2.42 0.35 - 5.50 uIU/mL  Hemoglobin A1c  Result Value Ref Range   Hgb A1c MFr Bld 6.3 4.6 - 6.5 %   Depression screen [facility] 2/9 01/09/2021 12/21/2019 12/09/2018 12/04/2017 11/30/2016  Decreased Interest 1 1 0 0 3  Down, Depressed, Hopeless 1 2 0 3 3  PHQ - 2 Score 2 3 0 3 6  Altered sleeping 1 1 0 3 1  Tired, decreased energy 1 0 0 1 3  Change in appetite 0 0 0 0 0  Feeling bad or failure about yourself  0 0 0 1 1  Trouble concentrating 0 1 0 3 2  Moving slowly  or fidgety/restless 0 0 0 0 1  Suicidal thoughts 0 0 0 0 0  PHQ-9 Score 4 5 0 11 14  Difficult doing work/chores - - Not difficult at all Somewhat difficult Somewhat difficult    GAD 7 : Generalized Anxiety Score 01/09/2021 12/21/2019  Nervous, Anxious, on Edge 1 1  Control/stop worrying 1 1  Worry too much - different things 1 1  Trouble relaxing 0 1  Restless 0 0  Easily annoyed or irritable 0 0  Afraid - awful might happen 0 0  Total GAD 7 Score 3 4   Assessment & Plan:  This visit occurred during the SARS-CoV-2 public health emergency.  Safety protocols were in place, including screening questions prior to the visit, additional usage of staff PPE, and extensive cleaning of exam room while observing appropriate contact time as indicated for disinfecting solutions.   Problem List Items Addressed This Visit     Medicare annual wellness visit, subsequent -  Primary (Chronic)    I have personally reviewed the Medicare Annual Wellness questionnaire and have noted 1. The patient's medical and social history 2. Their use of alcohol, tobacco or illicit drugs 3. Their current medications and supplements 4. The patient's functional ability including ADL's, fall risks, home safety risks and hearing or visual impairment. Cognitive function has been assessed and addressed as indicated.  5. Diet and physical activity 6. Evidence for depression or mood disorders The patients weight, height, BMI have been recorded in the chart. I have made referrals, counseling and provided education to the patient based on review of the above and I have provided the pt with a written personalized care plan for preventive services. Provider list updated.. See scanned questionairre as needed for further documentation. Reviewed preventative protocols and updated unless pt declined.       Encounter for chronic pain management (Chronic)    [place] CSRS reviewed.  Update UDS today       Relevant Orders   DRUG MONITORING, PANEL 8 WITH CONFIRMATION, URINE   Health maintenance examination (Chronic)    Preventative protocols reviewed and updated unless pt declined. Discussed healthy diet and lifestyle.       GERD (gastroesophageal reflux disease)    Stable period on daily nexium.       Relevant Medications   esomeprazole (NEXIUM) 40 MG capsule   Mood disorder (HCC)    Stable period on sertraline 50mg  daily and nortriptyline nightly - continue      Chronic lower back pain   Relevant Medications   celecoxib (CELEBREX) 100 MG capsule   HYDROcodone-acetaminophen (NORCO) 10-325 MG tablet   nortriptyline (PAMELOR) 25 MG capsule   sertraline (ZOLOFT) 50 MG tablet   HLD (hyperlipidemia)    Chronic, update FLP on crestor 5mg  daily. The 10-year ASCVD risk score (Arnett DK, et al., 2019) is: 16.1%   Values used to calculate the score:     Age: 7 years     Sex: Male     Is  Non-Hispanic African American: Yes     Diabetic: No     Tobacco smoker: No     Systolic Blood Pressure: 850 mmHg     Is BP treated: Yes     HDL Cholesterol: 62.3 mg/dL     Total Cholesterol: 182 mg/dL       Relevant Medications   amLODipine (NORVASC) 5 MG tablet   rosuvastatin (CRESTOR) 5 MG tablet   Gout    Stable period on PRN colchicine with no  recent flare.       Aortic atherosclerosis (HCC)    Continue daily statin.       Relevant Medications   amLODipine (NORVASC) 5 MG tablet   rosuvastatin (CRESTOR) 5 MG tablet   CAD (coronary artery disease)    Continue daily crestor. No anginal symptoms.       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       Primary hypertension    Chronic, stable on amlodipine 5mg daily.       Relevant Medications   amLODipine (NORVASC) 5 MG tablet   rosuvastatin (CRESTOR) 5 MG tablet   Other Visit Diagnoses     Special screening for malignant neoplasm of prostate       Need for influenza vaccination       Relevant Orders   Flu Vaccine QUAD 14mo+IM (Fluarix, Fluzone & Alfiuria Quad PF) (Completed)        Meds ordered this encounter  Medications   amLODipine (NORVASC) 5 MG tablet    Sig: TAKE 1 TABLET(5 MG) BY MOUTH DAILY    Dispense:  90 tablet    Refill:  3   celecoxib (CELEBREX) 100 MG capsule    Sig: Take 1 capsule (100 mg total) by mouth daily.    Dispense:  90 capsule    Refill:  3   esomeprazole (NEXIUM) 40 MG capsule    Sig: Take 1 capsule (40 mg total) by mouth daily before breakfast.    Dispense:  90 capsule    Refill:  3   fluticasone (FLONASE) 50 MCG/ACT nasal spray    Sig: SHAKE LIQUID AND USE 2 SPRAYS IN EACH NOSTRIL DAILY     Dispense:  16 g    Refill:  6   HYDROcodone-acetaminophen (NORCO) 10-325 MG tablet    Sig: Take 1 tablet by mouth every 8 (eight) hours as needed.    Dispense:  40 tablet    Refill:  0   nortriptyline (PAMELOR) 25 MG capsule    Sig: TAKE 1 CAPSULE(25 MG) BY MOUTH AT BEDTIME    Dispense:  90 capsule    Refill:  3   rosuvastatin (CRESTOR) 5 MG tablet    Sig: Take 1 tablet (5 mg total) by mouth daily.    Dispense:  90 tablet    Refill:  3   sertraline (ZOLOFT) 50 MG tablet    Sig: Take 1 tablet (50 mg total) by mouth daily.    Dispense:  90 tablet    Refill:  3    Orders Placed This Encounter  Procedures   Flu Vaccine QUAD 51mo+IM (Fluarix, Fluzone & Alfiuria Quad PF)   DRUG MONITORING, PANEL 8 WITH CONFIRMATION, URINE    Hydrocodone     Patient instructions: Flu shot today  Labs today  Urine drug screen today Call Dr Collene Mares to ask about follow up colonoscopy [phone].  [facility] to ask about 2nd shingrix vaccine as it seems you're due.  Good to see you today Return as needed or in 3 months for follow up visit.   Follow up plan: Return in about 3 months (around  04/09/2021) for follow up visit.  Ria Bush, MD

## 2021-01-09 NOTE — Assessment & Plan Note (Signed)
Continue daily statin. 

## 2021-01-09 NOTE — Assessment & Plan Note (Signed)
Continue daily crestor. No anginal symptoms.

## 2021-01-09 NOTE — Assessment & Plan Note (Addendum)
[place] CSRS reviewed.  Update UDS today

## 2021-01-09 NOTE — Assessment & Plan Note (Signed)
Chronic, stable on amlodipine 5mg daily.  

## 2021-01-09 NOTE — Assessment & Plan Note (Signed)
Continues flonase regularly.  

## 2021-01-09 NOTE — Assessment & Plan Note (Signed)
Stable period on PRN colchicine with no recent flare.

## 2021-01-09 NOTE — Assessment & Plan Note (Signed)
Stable period on sertraline 50mg  daily and nortriptyline nightly - continue

## 2021-01-09 NOTE — Assessment & Plan Note (Signed)
Stable period on daily nexium.

## 2021-01-09 NOTE — Assessment & Plan Note (Addendum)
Chronic, update FLP on crestor 5mg  daily. The 10-year ASCVD risk score (Arnett DK, et al., 2019) is: 16.1%   Values used to calculate the score:     Age: 65 years     Sex: Male     Is Non-Hispanic African American: Yes     Diabetic: No     Tobacco smoker: No     Systolic Blood Pressure: 952 mmHg     Is BP treated: Yes     HDL Cholesterol: 62.3 mg/dL     Total Cholesterol: 182 mg/dL

## 2021-01-09 NOTE — Assessment & Plan Note (Signed)
Continue to encourage healthy diet and lifestyle choices.  

## 2021-01-12 LAB — DRUG MONITORING, PANEL 8 WITH CONFIRMATION, URINE
6 Acetylmorphine: NEGATIVE ng/mL (ref <10)
Alcohol Metabolites: NEGATIVE ng/mL (ref <500)
Amphetamines: NEGATIVE ng/mL (ref <500)
Benzodiazepines: NEGATIVE ng/mL (ref <100)
Buprenorphine, Urine: NEGATIVE ng/mL (ref <5)
Cocaine Metabolite: NEGATIVE ng/mL (ref <150)
Codeine: NEGATIVE ng/mL (ref <50)
Creatinine: 95.7 mg/dL (ref >=20.0)
Hydrocodone: 909 ng/mL — ABNORMAL HIGH (ref <50)
Hydromorphone: 259 ng/mL — ABNORMAL HIGH (ref <50)
MDMA: NEGATIVE ng/mL (ref <500)
Marijuana Metabolite: NEGATIVE ng/mL (ref <20)
Morphine: NEGATIVE ng/mL (ref <50)
Norhydrocodone: 1271 ng/mL — ABNORMAL HIGH (ref <50)
Opiates: POSITIVE ng/mL — AB (ref <100)
Oxidant: NEGATIVE ug/mL (ref <200)
Oxycodone: NEGATIVE ng/mL (ref <100)
pH: 5.5 (ref 4.5–9.0)

## 2021-01-12 LAB — DM TEMPLATE

## 2021-01-30 ENCOUNTER — Encounter: Payer: Self-pay | Admitting: Family Medicine

## 2021-02-09 ENCOUNTER — Telehealth: Payer: Self-pay

## 2021-02-09 ENCOUNTER — Telehealth: Payer: Self-pay | Admitting: Family Medicine

## 2021-02-09 MED ORDER — HYDROCODONE-ACETAMINOPHEN 10-325 MG PO TABS: 1.0000 | ORAL_TABLET | Freq: Three times a day (TID) | ORAL | 0 refills | Status: DC | PRN

## 2021-02-09 NOTE — Telephone Encounter (Signed)
°  Encourage patient to contact the pharmacy for refills or they can request refills through [facility]:  Please schedule appointment if longer than 1 year  NEXT APPOINTMENT DATE:04/10/21  MEDICATION:HYDROcodone-acetaminophen (NORCO) 10-325 MG tablet  Is the patient out of medication? no  PHARMACY:[id number] - [place], [address] AT [facility]  Let patient know to contact pharmacy at the end of the day to make sure medication is ready.  Please notify patient to allow 48-72 hours to process  CLINICAL FILLS OUT ALL BELOW:   LAST REFILL:  QTY:  REFILL DATE:    OTHER COMMENTS:    Okay for refill?  Please advise

## 2021-02-09 NOTE — Telephone Encounter (Signed)
Last OV--01/09/2021 Next upcoming scheduled OV--04/10/2021 Last RF---01/09/2021--#40 no refills.

## 2021-02-09 NOTE — Telephone Encounter (Signed)
Left message for patient to call back.  Received refill request from [facility] for Mitigrate/colchicine medication,. This was refilled on 12/07/20 for 30 tablets with 1 refill. This is to take as needed.  Does patient truly need a refill? How often is patient taking this medication? This could have been an automatic refill request from the pharmacy

## 2021-02-09 NOTE — Telephone Encounter (Signed)
ERx 

## 2021-02-10 ENCOUNTER — Telehealth: Payer: Self-pay | Admitting: Family Medicine

## 2021-02-10 NOTE — Telephone Encounter (Signed)
Printed out Pa questionnaire found in [address] drive. Dr. Darnell Level filled it out and signed. I attempted to [facility] to [facility] but fax kept ringing busy. Form left on Lisa's desk to try again on Monday.

## 2021-02-10 NOTE — Telephone Encounter (Signed)
[facility] called in andstated that they have been waiting 3 weeks for a PA for the hydrocodone and [facility] and they faxed another PA while on the phone together.

## 2021-02-13 NOTE — Telephone Encounter (Signed)
Faxed form.

## 2021-02-15 NOTE — Telephone Encounter (Signed)
Received faxed PA approval, valid 02/05/2021- 02/13/2022.  Made pharmacy aware.  States pt has already picked up rx.

## 2021-02-15 NOTE — Telephone Encounter (Signed)
Left another message on voicemail for patient to call the office back. 

## 2021-02-15 NOTE — Telephone Encounter (Signed)
Spoke with patient and he states he does not need a refill right now. He does say that he is taking this medication 1 tablet daily and sometimes 2 times daily. With cold weather especially gets pain in the elbows and arms. Advised patient this is written to take as needed and will let Dr G review to see if patient should take this differently or any other feedback.

## 2021-02-21 NOTE — Telephone Encounter (Signed)
Spoke with pt relaying Dr. G's message. Pt verbalizes understanding.  

## 2021-02-21 NOTE — Telephone Encounter (Signed)
Recommend he limit colchicine use to acute gout flares - symptoms of a gout flare are a warm, red, swollen and painful joint.  For osteoarthritis pain (wear and tear arthritis) he may take tylenol 500mg  1-2 times daily as needed, continue his daily celebrex and PRN hydrocodone.  If noting frequent gout flares, we should consider daily medicine to prevent gout (allopurinol).

## 2021-03-13 ENCOUNTER — Other Ambulatory Visit: Payer: Self-pay

## 2021-03-13 MED ORDER — HYDROCODONE-ACETAMINOPHEN 10-325 MG PO TABS: 1.0000 | ORAL_TABLET | Freq: Three times a day (TID) | ORAL | 0 refills | Status: DC | PRN

## 2021-03-13 NOTE — Telephone Encounter (Signed)
Norco 10-325  LAST APPOINTMENT DATE: 01/09/2021   NEXT APPOINTMENT DATE: 04/10/2021    LAST REFILL: 02/09/2021  QTY: #40

## 2021-03-13 NOTE — Telephone Encounter (Signed)
ERx 

## 2021-04-10 ENCOUNTER — Encounter: Payer: Self-pay | Admitting: Family Medicine

## 2021-04-10 ENCOUNTER — Ambulatory Visit: Payer: Medicare (Managed Care) | Admitting: Family Medicine

## 2021-04-10 ENCOUNTER — Other Ambulatory Visit: Payer: Self-pay

## 2021-04-10 VITALS — BP 134/68 | HR 75 | Temp 97.5°F | Ht 73.5 in | Wt 265.0 lb

## 2021-04-10 DIAGNOSIS — M79602 Pain in left arm: Secondary | ICD-10-CM | POA: Diagnosis not present

## 2021-04-10 DIAGNOSIS — M1A0210 Idiopathic chronic gout, right elbow, without tophus (tophi): Secondary | ICD-10-CM

## 2021-04-10 DIAGNOSIS — G8929 Other chronic pain: Secondary | ICD-10-CM | POA: Insufficient documentation

## 2021-04-10 DIAGNOSIS — M2550 Pain in unspecified joint: Secondary | ICD-10-CM | POA: Insufficient documentation

## 2021-04-10 DIAGNOSIS — M542 Cervicalgia: Secondary | ICD-10-CM | POA: Diagnosis not present

## 2021-04-10 DIAGNOSIS — S46002D Unspecified injury of muscle(s) and tendon(s) of the rotator cuff of left shoulder, subsequent encounter: Secondary | ICD-10-CM

## 2021-04-10 DIAGNOSIS — M25512 Pain in left shoulder: Secondary | ICD-10-CM | POA: Insufficient documentation

## 2021-04-10 LAB — C-REACTIVE PROTEIN: CRP: <1 mg/dL (ref 0.5–20.0)

## 2021-04-10 LAB — SEDIMENTATION RATE: Sed Rate: 16 mm/hr (ref 0–20)

## 2021-04-10 MED ORDER — PREDNISONE 20 MG PO TABS: ORAL_TABLET | ORAL | 0 refills | Status: DC

## 2021-04-10 MED ORDER — HYDROCODONE-ACETAMINOPHEN 10-325 MG PO TABS: 1.0000 | ORAL_TABLET | Freq: Three times a day (TID) | ORAL | 0 refills | Status: DC | PRN

## 2021-04-10 NOTE — Assessment & Plan Note (Signed)
Describes L cervical radiculopathy symptoms however no obvious arm weakness on exam today.  ?Check ESR/CRP to evaluate for PMR.  ?Rx prednisone taper, then start daily celebrex.  ?Update if worsening or not improving with treatment, to consider further cervical imaging.  ?

## 2021-04-10 NOTE — Patient Instructions (Addendum)
Labs today  ?Take prednisone taper for the next 8 days, then start celebrex '100mg'$  daily.  ?Hydrocodone refilled today.  ?Let us know if left arm symptoms worsening especially worsening weakness.  ?Return in 3 months for pain visit.  ?

## 2021-04-10 NOTE — Assessment & Plan Note (Addendum)
Managed with PRN colchicine.  ?

## 2021-04-10 NOTE — Assessment & Plan Note (Addendum)
H/o this, saw ortho who recommended RTC repair, pt declined at that time due to pandemic.  ?
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
## 2021-04-10 NOTE — Assessment & Plan Note (Signed)
No acute change in chronic neck pain to account for new L arm symptoms - see below.  ?

## 2021-04-10 NOTE — Assessment & Plan Note (Addendum)
[place] CSRS reviewed  ?Stable period on hydrocodone, nortriptyline, gabapentin, celebrex. He has not been taking regularly - advised start this daily.  ?

## 2021-05-11 ENCOUNTER — Other Ambulatory Visit: Payer: Self-pay

## 2021-05-11 MED ORDER — HYDROCODONE-ACETAMINOPHEN 10-325 MG PO TABS: 1.0000 | ORAL_TABLET | Freq: Three times a day (TID) | ORAL | 0 refills | Status: DC | PRN

## 2021-05-11 NOTE — Telephone Encounter (Signed)
ERx 

## 2021-05-11 NOTE — Telephone Encounter (Signed)
LAST APPOINTMENT DATE: 04/10/2021 ? ? ?NEXT APPOINTMENT DATE: 07/14/2021 ? ? ? ?LAST REFILL: [id number]  ? ?QTY: 40  ? ?[place], [address] AT [facility]  ?[place], [place] [address]  ? ? ? ?  ?

## 2021-06-05 ENCOUNTER — Other Ambulatory Visit: Payer: Self-pay

## 2021-06-05 MED ORDER — GABAPENTIN 300 MG PO CAPS: ORAL_CAPSULE | ORAL | 1 refills | Status: DC

## 2021-06-08 ENCOUNTER — Other Ambulatory Visit: Payer: Self-pay

## 2021-06-08 ENCOUNTER — Emergency Department (HOSPITAL_BASED_OUTPATIENT_CLINIC_OR_DEPARTMENT_OTHER)
Admission: EM | Admit: 2021-06-08 | Discharge: 2021-06-09 | Disposition: A | Discharge status: Home / Self Care | Payer: Medicare (Managed Care) | Attending: Emergency Medicine | Admitting: Emergency Medicine

## 2021-06-08 ENCOUNTER — Encounter (HOSPITAL_BASED_OUTPATIENT_CLINIC_OR_DEPARTMENT_OTHER): Payer: Self-pay

## 2021-06-08 ENCOUNTER — Emergency Department (HOSPITAL_BASED_OUTPATIENT_CLINIC_OR_DEPARTMENT_OTHER): Payer: Medicare (Managed Care) | Admitting: Radiology

## 2021-06-08 DIAGNOSIS — M109 Gout, unspecified: Secondary | ICD-10-CM

## 2021-06-08 DIAGNOSIS — M25571 Pain in right ankle and joints of right foot: Secondary | ICD-10-CM | POA: Diagnosis not present

## 2021-06-08 DIAGNOSIS — I10 Essential (primary) hypertension: Secondary | ICD-10-CM | POA: Insufficient documentation

## 2021-06-08 DIAGNOSIS — Z79899 Other long term (current) drug therapy: Secondary | ICD-10-CM | POA: Insufficient documentation

## 2021-06-08 DIAGNOSIS — M7989 Other specified soft tissue disorders: Secondary | ICD-10-CM | POA: Diagnosis not present

## 2021-06-08 DIAGNOSIS — I2510 Atherosclerotic heart disease of native coronary artery without angina pectoris: Secondary | ICD-10-CM | POA: Diagnosis not present

## 2021-06-08 NOTE — ED Triage Notes (Signed)
Pt presents [facility] with c/o lateral right ankle pain that started this am. ? ?Denies fall or injury. ? ?Pt using cane to transfer from stretcher to [place]. ? ? ?

## 2021-06-09 ENCOUNTER — Other Ambulatory Visit: Payer: Self-pay | Admitting: Family Medicine

## 2021-06-09 MED ORDER — PREDNISONE 20 MG PO TABS
40.0000 mg | ORAL_TABLET | Freq: Once | ORAL | Status: AC
  Administered 2021-06-09: 40 mg via ORAL
  Filled 2021-06-09: qty 2

## 2021-06-09 MED ORDER — HYDROCODONE-ACETAMINOPHEN 10-325 MG PO TABS: 1.0000 | ORAL_TABLET | Freq: Three times a day (TID) | ORAL | 0 refills | Status: DC | PRN

## 2021-06-09 MED ORDER — OXYCODONE-ACETAMINOPHEN 5-325 MG PO TABS: 1.0000 | ORAL_TABLET | Freq: Four times a day (QID) | ORAL | 0 refills | Status: DC | PRN

## 2021-06-09 MED ORDER — PREDNISONE 10 MG PO TABS: 20.0000 mg | ORAL_TABLET | Freq: Two times a day (BID) | ORAL | 0 refills | Status: DC

## 2021-06-09 MED ORDER — OXYCODONE-ACETAMINOPHEN 5-325 MG PO TABS
2.0000 | ORAL_TABLET | Freq: Once | ORAL | Status: AC
  Administered 2021-06-09: 2 via ORAL
  Filled 2021-06-09: qty 2

## 2021-06-09 NOTE — Telephone Encounter (Signed)
Caller Name: Rodney Charles ?Call back phone #: [phone] ? ?MEDICATION(S): HYDROcodone-acetaminophen (NORCO) 10-325 MG tablet ? ? ?Days of Med Remaining: 1 day left ? ?Has the patient contacted their pharmacy (YES/NO)?  No because its controlled ?IF YES, when and what did the pharmacy advise?  ?IF NO, request that the patient contact the pharmacy for the refills in the future.  ?           The pharmacy will send an electronic request (except for controlled medications). ? ?Preferred Pharmacy: [facility] on [address] ? ?~~~Please advise patient/caregiver to allow 2-3 business days to process RX refills.  ?

## 2021-06-09 NOTE — Telephone Encounter (Signed)
ERx 

## 2021-06-09 NOTE — Discharge Instructions (Signed)
Begin taking prednisone as prescribed. ? ?Begin taking Percocet as prescribed as needed for pain.   ? ?Follow-up with primary doctor if not improving in the next few days. ?

## 2021-06-09 NOTE — Telephone Encounter (Signed)
Last refill 05/11/21 #40 ?Last office visit 04/10/21 ?Upcoming appointment 07/14/21 ?UDS 01/09/21 ?

## 2021-06-09 NOTE — ED Provider Notes (Signed)
?Rodney Charles ?Provider Note ? ? ?CSN: [id number] ?Arrival date & time: 06/08/21  2230 ? ?  ? ?History ? ?Chief Complaint  ?Patient presents with  ? Ankle Pain  ? ? ?Rodney Charles is a 66 y.o. male. ? ?Patient is a 66 year old male with past medical history of gout, hyperlipidemia, coronary artery disease, hypertension.  Patient presenting with complaints of right ankle pain.  This started earlier today in the absence of any injury or trauma.  He describes severe pain with range of motion and ambulation.  He denies any fevers. ? ?The history is provided by the patient.  ? ?  ? ?Home Medications ?Prior to Admission medications   ?Medication Sig Start Date End Date Taking? Authorizing Provider  ?amLODipine (NORVASC) 5 MG tablet TAKE 1 TABLET(5 MG) BY MOUTH DAILY 01/09/21   Ria Bush, MD  ?celecoxib (CELEBREX) 100 MG capsule Take 1 capsule (100 mg total) by mouth daily. 01/09/21   Ria Bush, MD  ?esomeprazole ([place]) 40 MG capsule Take 1 capsule (40 mg total) by mouth daily before breakfast. 01/09/21   Ria Bush, MD  ?fluticasone [facility]) 50 MCG/ACT nasal spray SHAKE LIQUID AND USE 2 SPRAYS IN [facility] NOSTRIL DAILY 01/09/21   Ria Bush, MD  ?gabapentin (NEURONTIN) 300 MG capsule Take one capsule by mouth three times a day 06/05/21   Ria Bush, MD  ?HYDROcodone-acetaminophen [facility]) 10-325 MG tablet Take 1 tablet by mouth every 8 (eight) hours as needed. 05/11/21   Ria Bush, MD  ?Menthol, Topical Analgesic, (BIOFREEZE EX) Apply topically as directed.    [provider]  ?MITIGARE 0.6 MG CAPS TAKE 1 CAPSULE BY MOUTH DAILY AS NEEDED FOR GOUT FLARE 12/07/20   Ria Bush, MD  ?Multiple Vitamin (MULTIVITAMIN) tablet Take 1 tablet by mouth daily.    [provider]  ?nortriptyline (PAMELOR) 25 MG capsule TAKE 1 CAPSULE(25 MG) BY MOUTH AT BEDTIME 01/09/21   Ria Bush, MD  ?omeprazole (PRILOSEC) 40 MG capsule TAKE 1 CAPSULE BY MOUTH  EVERY DAY for 90    [provider]  ?predniSONE (DELTASONE) 20 MG tablet Take two tablets daily for 4 days followed by one tablet daily for 4 days 04/10/21   Ria Bush, MD  ?rosuvastatin (CRESTOR) 5 MG tablet Take 1 tablet (5 mg total) by mouth daily. 01/09/21   Ria Bush, MD  ?sertraline (ZOLOFT) 50 MG tablet Take 1 tablet (50 mg total) by mouth daily. 01/09/21   Ria Bush, MD  ?   ? ?Allergies    ?Patient has no known allergies.   ? ?Review of Systems   ?Review of Systems  ?All other systems reviewed and are negative. ? ?Physical Exam ?Updated Vital Signs ?BP (!) 127/94 (BP Location: Right Arm)   Pulse 90   Temp 98.5 ?F (36.9 ?C) (Oral)   Resp 18   Ht '6\' 2"'$  (1.88 m)   Wt 117.5 kg   SpO2 100%   BMI 33.25 kg/m?  ?Physical Exam ?Vitals and nursing note reviewed.  ?Constitutional:   ?   Appearance: Normal appearance.  ?HENT:  ?   Head: Normocephalic and atraumatic.  ?Pulmonary:  ?   Effort: Pulmonary effort is normal.  ?Musculoskeletal:  ?   Comments: The right ankle has swelling and warmth to the lateral malleolus and anterior ankle.  He has significant discomfort with any range of motion.  DP pulses are palpable and motor and sensation are intact throughout the remainder of the foot.  ?Skin: ?   General:  Skin is warm and dry.  ?Neurological:  ?   Mental Status: He is alert.  ? ? ?ED Results / Procedures / Treatments   ?Labs ?(all labs ordered are listed, but only abnormal results are displayed) ?Labs Reviewed - No data to display ? ?EKG ?None ? ?Radiology ?DG Ankle Complete Right ? ?Result Date: 06/08/2021 ?CLINICAL DATA:  Pain and swelling. EXAM: RIGHT ANKLE - COMPLETE 3+ VIEW COMPARISON:  None. FINDINGS: There is lateral soft tissue swelling. There is no evidence for acute fracture or dislocation. There are well corticated densities adjacent to the tip of the medial malleolus likely related to old injury. Joint spaces are maintained. IMPRESSION: 1. No acute fracture or  dislocation. 2. Lateral soft tissue swelling. Electronically Signed   By: Ronney Asters M.D.   On: 06/08/2021 22:56   ? ?Procedures ?Procedures  ? ? ?Medications Ordered in ED ?Medications - No data to display ? ?ED Course/ Medical Decision Making/ A&P ? ?Patient presenting with complaints of right ankle pain which appears to be a flareup of gout.  This will be treated with prednisone and Percocet.  I have considered septic joint, however he has a history of gout and this feels the same.   ? ?Final Clinical Impression(s) / ED Diagnoses ?Final diagnoses:  ?None  ? ? ?Rx / DC Orders ?ED Discharge Orders   ? ? None  ? ?  ? ? ?  ?Veryl Speak, MD ?06/09/21 [phone] ? ?

## 2021-06-13 ENCOUNTER — Telehealth: Payer: Self-pay

## 2021-06-14 NOTE — Telephone Encounter (Signed)
Patient notified as instructed by telephone and verbalized understanding. 

## 2021-06-14 NOTE — Telephone Encounter (Addendum)
Patient will need to pay out of pocket if insurance doesn't cover this.  ?He was just given oxycodone #20 by ER for gout of foot.  ?Recommend he wait to fill hydrocodone when he finishes oxycodone course.  ?

## 2021-07-11 DIAGNOSIS — Z125 Encounter for screening for malignant neoplasm of prostate: Secondary | ICD-10-CM | POA: Diagnosis not present

## 2021-07-14 ENCOUNTER — Ambulatory Visit (INDEPENDENT_AMBULATORY_CARE_PROVIDER_SITE_OTHER): Payer: Medicare (Managed Care) | Admitting: Family Medicine

## 2021-07-14 ENCOUNTER — Encounter: Payer: Self-pay | Admitting: Family Medicine

## 2021-07-14 VITALS — BP 136/78 | HR 73 | Temp 97.9°F | Ht 74.0 in | Wt 259.4 lb

## 2021-07-14 DIAGNOSIS — G8929 Other chronic pain: Secondary | ICD-10-CM

## 2021-07-14 DIAGNOSIS — M542 Cervicalgia: Secondary | ICD-10-CM

## 2021-07-14 DIAGNOSIS — M545 Low back pain, unspecified: Secondary | ICD-10-CM | POA: Diagnosis not present

## 2021-07-14 DIAGNOSIS — M1A0210 Idiopathic chronic gout, right elbow, without tophus (tophi): Secondary | ICD-10-CM

## 2021-07-14 DIAGNOSIS — M2550 Pain in unspecified joint: Secondary | ICD-10-CM | POA: Diagnosis not present

## 2021-07-14 DIAGNOSIS — K219 Gastro-esophageal reflux disease without esophagitis: Secondary | ICD-10-CM | POA: Diagnosis not present

## 2021-07-14 MED ORDER — OMEPRAZOLE 40 MG PO CPDR: 40.0000 mg | DELAYED_RELEASE_CAPSULE | Freq: Every day | ORAL | 1 refills | Status: DC

## 2021-07-14 MED ORDER — CELECOXIB 200 MG PO CAPS: 200.0000 mg | ORAL_CAPSULE | Freq: Every day | ORAL | 1 refills | Status: DC

## 2021-07-14 MED ORDER — COLCHICINE 0.6 MG PO CAPS: 1.0000 | ORAL_CAPSULE | Freq: Every day | ORAL | 1 refills | Status: DC | PRN

## 2021-07-14 NOTE — Assessment & Plan Note (Signed)
[place] CSRS reviewed, appropriate.  Stable period on current regimen. Discussed celebrex - including increased cardiovascular risk and risk of GI side effects. Will trial higher dose of celebrex '200mg'$  daily, update with effect or if any GI or cardiac symptoms develop.

## 2021-07-14 NOTE — Patient Instructions (Addendum)
You are doing well today  Try higher dose of celebrex '200mg'$  daily. Let us know how you do with this.  Return in 3 months for chronic pain follow up visit.

## 2021-07-14 NOTE — Assessment & Plan Note (Signed)
Continues omeprazole '40mg'$  daily. It seems insurance stopped covering Nexium.

## 2021-07-14 NOTE — Assessment & Plan Note (Signed)
Recent gout flare seen at ER, treated with prednisone and percocets.
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
## 2021-07-18 DIAGNOSIS — Z125 Encounter for screening for malignant neoplasm of prostate: Secondary | ICD-10-CM | POA: Diagnosis not present

## 2021-07-18 DIAGNOSIS — R351 Nocturia: Secondary | ICD-10-CM | POA: Diagnosis not present

## 2021-07-18 DIAGNOSIS — N401 Enlarged prostate with lower urinary tract symptoms: Secondary | ICD-10-CM | POA: Diagnosis not present

## 2021-07-18 DIAGNOSIS — N5201 Erectile dysfunction due to arterial insufficiency: Secondary | ICD-10-CM | POA: Diagnosis not present

## 2021-07-31 ENCOUNTER — Other Ambulatory Visit: Payer: Self-pay | Admitting: Family Medicine

## 2021-08-02 MED ORDER — HYDROCODONE-ACETAMINOPHEN 10-325 MG PO TABS: 1.0000 | ORAL_TABLET | Freq: Three times a day (TID) | ORAL | 0 refills | Status: DC | PRN

## 2021-08-02 NOTE — Telephone Encounter (Signed)
Patient is completely out, asking if this can be sent in today.

## 2021-08-02 NOTE — Telephone Encounter (Signed)
ERx - plz notify pt 

## 2021-08-03 NOTE — Telephone Encounter (Signed)
Notified pt by phn refill sent in.  Pt expresses his thanks.

## 2021-09-01 ENCOUNTER — Other Ambulatory Visit: Payer: Self-pay | Admitting: Family Medicine

## 2021-09-01 NOTE — Telephone Encounter (Signed)
  Encourage patient to contact the pharmacy for refills or they can request refills through [facility]  Did the patient contact the pharmacy:  no   LAST APPOINTMENT DATE:  Please schedule appointment if longer than 1 year  NEXT APPOINTMENT DATE:10/16/2021  MEDICATION:HYDROcodone-acetaminophen HYDROcodone-acetaminophen ([place]) 10-325 MG tablet  Is the patient out of medication? no  If not, how much is left? 2 left  Is this a 90 day supply: no  PHARMACY: [place], [place] AT [facility] Phone:  [phone]  Fax:  [phone]      Let patient know to contact pharmacy at the end of the day to make sure medication is ready.  Please notify patient to allow 48-72 hours to process

## 2021-09-01 NOTE — Telephone Encounter (Signed)
Name of Medication: Hydrocodone-APAP Name of Pharmacy: [facility] or Written Date and Quantity: 08/02/21, #40 Last Office Visit and Type: 07/14/21, chronic pain mgmt Next Office Visit and Type: 10/16/21, chronic pain mgmt Last Controlled Substance Agreement Date: 06/15/19 Last UDS: 06/15/19

## 2021-09-02 ENCOUNTER — Other Ambulatory Visit: Payer: Self-pay | Admitting: Family Medicine

## 2021-09-04 MED ORDER — HYDROCODONE-ACETAMINOPHEN 10-325 MG PO TABS: 1.0000 | ORAL_TABLET | Freq: Three times a day (TID) | ORAL | 0 refills | Status: DC | PRN

## 2021-09-04 NOTE — Telephone Encounter (Signed)
Message from pt via pharmacy:  Your patient is requesting advance approval of refills for this medication to [facility]  Gabapentin Last filled:  08/31/21, #270 Last OV:  07/14/21, chronic pain mgmt f/u Next OV:  10/16/21, chronic pain mgmt f/u

## 2021-09-04 NOTE — Telephone Encounter (Signed)
ERx 

## 2021-10-04 DIAGNOSIS — Z0000 Encounter for general adult medical examination without abnormal findings: Secondary | ICD-10-CM | POA: Diagnosis not present

## 2021-10-05 ENCOUNTER — Other Ambulatory Visit: Payer: Self-pay | Admitting: Family Medicine

## 2021-10-05 MED ORDER — HYDROCODONE-ACETAMINOPHEN 10-325 MG PO TABS: 1.0000 | ORAL_TABLET | Freq: Three times a day (TID) | ORAL | 0 refills | Status: DC | PRN

## 2021-10-05 NOTE — Telephone Encounter (Signed)
ERx 

## 2021-10-05 NOTE — Telephone Encounter (Signed)
Name of Medication: Hydrocodone-APAP Name of Pharmacy: [place] or Written Date and Quantity: 09/04/21, #40 Last Office Visit and Type: 07/14/21, chronic pain f/u Next Office Visit and Type: 10/16/21, chronic pain f/u Last Controlled Substance Agreement Date: 06/15/19 Last UDS: 06/15/19

## 2021-10-05 NOTE — Telephone Encounter (Signed)
  Encourage patient to contact the pharmacy for refills or they can request refills through [facility]  Did the patient contact the pharmacy: No  LAST APPOINTMENT DATE: 07/14/21  NEXT APPOINTMENT DATE: 10/16/21  MEDICATION: HYDROcodone-acetaminophen (NORCO) 10-325 MG tablet  Is the patient out of medication? No  If not, how much is left? 1 left  PHARMACY: [place], [place] AT [facility]  Let patient know to contact pharmacy at the end of the day to make sure medication is ready.  Please notify patient to allow 48-72 hours to process

## 2021-10-16 ENCOUNTER — Encounter: Payer: Self-pay | Admitting: Family Medicine

## 2021-10-16 ENCOUNTER — Ambulatory Visit: Payer: Medicare (Managed Care) | Admitting: Family Medicine

## 2021-10-16 VITALS — BP 138/70 | HR 60 | Temp 97.5°F | Ht 74.0 in | Wt 264.2 lb

## 2021-10-16 DIAGNOSIS — M1A0210 Idiopathic chronic gout, right elbow, without tophus (tophi): Secondary | ICD-10-CM

## 2021-10-16 DIAGNOSIS — Z23 Encounter for immunization: Secondary | ICD-10-CM | POA: Diagnosis not present

## 2021-10-16 DIAGNOSIS — F39 Unspecified mood [affective] disorder: Secondary | ICD-10-CM | POA: Diagnosis not present

## 2021-10-16 DIAGNOSIS — G8929 Other chronic pain: Secondary | ICD-10-CM | POA: Diagnosis not present

## 2021-10-16 DIAGNOSIS — M545 Low back pain, unspecified: Secondary | ICD-10-CM | POA: Diagnosis not present

## 2021-10-16 MED ORDER — COLCHICINE 0.6 MG PO CAPS
1.0000 | ORAL_CAPSULE | Freq: Every day | ORAL | 3 refills | Status: DC | PRN
Start: 2021-10-16 — End: 2022-05-02
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
## 2021-10-16 NOTE — Patient Instructions (Addendum)
Flu shot today  Back down on colchicine to as needed for gout flares. Let me know if recurrent gout flares develop.  You are doing well today.  Return in 3 months for physical/wellness visit

## 2021-10-16 NOTE — Assessment & Plan Note (Signed)
Stable period on current regimen - continue this.

## 2021-10-16 NOTE — Assessment & Plan Note (Signed)
[place] CSRS reviewed ,appropriate.  Stable period on current regimen - continue.

## 2021-10-16 NOTE — Assessment & Plan Note (Signed)
Doing well on sertraline '50mg'$  daily, nortriptyline '25mg'$  nightly - continue this.  Reviewed GI bleed risk of SSRI and celecoxib.

## 2021-10-16 NOTE — Assessment & Plan Note (Addendum)
He's been taking colchicine daily - discussed PRN use. Discussed interaction of colchicine with statin.  Update urate next labs, consider allopurinol.

## 2021-11-01 ENCOUNTER — Other Ambulatory Visit: Payer: Self-pay | Admitting: Family Medicine

## 2021-11-03 ENCOUNTER — Telehealth: Payer: Self-pay | Admitting: Family Medicine

## 2021-11-03 NOTE — Telephone Encounter (Signed)
Name of Medication: Hydrocodone-APAP Name of Pharmacy: [place] or Written Date and Quantity: 10/05/21, #40 Last Office Visit and Type: 10/16/21, chronic pain mgmt Next Office Visit and Type: 01/31/22, CPE Last Controlled Substance Agreement Date: 06/15/19 Last UDS: 06/15/19

## 2021-11-03 NOTE — Telephone Encounter (Signed)
Caller Name: helios kohlmann  Call back phone #: [phone]  MEDICATION(S):  HYDROcodone-acetaminophen ([place]) 10-325 MG tablet  Days of Med Remaining:   Has the patient contacted their pharmacy (YES/NO)? NO What did pharmacy advise?   Preferred Pharmacy:  [facility], [address]  ~~~Please advise patient/caregiver to allow 2-3 business days to process RX refills.

## 2021-11-05 MED ORDER — HYDROCODONE-ACETAMINOPHEN 10-325 MG PO TABS: 1.0000 | ORAL_TABLET | Freq: Three times a day (TID) | ORAL | 0 refills | Status: DC | PRN

## 2021-11-05 NOTE — Telephone Encounter (Signed)
ERx 

## 2021-11-07 ENCOUNTER — Other Ambulatory Visit: Payer: Self-pay | Admitting: Family Medicine

## 2021-11-07 DIAGNOSIS — I10 Essential (primary) hypertension: Secondary | ICD-10-CM

## 2021-11-07 NOTE — Telephone Encounter (Signed)
Patient called in and stated that [facility] [address] has the medication on back order. He was wondering if Dr. Darnell Level could send the prescription over to [facility] [place] [employer]. Thank you!

## 2021-11-08 MED ORDER — HYDROCODONE-ACETAMINOPHEN 10-325 MG PO TABS: 1.0000 | ORAL_TABLET | Freq: Three times a day (TID) | ORAL | 0 refills | Status: DC | PRN

## 2021-11-08 NOTE — Telephone Encounter (Signed)
ERx to new pharmacy 

## 2021-11-08 NOTE — Addendum Note (Signed)
Addended by: Ria Bush on: 11/08/2021 05:21 PM   Modules accepted: Orders

## 2021-11-08 NOTE — Addendum Note (Signed)
Addended by: Ria Bush on: 11/08/2021 07:32 AM   Modules accepted: Orders

## 2021-11-08 NOTE — Telephone Encounter (Signed)
I've sent this to [facility]. Please call and cancel [facility] Rx and notify pt.

## 2021-11-08 NOTE — Telephone Encounter (Signed)
Patient called in stating that [facility] on [address] is on back order also for this medication. He was wanting to have the prescription sent over to [facility] ([place] [place] - [address]. Patient isn't sure if [facility] have the medication in stock. Thank you!

## 2021-11-09 NOTE — Telephone Encounter (Signed)
Called and left a message on VM at [facility] to cancel rx that was sent in 11-08-21. Called pt to let him know rx was sent to [facility].

## 2021-11-23 ENCOUNTER — Telehealth: Payer: Self-pay | Admitting: Family Medicine

## 2021-11-23 NOTE — Telephone Encounter (Signed)
Looks like #40 was filled 11/08/2021 for 1 month - he's too early for refill.

## 2021-11-23 NOTE — Telephone Encounter (Signed)
  Encourage patient to contact the pharmacy for refills or they can request refills through [facility]  Did the patient contact the pharmacy:  yes   LAST APPOINTMENT DATE:  10/16/21  NEXT APPOINTMENT DATE:  MEDICATION:HYDROcodone-acetaminophen (NORCO) 10-325 MG tablet  Is the patient out of medication? yes  If not, how much is left?  Is this a 90 day supply: 69  PHARMACY: [place], [place] AT [facility] Phone:  [phone]  Fax:  [phone]      Let patient know to contact pharmacy at the end of the day to make sure medication is ready.  Please notify patient to allow 48-72 hours to process

## 2021-11-23 NOTE — Telephone Encounter (Signed)
Name of Medication: Hydrocodone-APAP Name of Pharmacy: [place] or Written Date and Quantity: 11/08/21, #40 Last Office Visit and Type: 10/16/21, chronic pain mgmt Next Office Visit and Type: 01/31/22, CPE Last Controlled Substance Agreement Date: 06/15/19 Last UDS: 06/15/19

## 2021-11-24 MED ORDER — HYDROCODONE-ACETAMINOPHEN 10-325 MG PO TABS: 1.0000 | ORAL_TABLET | Freq: Three times a day (TID) | ORAL | 0 refills | Status: DC | PRN

## 2021-11-24 NOTE — Telephone Encounter (Signed)
Spoke with pt relaying Dr. Synthia Innocent message.  Pt states he never got it filled b/c of backorder. [facility] nor [facility] had med (see 11/03/21 refill note).  However, pt states [facility] now has med and pt is requesting refill be sent.

## 2021-11-24 NOTE — Telephone Encounter (Signed)
New Rx sent to [facility].

## 2021-11-24 NOTE — Addendum Note (Signed)
Addended by: Ria Bush on: 11/24/2021 01:00 PM   Modules accepted: Orders

## 2021-11-24 NOTE — Telephone Encounter (Signed)
Spoke with pt notifying him rx was sent to [facility].  Pt expresses his thanks.

## 2021-11-27 DIAGNOSIS — H35033 Hypertensive retinopathy, bilateral: Secondary | ICD-10-CM | POA: Diagnosis not present

## 2021-12-25 ENCOUNTER — Telehealth: Payer: Self-pay | Admitting: Family Medicine

## 2021-12-25 MED ORDER — HYDROCODONE-ACETAMINOPHEN 10-325 MG PO TABS: 1.0000 | ORAL_TABLET | Freq: Three times a day (TID) | ORAL | 0 refills | Status: DC | PRN

## 2021-12-25 NOTE — Telephone Encounter (Signed)
Is this okay to fill? 

## 2021-12-25 NOTE — Telephone Encounter (Signed)
ERx 

## 2021-12-25 NOTE — Telephone Encounter (Signed)
Caller Name: Donna Call back phone #: [phone]  MEDICATION(S):  HYDROcodone-acetaminophen (NORCO) 10-325 MG tablet   Days of Med Remaining: 1  Has the patient contacted their pharmacy (YES/NO)? NO What did pharmacy advise?   Preferred [facility]   ~~~Please advise patient/caregiver to allow 2-3 business days to process RX refills.

## 2022-01-03 ENCOUNTER — Other Ambulatory Visit: Payer: Self-pay | Admitting: Family Medicine

## 2022-01-05 ENCOUNTER — Telehealth: Payer: Self-pay | Admitting: Family Medicine

## 2022-01-05 NOTE — Telephone Encounter (Signed)
Left message for patient to call back and schedule Medicare Annual Wellness Visit (AWV) either virtually or phone  Left  my Rodney Charles number [phone]   Last AWV 01/09/21 please schedule with Nurse Health Adviser   45 min for awv-i and in office appointments 30 min for awv-s  phone/virtual appointments

## 2022-01-05 NOTE — Telephone Encounter (Signed)
Celebrex Last filled:  10/07/21, #90 Last OV:  10/16/21, chronic pain f/u Next OV:  01/31/22, CPE

## 2022-01-22 ENCOUNTER — Other Ambulatory Visit: Payer: Self-pay | Admitting: Family Medicine

## 2022-01-22 DIAGNOSIS — R7303 Prediabetes: Secondary | ICD-10-CM

## 2022-01-22 DIAGNOSIS — Z125 Encounter for screening for malignant neoplasm of prostate: Secondary | ICD-10-CM

## 2022-01-22 DIAGNOSIS — E785 Hyperlipidemia, unspecified: Secondary | ICD-10-CM

## 2022-01-22 DIAGNOSIS — I10 Essential (primary) hypertension: Secondary | ICD-10-CM

## 2022-01-22 DIAGNOSIS — M1A0210 Idiopathic chronic gout, right elbow, without tophus (tophi): Secondary | ICD-10-CM

## 2022-01-24 ENCOUNTER — Other Ambulatory Visit: Payer: Self-pay | Admitting: Family Medicine

## 2022-01-24 ENCOUNTER — Other Ambulatory Visit (INDEPENDENT_AMBULATORY_CARE_PROVIDER_SITE_OTHER): Payer: Medicare (Managed Care)

## 2022-01-24 DIAGNOSIS — E785 Hyperlipidemia, unspecified: Secondary | ICD-10-CM

## 2022-01-24 DIAGNOSIS — I10 Essential (primary) hypertension: Secondary | ICD-10-CM

## 2022-01-24 DIAGNOSIS — M1A0210 Idiopathic chronic gout, right elbow, without tophus (tophi): Secondary | ICD-10-CM | POA: Diagnosis not present

## 2022-01-24 DIAGNOSIS — Z125 Encounter for screening for malignant neoplasm of prostate: Secondary | ICD-10-CM

## 2022-01-24 DIAGNOSIS — F39 Unspecified mood [affective] disorder: Secondary | ICD-10-CM

## 2022-01-24 DIAGNOSIS — G8929 Other chronic pain: Secondary | ICD-10-CM

## 2022-01-24 DIAGNOSIS — R7303 Prediabetes: Secondary | ICD-10-CM | POA: Diagnosis not present

## 2022-01-24 LAB — MICROALBUMIN / CREATININE URINE RATIO
Creatinine,U: 195.8 mg/dL
Microalb Creat Ratio: 0.6 mg/g (ref 0.0–30.0)
Microalb, Ur: 1.2 mg/dL (ref 0.0–1.9)

## 2022-01-24 LAB — CBC WITH DIFFERENTIAL/PLATELET
Basophils Absolute: 0 10*3/uL (ref 0.0–0.1)
Basophils Relative: 0.7 % (ref 0.0–3.0)
Eosinophils Absolute: 0.3 10*3/uL (ref 0.0–0.7)
Eosinophils Relative: 4.2 % (ref 0.0–5.0)
HCT: 42 % (ref 39.0–52.0)
Hemoglobin: 13.6 g/dL (ref 13.0–17.0)
Lymphocytes Relative: 43.4 % (ref 12.0–46.0)
Lymphs Abs: 2.7 10*3/uL (ref 0.7–4.0)
MCHC: 32.3 g/dL (ref 30.0–36.0)
MCV: 81.9 fl (ref 78.0–100.0)
Monocytes Absolute: 0.5 10*3/uL (ref 0.1–1.0)
Monocytes Relative: 8.3 % (ref 3.0–12.0)
Neutro Abs: 2.7 10*3/uL (ref 1.4–7.7)
Neutrophils Relative %: 43.4 % (ref 43.0–77.0)
Platelets: 163 10*3/uL (ref 150.0–400.0)
RBC: 5.13 Mil/uL (ref 4.22–5.81)
RDW: 13.7 % (ref 11.5–15.5)
WBC: 6.3 10*3/uL (ref 4.0–10.5)

## 2022-01-24 LAB — PSA: PSA: 0.86 ng/mL (ref 0.10–4.00)

## 2022-01-24 LAB — LIPID PANEL
Cholesterol: 194 mg/dL (ref 0–200)
HDL: 63.6 mg/dL (ref >39.00)
LDL Cholesterol: 114 mg/dL — ABNORMAL HIGH (ref 0–99)
NonHDL: 130.34
Total CHOL/HDL Ratio: 3
Triglycerides: 83 mg/dL (ref 0.0–149.0)
VLDL: 16.6 mg/dL (ref 0.0–40.0)

## 2022-01-24 LAB — URIC ACID: Uric Acid, Serum: 7.7 mg/dL (ref 4.0–7.8)

## 2022-01-24 LAB — COMPREHENSIVE METABOLIC PANEL
ALT: 14 U/L (ref 0–53)
AST: 17 U/L (ref 0–37)
Albumin: 4.5 g/dL (ref 3.5–5.2)
Alkaline Phosphatase: 54 U/L (ref 39–117)
BUN: 15 mg/dL (ref 6–23)
CO2: 28 mEq/L (ref 19–32)
Calcium: 9.6 mg/dL (ref 8.4–10.5)
Chloride: 104 mEq/L (ref 96–112)
Creatinine, Ser: 1.19 mg/dL (ref 0.40–1.50)
GFR: 64.76 mL/min (ref >60.00)
Glucose, Bld: 113 mg/dL — ABNORMAL HIGH (ref 70–99)
Potassium: 3.6 mEq/L (ref 3.5–5.1)
Sodium: 141 mEq/L (ref 135–145)
Total Bilirubin: 0.7 mg/dL (ref 0.2–1.2)
Total Protein: 7.3 g/dL (ref 6.0–8.3)

## 2022-01-24 LAB — HEMOGLOBIN A1C: Hgb A1c MFr Bld: 6.1 % (ref 4.6–6.5)

## 2022-01-24 NOTE — Telephone Encounter (Signed)
Name of Medication: Hydrocodone-APAP Name of Pharmacy: [place] or Written Date and Quantity: 10/16/21, #40 Last Office Visit and Type: 10/16/21, chronic pain mgmt Next Office Visit and Type: 01/31/22, CPE Last Controlled Substance Agreement Date: 06/15/19 Last UDS: 06/15/19

## 2022-01-24 NOTE — Telephone Encounter (Signed)
  Encourage patient to contact the pharmacy for refills or they can request refills through [facility]  Did the patient contact the pharmacy:  no   LAST APPOINTMENT DATE:  Please schedule appointment if longer than 1 year 10/16/2021 NEXT APPOINTMENT DATE:  MEDICATION:HYDROcodone-acetaminophen HYDROcodone-acetaminophen (NORCO) 10-325 MG tablet    Is the patient out of medication? no  If not, how much is left?1  Is this a 90 day supply: no  PHARMACY: [place], [place] AT [facility] Phone: [phone]  Fax: [phone]      Let patient know to contact pharmacy at the end of the day to make sure medication is ready.  Please notify patient to allow 48-72 hours to process

## 2022-01-25 ENCOUNTER — Ambulatory Visit (INDEPENDENT_AMBULATORY_CARE_PROVIDER_SITE_OTHER): Payer: Medicare (Managed Care)

## 2022-01-25 VITALS — Ht 74.0 in | Wt 264.0 lb

## 2022-01-25 DIAGNOSIS — Z1211 Encounter for screening for malignant neoplasm of colon: Secondary | ICD-10-CM

## 2022-01-25 DIAGNOSIS — Z0000 Encounter for general adult medical examination without abnormal findings: Secondary | ICD-10-CM
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
## 2022-01-26 MED ORDER — HYDROCODONE-ACETAMINOPHEN 10-325 MG PO TABS: 1.0000 | ORAL_TABLET | Freq: Three times a day (TID) | ORAL | 0 refills | Status: DC | PRN

## 2022-01-26 NOTE — Telephone Encounter (Signed)
ERx 

## 2022-01-31 ENCOUNTER — Ambulatory Visit (INDEPENDENT_AMBULATORY_CARE_PROVIDER_SITE_OTHER): Payer: Medicare (Managed Care) | Admitting: Family Medicine

## 2022-01-31 ENCOUNTER — Encounter: Payer: Self-pay | Admitting: Family Medicine

## 2022-01-31 VITALS — BP 142/80 | HR 70 | Temp 97.1°F | Ht 73.5 in | Wt 253.0 lb

## 2022-01-31 DIAGNOSIS — G8929 Other chronic pain: Secondary | ICD-10-CM | POA: Diagnosis not present

## 2022-01-31 DIAGNOSIS — J439 Emphysema, unspecified: Secondary | ICD-10-CM | POA: Diagnosis not present

## 2022-01-31 DIAGNOSIS — I700 Atherosclerosis of aorta: Secondary | ICD-10-CM

## 2022-01-31 DIAGNOSIS — M545 Low back pain, unspecified: Secondary | ICD-10-CM | POA: Diagnosis not present

## 2022-01-31 DIAGNOSIS — R7303 Prediabetes: Secondary | ICD-10-CM

## 2022-01-31 DIAGNOSIS — I10 Essential (primary) hypertension: Secondary | ICD-10-CM

## 2022-01-31 DIAGNOSIS — E785 Hyperlipidemia, unspecified: Secondary | ICD-10-CM

## 2022-01-31 DIAGNOSIS — M1A0210 Idiopathic chronic gout, right elbow, without tophus (tophi): Secondary | ICD-10-CM

## 2022-01-31 DIAGNOSIS — Z0000 Encounter for general adult medical examination without abnormal findings: Secondary | ICD-10-CM | POA: Diagnosis not present

## 2022-01-31 DIAGNOSIS — I2510 Atherosclerotic heart disease of native coronary artery without angina pectoris: Secondary | ICD-10-CM | POA: Diagnosis not present

## 2022-01-31 DIAGNOSIS — E669 Obesity, unspecified: Secondary | ICD-10-CM | POA: Diagnosis not present

## 2022-01-31 DIAGNOSIS — Z736 Limitation of activities due to disability: Secondary | ICD-10-CM | POA: Diagnosis not present

## 2022-01-31 DIAGNOSIS — K219 Gastro-esophageal reflux disease without esophagitis: Secondary | ICD-10-CM | POA: Diagnosis not present

## 2022-01-31 DIAGNOSIS — L989 Disorder of the skin and subcutaneous tissue, unspecified: Secondary | ICD-10-CM

## 2022-01-31 DIAGNOSIS — E66811 Obesity, class 1: Secondary | ICD-10-CM

## 2022-01-31 MED ORDER — CLOBETASOL PROPIONATE 0.05 % EX CREA: 1.0000 | TOPICAL_CREAM | Freq: Two times a day (BID) | CUTANEOUS | 0 refills | Status: AC

## 2022-01-31 NOTE — Assessment & Plan Note (Addendum)
Chronic, stable period on crestor '5mg'$  daily, discussed LDL above goal, reviewed diet choices to improve LDL levels. Consider increased statin dose. The 10-year ASCVD risk score (Arnett DK, et al., 2019) is: 17.3%   Values used to calculate the score:     Age: 66 years     Sex: Male     Is Non-Hispanic African American: Yes     Diabetic: No     Tobacco smoker: No     Systolic Blood Pressure: 488 mmHg     Is BP treated: Yes     HDL Cholesterol: 63.6 mg/dL     Total Cholesterol: 194 mg/dL

## 2022-01-31 NOTE — Assessment & Plan Note (Signed)
Stable period on PRN colchicine. Urate levels elevated however no recent gout flare in several years.

## 2022-01-31 NOTE — Assessment & Plan Note (Signed)
Of unclear cause. ?postinflammatory scarred nodules - try topical steroid for 2 wks.

## 2022-01-31 NOTE — Patient Instructions (Addendum)
Find out date of 2nd shingles shot and let me know.  Buy TENS pads for TENS unit at home.  For spots on skin - try steroid cream sent to pharmacy. Apply to affected area daily for 2 weeks at a time.  Good to see you today.  Return in 3 months for chronic pain follow up visit.   Health Maintenance, Male Adopting a healthy lifestyle and getting preventive care are important in promoting health and wellness. Ask your health care provider about: The right schedule for you to have regular tests and exams. Things you can do on your own to prevent diseases and keep yourself healthy. What should I know about diet, weight, and exercise? Eat a healthy diet  Eat a diet that includes plenty of vegetables, fruits, low-fat dairy products, and lean protein. Do not eat a lot of foods that are high in solid fats, added sugars, or sodium. Maintain a healthy weight Body mass index (BMI) is a measurement that can be used to identify possible weight problems. It estimates body fat based on height and weight. Your health care provider can help determine your BMI and help you achieve or maintain a healthy weight. Get regular exercise Get regular exercise. This is one of the most important things you can do for your health. Most adults should: Exercise for at least 150 minutes each week. The exercise should increase your heart rate and make you sweat (moderate-intensity exercise). Do strengthening exercises at least twice a week. This is in addition to the moderate-intensity exercise. Spend less time sitting. Even light physical activity can be beneficial. Watch cholesterol and blood lipids Have your blood tested for lipids and cholesterol at 64 years of age, then have this test every 5 years. You may need to have your cholesterol levels checked more often if: Your lipid or cholesterol levels are high. You are older than 64 years of age. You are at high risk for heart disease. What should I know about cancer  screening? Many types of cancers can be detected early and may often be prevented. Depending on your health history and family history, you may need to have cancer screening at various ages. This may include screening for: Colorectal cancer. Prostate cancer. Skin cancer. Lung cancer. What should I know about heart disease, diabetes, and high blood pressure? Blood pressure and heart disease High blood pressure causes heart disease and increases the risk of stroke. This is more likely to develop in people who have high blood pressure readings or are overweight. Talk with your health care provider about your target blood pressure readings. Have your blood pressure checked: Every 3-5 years if you are 64 years of age. Every year if you are 18 years old or older. If you are between the ages of 24 and 33 and are a current or former smoker, ask your health care provider if you should have a one-time screening for abdominal aortic aneurysm (AAA). Diabetes Have regular diabetes screenings. This checks your fasting blood sugar level. Have the screening done: Once every three years after age 30 if you are at a normal weight and have a low risk for diabetes. More often and at a younger age if you are overweight or have a high risk for diabetes. What should I know about preventing infection? Hepatitis B If you have a higher risk for hepatitis B, you should be screened for this virus. Talk with your health care provider to find out if you are at risk for hepatitis  B infection. Hepatitis C Blood testing is recommended for: Everyone born from [birth year] through 1963. Anyone with known risk factors for hepatitis C. Sexually transmitted infections (STIs) You should be screened each year for STIs, including gonorrhea and chlamydia, if: You are sexually active and are younger than 64 years of age. You are older than 64 years of age and your health care provider tells you that you are at risk for this type of  infection. Your sexual activity has changed since you were last screened, and you are at increased risk for chlamydia or gonorrhea. Ask your health care provider if you are at risk. Ask your health care provider about whether you are at high risk for HIV. Your health care provider may recommend a prescription medicine to help prevent HIV infection. If you choose to take medicine to prevent HIV, you should first get tested for HIV. You should then be tested every 3 months for as long as you are taking the medicine. Follow these instructions at home: Alcohol use Do not drink alcohol if your health care provider tells you not to drink. If you drink alcohol: Limit how much you have to 0-2 drinks a day. Know how much alcohol is in your drink. In the [place] one drink equals one 12 oz bottle of beer (355 mL), one 5 oz glass of wine (148 mL), or one 1 oz glass of hard liquor (44 mL). Lifestyle Do not use any products that contain nicotine or tobacco. These products include cigarettes, chewing tobacco, and vaping devices, such as e-cigarettes. If you need help quitting, ask your health care provider. Do not use street drugs. Do not share needles. Ask your health care provider for help if you need support or information about quitting drugs. General instructions Schedule regular health, dental, and eye exams. Stay current with your vaccines. Tell your health care provider if: You often feel depressed. You have ever been abused or do not feel safe at home. Summary Adopting a healthy lifestyle and getting preventive care are important in promoting health and wellness. Follow your health care provider's instructions about healthy diet, exercising, and getting tested or screened for diseases. Follow your health care provider's instructions on monitoring your cholesterol and blood pressure. This information is not intended to replace advice given to you by your health care provider. Make sure you discuss  any questions you have with your health care provider. Document Revised: 06/13/2020 Document Reviewed: 06/13/2020 Elsevier Patient Education  [place].

## 2022-01-31 NOTE — Assessment & Plan Note (Signed)
Preventative protocols reviewed and updated unless pt declined. Discussed healthy diet and lifestyle.  

## 2022-01-31 NOTE — Assessment & Plan Note (Addendum)
Continue to encourage healthy diet and lifestyle choices for sustainable weight loss. Obesity complicated by comorbidities of gout, hyperlipidemia, CAD, HTN, prediabetes, and osteoarthritis.

## 2022-01-31 NOTE — Assessment & Plan Note (Signed)
Continue statin daily.

## 2022-01-31 NOTE — Assessment & Plan Note (Addendum)
[place] CSRS reviewed.  Ongoing pain, suboptimally managed with meds as per above. Worse in cold weather. Discussed TENS unit use.  He's not interested in longer acting opiate at this time.  Discussed increased # per month in summer months.

## 2022-01-31 NOTE — Assessment & Plan Note (Signed)
Stable period - continue to monitor sugar in diet

## 2022-01-31 NOTE — Progress Notes (Signed)
Patient ID: Rodney Charles, male    DOB: September 09, 1955, 66 y.o.   MRN: [id number]  This visit was conducted in person.  BP (!) 142/80   Pulse 70   Temp (!) 97.1 F (36.2 C) (Temporal)   Ht 6' 1.5" (1.867 m)   Wt 253 lb (114.8 kg)   SpO2 94%   BMI 32.93 kg/m   BP Readings from Last 3 Encounters:  01/31/22 (!) 142/80  10/16/21 138/70  07/14/21 136/78   CC: CPE Subjective:   HPI: Rodney Charles is a 66 y.o. male presenting on 01/31/2022 for Annual Exam [facility] pr[facility] [AWV 01/25/22].)   Saw health advisor last week for medicare wellness visit. Note reviewed.   No results found.  Flowsheet Row Clinical Support from 01/25/2022 in [facility] [place]  PHQ-2 Total Score 0          01/25/2022    9:57 AM 01/09/2021   10:15 AM 12/21/2019   10:34 AM 12/09/2018    9:52 AM 12/04/2017   11:26 AM  Fall Risk   Falls in the past year? 0 0 0 0 Yes  Comment     fell while walking down steps  Number falls in past yr: 0   0 1  Injury with Fall? 0   0 Yes  Comment     laceration to left arm  Risk for fall due to : No Fall Risks   Medication side effect   Follow up Falls prevention discussed   Falls evaluation completed;Falls prevention discussed    Medicare started 2005 due to total disability.   Chronic pain (lower back with R sciatica and L shoulder) on celebrex '200mg'$  daily, gabapentin '300mg'$  TID, and nortriptyline '25mg'$  nightly and hydrocodone 10/'325mg'$  TID PRN - tends to take more regularly in winter months given increased pain in the cold weather. He's not interested in longer acting opiate at this time. Discussed increased # per month in summer months.  Has TENS unit but ran out of pads - needs refill.   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  Colonoscopy 04/2014 WNL, rpt 5 yrs [facility]ares)  COLONOSCOPY 04/2019 - TA, rpt 5 yrs [facility] Mares) Prostate cancer screening - would like continue screening. No fmhx prostate cancer. Nocturia 2x/night.  Lung cancer  screening - started 2017, through 2021, graduated from f/u (stopped smoking 2005).  Flu shot yearly  [place] 05/2019, 06/2019, booster 02/2020  Tdap 09/2012  shingrix - 09/11/2019, states he got 2nd shot at [facility]. Advanced directives - brings copy from 2005, scanned 02/2019. This is not notarized. Would want Parke Poisson then Maretta Los and Eston Mould (daughters) to be HCPOA. No living will. Seat belt use discussed Sunscreen use discussed. No changing moles on skin.  Ex smoker quit 2005 Alcohol - none  Rec drugs - none Dentist yearly - has partial upper/lower dentures  Eye exam yearly  Bowel - no constipation  Bladder - no incontinence   Lives alone  Occupation: disability for lumbar and neck pain (~2007) Handicap placard - unable to walk >255f w/o stopping to rest Edu: HS   Activity: walks 15 min daily - less so in cold weather Diet: good water, fruits/vegetables daily     Relevant past medical, surgical, family and social history reviewed and updated as indicated. Interim medical history since our last visit reviewed. Allergies and medications reviewed and updated. Outpatient Medications Prior to Visit  Medication Sig Dispense Refill   amLODipine (NORVASC) 5  MG tablet TAKE 1 TABLET(5 MG) BY MOUTH DAILY 90 tablet 0   celecoxib (CELEBREX) 200 MG capsule TAKE 1 CAPSULE(200 MG) BY MOUTH DAILY 90 capsule 1   Colchicine (MITIGARE) 0.6 MG CAPS Take 1 capsule by mouth daily as needed (gout flare). 30 capsule 3   fluticasone (FLONASE) 50 MCG/ACT nasal spray SHAKE LIQUID AND USE 2 SPRAYS IN EACH NOSTRIL DAILY 16 g 6   gabapentin (NEURONTIN) 300 MG capsule TAKE 1 CAPSULE BY MOUTH THREE TIMES DAILY 270 capsule 1   HYDROcodone-acetaminophen (NORCO) 10-325 MG tablet Take 1 tablet by mouth every 8 (eight) hours as needed. 40 tablet 0   Menthol, Topical Analgesic, (BIOFREEZE EX) Apply topically as directed.     Multiple Vitamin (MULTIVITAMIN) tablet Take 1 tablet by mouth daily.      nortriptyline (PAMELOR) 25 MG capsule TAKE 1 CAPSULE(25 MG) BY MOUTH AT BEDTIME 90 capsule 0   omeprazole (PRILOSEC) 40 MG capsule Take 1 capsule (40 mg total) by mouth daily. 90 capsule 1   rosuvastatin (CRESTOR) 5 MG tablet TAKE 1 TABLET(5 MG) BY MOUTH DAILY 90 tablet 0   sertraline (ZOLOFT) 50 MG tablet TAKE 1 TABLET(50 MG) BY MOUTH DAILY 90 tablet 0   No facility-administered medications prior to visit.     Per HPI unless specifically indicated in ROS section below Review of Systems  Constitutional:  Negative for activity change, appetite change, chills, fatigue, fever and unexpected weight change.  HENT:  Negative for hearing loss.   Eyes:  Negative for visual disturbance.  Respiratory:  Negative for cough, chest tightness, shortness of breath and wheezing.   Cardiovascular:  Negative for chest pain, palpitations and leg swelling.  Gastrointestinal:  Negative for abdominal distention, abdominal pain, blood in stool, constipation, diarrhea, nausea and vomiting.  Genitourinary:  Negative for difficulty urinating and hematuria.  Musculoskeletal:  Negative for arthralgias, myalgias and neck pain.  Skin:  Negative for rash.  Neurological:  Negative for dizziness, seizures, syncope and headaches.  Hematological:  Negative for adenopathy. Does not bruise/bleed easily.  Psychiatric/Behavioral:  Negative for dysphoric mood. The patient is not nervous/anxious.     Objective:  BP (!) 142/80   Pulse 70   Temp (!) 97.1 F (36.2 C) (Temporal)   Ht 6' 1.5" (1.867 m)   Wt 253 lb (114.8 kg)   SpO2 94%   BMI 32.93 kg/m   Wt Readings from Last 3 Encounters:  01/31/22 253 lb (114.8 kg)  01/25/22 264 lb (119.7 kg)  10/16/21 264 lb 4 oz (119.9 kg)      Physical Exam Vitals and nursing note reviewed.  Constitutional:      General: He is not in acute distress.    Appearance: Normal appearance. He is well-developed. He is not ill-appearing.  HENT:     Head: Normocephalic and atraumatic.      Right Ear: Hearing, tympanic membrane, ear canal and external ear normal.     Left Ear: Hearing, tympanic membrane, ear canal and external ear normal.     Mouth/Throat:     Comments: Wearing mask Eyes:     General: No scleral icterus.    Extraocular Movements: Extraocular movements intact.     Conjunctiva/sclera: Conjunctivae normal.     Pupils: Pupils are equal, round, and reactive to light.  Neck:     Thyroid: No thyroid mass or thyromegaly.     Vascular: No carotid bruit.  Cardiovascular:     Rate and Rhythm: Normal rate and regular rhythm.  Pulses: Normal pulses.          Radial pulses are 2+ on the right side and 2+ on the left side.     Heart sounds: Normal heart sounds. No murmur heard. Pulmonary:     Effort: Pulmonary effort is normal. No respiratory distress.     Breath sounds: Normal breath sounds. No wheezing, rhonchi or rales.  Abdominal:     General: Bowel sounds are normal. There is no distension.     Palpations: Abdomen is soft. There is no mass.     Tenderness: There is no abdominal tenderness. There is no guarding or rebound.     Hernia: No hernia is present.  Musculoskeletal:        General: Normal range of motion.     Cervical back: Normal range of motion and neck supple.     Right lower leg: No edema.     Left lower leg: No edema.  Lymphadenopathy:     Cervical: No cervical adenopathy.  Skin:    General: Skin is warm and dry.     Findings: Lesion present. No rash.     Comments: ~1.5cm diameter hyperpigmented nodules x2 to abdomen and another 2 to left upper extremity  Neurological:     General: No focal deficit present.     Mental Status: He is alert and oriented to person, place, and time.  Psychiatric:        Mood and Affect: Mood normal.        Behavior: Behavior normal.        Thought Content: Thought content normal.        Judgment: Judgment normal.       Results for orders placed or performed in visit on 01/24/22  CBC with  Differential/Platelet  Result Value Ref Range   WBC 6.3 4.0 - 10.5 K/uL   RBC 5.13 4.22 - 5.81 Mil/uL   Hemoglobin 13.6 13.0 - 17.0 g/dL   HCT 42.0 39.0 - 52.0 %   MCV 81.9 78.0 - 100.0 fl   MCHC 32.3 30.0 - 36.0 g/dL   RDW 13.7 11.5 - 15.5 %   Platelets 163.0 150.0 - 400.0 K/uL   Neutrophils Relative % 43.4 43.0 - 77.0 %   Lymphocytes Relative 43.4 12.0 - 46.0 %   Monocytes Relative 8.3 3.0 - 12.0 %   Eosinophils Relative 4.2 0.0 - 5.0 %   Basophils Relative 0.7 0.0 - 3.0 %   Neutro Abs 2.7 1.4 - 7.7 K/uL   Lymphs Abs 2.7 0.7 - 4.0 K/uL   Monocytes Absolute 0.5 0.1 - 1.0 K/uL   Eosinophils Absolute 0.3 0.0 - 0.7 K/uL   Basophils Absolute 0.0 0.0 - 0.1 K/uL  Microalbumin / creatinine urine ratio  Result Value Ref Range   Microalb, Ur 1.2 0.0 - 1.9 mg/dL   Creatinine,U 195.8 mg/dL   Microalb Creat Ratio 0.6 0.0 - 30.0 mg/g  Uric acid  Result Value Ref Range   Uric Acid, Serum 7.7 4.0 - 7.8 mg/dL  PSA  Result Value Ref Range   PSA 0.86 0.10 - 4.00 ng/mL  Hemoglobin A1c  Result Value Ref Range   Hgb A1c MFr Bld 6.1 4.6 - 6.5 %  Comprehensive metabolic panel  Result Value Ref Range   Sodium 141 135 - 145 mEq/L   Potassium 3.6 3.5 - 5.1 mEq/L   Chloride 104 96 - 112 mEq/L   CO2 28 19 - 32 mEq/L   Glucose, Bld 113 (H) 70 -  99 mg/dL   BUN 15 6 - 23 mg/dL   Creatinine, Ser 1.19 0.40 - 1.50 mg/dL   Total Bilirubin 0.7 0.2 - 1.2 mg/dL   Alkaline Phosphatase 54 39 - 117 U/L   AST 17 0 - 37 U/L   ALT 14 0 - 53 U/L   Total Protein 7.3 6.0 - 8.3 g/dL   Albumin 4.5 3.5 - 5.2 g/dL   GFR 64.76 >60.00 mL/min   Calcium 9.6 8.4 - 10.5 mg/dL  Lipid panel  Result Value Ref Range   Cholesterol 194 0 - 200 mg/dL   Triglycerides 83.0 0.0 - 149.0 mg/dL   HDL 63.60 >39.00 mg/dL   VLDL 16.6 0.0 - 40.0 mg/dL   LDL Cholesterol 114 (H) 0 - 99 mg/dL   Total CHOL/HDL Ratio 3    NonHDL 130.34     Assessment & Plan:   Problem List Items Addressed This Visit     Encounter for chronic  pain management (Chronic)    [place] CSRS reviewed.  Ongoing pain, suboptimally managed with meds as per above. Worse in cold weather. Discussed TENS unit use.  He's not interested in longer acting opiate at this time.  Discussed increased # per month in summer months.       Health maintenance examination - Primary (Chronic)    Preventative protocols reviewed and updated unless pt declined. Discussed healthy diet and lifestyle.       GERD (gastroesophageal reflux disease)    Chronic, stable on omeprazole daily.      Chronic lower back pain   HLD (hyperlipidemia)    Chronic, stable period on crestor '5mg'$  daily, discussed LDL above goal, reviewed diet choices to improve LDL levels. Consider increased statin dose. The 10-year ASCVD risk score (Arnett DK, et al., 2019) is: 17.3%   Values used to calculate the score:     Age: 46 years     Sex: Male     Is Non-Hispanic African American: Yes     Diabetic: No     Tobacco smoker: No     Systolic Blood Pressure: 833 mmHg     Is BP treated: Yes     HDL Cholesterol: 63.6 mg/dL     Total Cholesterol: 194 mg/dL       Gout    Stable period on PRN colchicine. Urate levels elevated however no recent gout flare in several years.       Aortic atherosclerosis (HCC)    Continue daily statin.       CAD (coronary artery disease)    Continue statin daily.       Emphysema of lung ([place])    Asxs from respiratory standpoint.       Obesity, Class I, BMI 30-34.9    Continue to encourage healthy diet and lifestyle choices for sustainable weight loss. Obesity complicated by comorbidities of gout, hyperlipidemia, CAD, HTN, prediabetes, and osteoarthritis.       Limitation due to disability   Prediabetes    Stable period - continue to monitor sugar in diet      Primary hypertension    Chronic, stable on amlodipine '5mg'$  daily - continue.       Skin lesion    Of unclear cause. ?postinflammatory scarred nodules - try topical steroid for 2 wks.          Meds ordered this encounter  Medications   clobetasol cream (TEMOVATE) 0.05 %    Sig: Apply 1 Application topically 2 (two) times daily.  Dispense:  30 g    Refill:  0   No orders of the defined types were placed in this encounter.    Patient instructions: Find out date of 2nd shingles shot and let me know.  Buy TENS pads for TENS unit at home.  For spots on skin - try steroid cream sent to pharmacy. Apply to affected area daily for 2 weeks at a time.  Good to see you today.  Return in 3 months for chronic pain follow up visit.   Follow up plan: Return in about 3 months (around 05/02/2022) for follow up visit.  Ria Bush, MD

## 2022-01-31 NOTE — Assessment & Plan Note (Signed)
Continue daily statin. 

## 2022-01-31 NOTE — Assessment & Plan Note (Signed)
Chronic, stable on amlodipine '5mg'$  daily - continue.

## 2022-01-31 NOTE — Assessment & Plan Note (Signed)
Chronic, stable on omeprazole daily.

## 2022-01-31 NOTE — Assessment & Plan Note (Signed)
Asxs from respiratory standpoint.

## 2022-02-26 ENCOUNTER — Other Ambulatory Visit: Payer: Self-pay | Admitting: Family Medicine

## 2022-02-26 DIAGNOSIS — G8929 Other chronic pain: Secondary | ICD-10-CM

## 2022-02-26 NOTE — Telephone Encounter (Signed)
Prescription Request  02/26/2022  Is this a "Controlled Substance" medicine? No  LOV: 01/31/2022  What is the name of the medication or equipment? HYDROcodone-acetaminophen (NORCO) 10-325 MG tablet   Have you contacted your pharmacy to request a refill? No   Which pharmacy would you like this sent to?  [facility] [place], [address] AT [address] [place] [address] Phone: [phone] Fax: [phone]    Patient notified that their request is being sent to the clinical staff for review and that they should receive a response within 2 business days.   Please advise at Mobile [phone] (mobile)

## 2022-02-26 NOTE — Telephone Encounter (Signed)
Name of Medication: Hydrocodone  Name of Pharmacy: [facility] Last Fill or Written Date and Quantity: 01/26/22 #40 Last Office Visit and Type: 01/31/22 Next Office Visit and Type: 05/02/22 Last Controlled Substance Agreement Date: 06/15/19 Last UDS:01/09/21

## 2022-02-27 ENCOUNTER — Encounter: Payer: Self-pay | Admitting: Family Medicine

## 2022-02-27 MED ORDER — HYDROCODONE-ACETAMINOPHEN 10-325 MG PO TABS: 1.0000 | ORAL_TABLET | Freq: Three times a day (TID) | ORAL | 0 refills | Status: DC | PRN

## 2022-02-27 NOTE — Telephone Encounter (Signed)
ERx 

## 2022-04-04 ENCOUNTER — Other Ambulatory Visit: Payer: Self-pay | Admitting: Family Medicine

## 2022-04-04 DIAGNOSIS — G8929 Other chronic pain: Secondary | ICD-10-CM

## 2022-04-04 NOTE — Telephone Encounter (Signed)
Prescription Request  04/04/2022  Is this a "Controlled Substance" medicine? No  LOV: 01/31/2022  What is the name of the medication or equipment? HYDROcodone-acetaminophen (NORCO) 10-325 MG tablet   Have you contacted your pharmacy to request a refill? No   Which pharmacy would you like this sent to?  [facility] [place], [address] AT [address] [place] [address] Phone: [phone] Fax: [phone]    Patient notified that their request is being sent to the clinical staff for review and that they should receive a response within 2 business days.   Please advise at Mobile [phone] (mobile)

## 2022-04-04 NOTE — Telephone Encounter (Signed)
Name of Medication: Hydrocodone Name of Pharmacy: [place] or Written Date and Quantity: 02/27/22 #40 Last Office Visit and Type: 01/31/22 Next Office Visit and Type: 05/02/22 Last Controlled Substance Agreement Date: 06/15/19 Last UDS:01/09/21

## 2022-04-04 NOTE — Addendum Note (Signed)
Addended by: Pete Pelt on: 04/04/2022 05:08 PM   Modules accepted: Orders

## 2022-04-05 MED ORDER — HYDROCODONE-ACETAMINOPHEN 10-325 MG PO TABS: 1.0000 | ORAL_TABLET | Freq: Three times a day (TID) | ORAL | 0 refills | Status: DC | PRN

## 2022-04-05 NOTE — Telephone Encounter (Signed)
ERx 

## 2022-04-21 ENCOUNTER — Other Ambulatory Visit: Payer: Self-pay | Admitting: Family Medicine

## 2022-04-21 DIAGNOSIS — F39 Unspecified mood [affective] disorder: Secondary | ICD-10-CM

## 2022-04-24 ENCOUNTER — Other Ambulatory Visit: Payer: Self-pay | Admitting: Family Medicine

## 2022-04-24 NOTE — Telephone Encounter (Signed)
Not covered by insurance. Changed to omeprazole 40 mg (see 07/14/21 OV notes.) Rx sent on 07/14/21, #90/1 to [facility], [place].   Request denied.

## 2022-05-02 ENCOUNTER — Ambulatory Visit (INDEPENDENT_AMBULATORY_CARE_PROVIDER_SITE_OTHER): Payer: Medicare (Managed Care) | Admitting: Family Medicine

## 2022-05-02 ENCOUNTER — Encounter: Payer: Self-pay | Admitting: Family Medicine

## 2022-05-02 VITALS — BP 128/82 | HR 63 | Temp 97.2°F | Ht 73.5 in | Wt 265.1 lb

## 2022-05-02 DIAGNOSIS — M2550 Pain in unspecified joint: Secondary | ICD-10-CM | POA: Diagnosis not present

## 2022-05-02 DIAGNOSIS — I10 Essential (primary) hypertension: Secondary | ICD-10-CM

## 2022-05-02 DIAGNOSIS — M542 Cervicalgia: Secondary | ICD-10-CM

## 2022-05-02 DIAGNOSIS — M545 Low back pain, unspecified: Secondary | ICD-10-CM | POA: Diagnosis not present

## 2022-05-02 DIAGNOSIS — Z736 Limitation of activities due to disability: Secondary | ICD-10-CM

## 2022-05-02 DIAGNOSIS — G8929 Other chronic pain: Secondary | ICD-10-CM

## 2022-05-02 DIAGNOSIS — F39 Unspecified mood [affective] disorder: Secondary | ICD-10-CM | POA: Diagnosis not present

## 2022-05-02 MED ORDER — COLCHICINE 0.6 MG PO CAPS: 1.0000 | ORAL_CAPSULE | Freq: Every day | ORAL | 6 refills | Status: DC | PRN

## 2022-05-02 MED ORDER — AMLODIPINE BESYLATE 5 MG PO TABS: 5.0000 mg | ORAL_TABLET | Freq: Every day | ORAL | 3 refills | Status: DC

## 2022-05-02 MED ORDER — NORTRIPTYLINE HCL 25 MG PO CAPS: 25.0000 mg | ORAL_CAPSULE | Freq: Every day | ORAL | 3 refills | Status: DC

## 2022-05-02 MED ORDER — OMEPRAZOLE 40 MG PO CPDR: 40.0000 mg | DELAYED_RELEASE_CAPSULE | Freq: Every day | ORAL | 3 refills | Status: DC

## 2022-05-02 MED ORDER — HYDROCODONE-ACETAMINOPHEN 10-325 MG PO TABS: 1.0000 | ORAL_TABLET | Freq: Three times a day (TID) | ORAL | 0 refills | Status: DC | PRN

## 2022-05-02 MED ORDER — CELECOXIB 200 MG PO CAPS: ORAL_CAPSULE | ORAL | 3 refills | Status: DC

## 2022-05-02 MED ORDER — GABAPENTIN 300 MG PO CAPS: ORAL_CAPSULE | ORAL | 3 refills | Status: DC

## 2022-05-02 MED ORDER — ROSUVASTATIN CALCIUM 5 MG PO TABS: ORAL_TABLET | ORAL | 3 refills | Status: DC

## 2022-05-02 NOTE — Assessment & Plan Note (Signed)
Stable period on sertraline 50mg  daily with nortriptyline 25mg  nightly.

## 2022-05-02 NOTE — Patient Instructions (Addendum)
Prescription provided for TENS unit pads.  Hydrocodone refilled today Good to see you today.  Return in 3 months for chronic pain follow up visit.  For osteoarthritis, or wear and tear arthritis, several treatments may be beneficial: Turmeric has anti inflammatory properties similar to NSAIDs (non-steroidal anti-inflammatory drugs) and may help. If taken follow instructions on bottle. Try topical over the counter anti inflammatory creams such as diclofenac/volatren as needed. Topical analgesics containing CBD, menthol or lidocaine may also be helpful.  Compressive gloves or sleeves can be helpful especially if hurting or swelling after certain activities.  Physical therapy can help by working on a specific exercise plan for you or discussing activity modification to improve symptoms and build strength. Let me know if you'd like a referral.

## 2022-05-02 NOTE — Progress Notes (Signed)
Patient ID: Rodney Charles, male    DOB: Jun 16, 1955, 67 y.o.   MRN: [id number]  This visit was conducted in person.  BP 128/82   Pulse 63   Temp (!) 97.2 F (36.2 C) (Temporal)   Ht 6' 1.5" (1.867 m)   Wt 265 lb 2 oz (120.3 kg)   SpO2 93%   BMI 34.50 kg/m    CC:  3 month chronic pain f/u  Subjective:   HPI: Rodney Charles is a 67 y.o. male presenting on 05/02/2022 for Medical Management of Chronic Issues (Here for 3 mo chronic pain f/u.)   Chronic pain (cervical spine, lumbar spine with R sciatica as well as L shoulder pain) on celebrex 200mg  daily, gabapentin 300mg  TID, and nortriptyline 25mg  nightly and hydrocodone 10/325mg  TID PRN #40/month last filled 04/05/2022 - tends to take opiate more regularly in winter months given increased pain in the cold weather. He's not interested in longer acting opiate at this time. Previously failed ESI. Has had several prior surgeries.  Tolerating pain regimen well without constipation or sedation.  Has TENS unit but ran out of pads. Written Rx provided today.   Has 10 hydrocodone pills left.   Denies fever, cough, dyspnea or chest pain.      Relevant past medical, surgical, family and social history reviewed and updated as indicated. Interim medical history since our last visit reviewed. Allergies and medications reviewed and updated. Outpatient Medications Prior to Visit  Medication Sig Dispense Refill   clobetasol cream (TEMOVATE) [id number] % Apply 1 Application topically 2 (two) times daily. 30 g 0   fluticasone (FLONASE) 50 MCG/ACT nasal spray SHAKE LIQUID AND USE 2 SPRAYS IN EACH NOSTRIL DAILY 16 g 6   Menthol, Topical Analgesic, (BIOFREEZE EX) Apply topically as directed.     Multiple Vitamin (MULTIVITAMIN) tablet Take 1 tablet by mouth daily.     sertraline (ZOLOFT) 50 MG tablet TAKE 1 TABLET(50 MG) BY MOUTH DAILY 90 tablet 3   amLODipine (NORVASC) 5 MG tablet TAKE 1 TABLET(5 MG) BY MOUTH DAILY 90 tablet 0   celecoxib (CELEBREX) 200 MG  capsule TAKE 1 CAPSULE(200 MG) BY MOUTH DAILY 90 capsule 1   Colchicine (MITIGARE) 0.6 MG CAPS Take 1 capsule by mouth daily as needed (gout flare). 30 capsule 3   gabapentin (NEURONTIN) 300 MG capsule TAKE 1 CAPSULE BY MOUTH THREE TIMES DAILY 270 capsule 1   HYDROcodone-acetaminophen (NORCO) 10-325 MG tablet Take 1 tablet by mouth every 8 (eight) hours as needed. 40 tablet 0   nortriptyline (PAMELOR) 25 MG capsule TAKE 1 CAPSULE(25 MG) BY MOUTH AT BEDTIME 90 capsule 0   omeprazole (PRILOSEC) 40 MG capsule Take 1 capsule (40 mg total) by mouth daily. 90 capsule 1   rosuvastatin (CRESTOR) 5 MG tablet TAKE 1 TABLET(5 MG) BY MOUTH DAILY 90 tablet 0   No facility-administered medications prior to visit.     Per HPI unless specifically indicated in ROS section below Review of Systems  Objective:  BP 128/82   Pulse 63   Temp (!) 97.2 F (36.2 C) (Temporal)   Ht 6' 1.5" (1.867 m)   Wt 265 lb 2 oz (120.3 kg)   SpO2 93%   BMI 34.50 kg/m   Wt Readings from Last 3 Encounters:  05/02/22 265 lb 2 oz (120.3 kg)  01/31/22 253 lb (114.8 kg)  01/25/22 264 lb (119.7 kg)      Physical Exam Vitals and nursing note reviewed.  Constitutional:  Appearance: Normal appearance. He is not ill-appearing.     Comments: Slowed stiff movements due to joint pains  HENT:     Mouth/Throat:     Mouth: Mucous membranes are moist.     Pharynx: Oropharynx is clear. No oropharyngeal exudate or posterior oropharyngeal erythema.  Eyes:     Extraocular Movements: Extraocular movements intact.     Conjunctiva/sclera: Conjunctivae normal.     Pupils: Pupils are equal, round, and reactive to light.  Cardiovascular:     Rate and Rhythm: Normal rate and regular rhythm.     Pulses: Normal pulses.     Heart sounds: Normal heart sounds. No murmur heard. Pulmonary:     Effort: Pulmonary effort is normal. No respiratory distress.     Breath sounds: Normal breath sounds. No wheezing, rhonchi or rales.   Musculoskeletal:     Right lower leg: No edema.     Left lower leg: No edema.  Skin:    General: Skin is warm and dry.     Findings: No rash.  Neurological:     Mental Status: He is alert.  Psychiatric:        Mood and Affect: Mood normal.        Behavior: Behavior normal.       No results found for: "CKTOTAL"  Assessment & Plan:   Problem List Items Addressed This Visit     Encounter for chronic pain management - Primary (Chronic)    [place] CSRS reviewed.  Refill hydrocodone Rx.  Tolerating opiate well without side effects, benefiting from use. Continue current regimen.       Relevant Medications   HYDROcodone-acetaminophen (NORCO) 10-325 MG tablet   Mood disorder (HCC)    Stable period on sertraline 50mg  daily with nortriptyline 25mg  nightly.       Chronic lower back pain   Relevant Medications   HYDROcodone-acetaminophen (NORCO) 10-325 MG tablet   celecoxib (CELEBREX) 200 MG capsule   gabapentin (NEURONTIN) 300 MG capsule   nortriptyline (PAMELOR) 25 MG capsule   Chronic neck pain   Relevant Medications   HYDROcodone-acetaminophen (NORCO) 10-325 MG tablet   celecoxib (CELEBREX) 200 MG capsule   gabapentin (NEURONTIN) 300 MG capsule   nortriptyline (PAMELOR) 25 MG capsule   Limitation due to disability   Primary hypertension    Chronic, stable. Continue amlodipine 5mg  daily.       Relevant Medications   amLODipine (NORVASC) 5 MG tablet   rosuvastatin (CRESTOR) 5 MG tablet   Polyarthralgia     Meds ordered this encounter  Medications   HYDROcodone-acetaminophen (NORCO) 10-325 MG tablet    Sig: Take 1 tablet by mouth every 8 (eight) hours as needed.    Dispense:  40 tablet    Refill:  0   amLODipine (NORVASC) 5 MG tablet    Sig: Take 1 tablet (5 mg total) by mouth daily.    Dispense:  90 tablet    Refill:  3   celecoxib (CELEBREX) 200 MG capsule    Sig: TAKE 1 CAPSULE(200 MG) BY MOUTH DAILY    Dispense:  90 capsule    Refill:  3   Colchicine  (MITIGARE) 0.6 MG CAPS    Sig: Take 1 capsule (0.6 mg total) by mouth daily as needed (gout flare).    Dispense:  30 capsule    Refill:  6   gabapentin (NEURONTIN) 300 MG capsule    Sig: TAKE 1 CAPSULE BY MOUTH THREE TIMES DAILY    Dispense:  270 capsule    Refill:  3   nortriptyline (PAMELOR) 25 MG capsule    Sig: Take 1 capsule (25 mg total) by mouth at bedtime.    Dispense:  90 capsule    Refill:  3   omeprazole (PRILOSEC) 40 MG capsule    Sig: Take 1 capsule (40 mg total) by mouth daily.    Dispense:  90 capsule    Refill:  3   rosuvastatin (CRESTOR) 5 MG tablet    Sig: TAKE 1 TABLET(5 MG) BY MOUTH DAILY    Dispense:  90 tablet    Refill:  3    =    No orders of the defined types were placed in this encounter.   Patient Instructions  Prescription provided for TENS unit pads.  Hydrocodone refilled today Good to see you today.  Return in 3 months for chronic pain follow up visit.  For osteoarthritis, or wear and tear arthritis, several treatments may be beneficial: Turmeric has anti inflammatory properties similar to NSAIDs (non-steroidal anti-inflammatory drugs) and may help. If taken follow instructions on bottle. Try topical over the counter anti inflammatory creams such as diclofenac/volatren as needed. Topical analgesics containing CBD, menthol or lidocaine may also be helpful.  Compressive gloves or sleeves can be helpful especially if hurting or swelling after certain activities.  Physical therapy can help by working on a specific exercise plan for you or discussing activity modification to improve symptoms and build strength. Let me know if you'd like a referral.    Follow up plan: Return in about 3 months (around 08/02/2022), or if symptoms worsen or fail to improve, for follow up visit.  Ria Bush, MD

## 2022-05-02 NOTE — Assessment & Plan Note (Signed)
[place] CSRS reviewed.  Refill hydrocodone Rx.  Tolerating opiate well without side effects, benefiting from use. Continue current regimen.

## 2022-05-02 NOTE — Assessment & Plan Note (Signed)
Chronic, stable. Continue amlodipine 5mg daily. 

## 2022-06-25 ENCOUNTER — Other Ambulatory Visit: Payer: Self-pay | Admitting: Family Medicine

## 2022-06-25 DIAGNOSIS — G8929 Other chronic pain: Secondary | ICD-10-CM

## 2022-06-25 MED ORDER — HYDROCODONE-ACETAMINOPHEN 10-325 MG PO TABS: 1.0000 | ORAL_TABLET | Freq: Three times a day (TID) | ORAL | 0 refills | Status: DC | PRN

## 2022-06-25 NOTE — Telephone Encounter (Signed)
ERx 

## 2022-06-25 NOTE — Telephone Encounter (Signed)
Name of Medication: Hydrocodone Name of Pharmacy: [facility] Last Fill or Written Date and Quantity: 05/02/22 #40 Last Office Visit and Type: 05/02/22 Next Office Visit and Type: 08/03/22 Last Controlled Substance Agreement Date: 06/18/19 Last UDS:01/09/21

## 2022-06-25 NOTE — Telephone Encounter (Signed)
Prescription Request  06/25/2022  LOV: 05/02/2022  What is the name of the medication or equipment? HYDROcodone-acetaminophen (NORCO) 10-325 MG tablet   Have you contacted your pharmacy to request a refill? No   Which pharmacy would you like this sent to?  [facility] #[id number] - [place], [place] - [address] AT [facility] [address] [place] [address] Phone: [phone] Fax: [phone]    Patient notified that their request is being sent to the clinical staff for review and that they should receive a response within 2 business days.   Please advise at Mobile [phone] (mobile)

## 2022-06-28 ENCOUNTER — Other Ambulatory Visit: Payer: Self-pay | Admitting: Family Medicine

## 2022-06-28 NOTE — Telephone Encounter (Signed)
Too soon. Rx sent on 05/02/22, #90/3 to [facility].  Request denied.

## 2022-07-23 DIAGNOSIS — R351 Nocturia: Secondary | ICD-10-CM | POA: Diagnosis not present

## 2022-07-23 DIAGNOSIS — N5201 Erectile dysfunction due to arterial insufficiency: Secondary | ICD-10-CM | POA: Diagnosis not present

## 2022-07-23 DIAGNOSIS — N401 Enlarged prostate with lower urinary tract symptoms: Secondary | ICD-10-CM | POA: Diagnosis not present

## 2022-08-03 ENCOUNTER — Ambulatory Visit (INDEPENDENT_AMBULATORY_CARE_PROVIDER_SITE_OTHER): Payer: Medicare (Managed Care) | Admitting: Family Medicine

## 2022-08-03 ENCOUNTER — Encounter: Payer: Self-pay | Admitting: Family Medicine

## 2022-08-03 VITALS — BP 148/90 | HR 72 | Temp 97.5°F | Ht 73.5 in | Wt 261.5 lb

## 2022-08-03 DIAGNOSIS — M545 Low back pain, unspecified: Secondary | ICD-10-CM | POA: Diagnosis not present

## 2022-08-03 DIAGNOSIS — I10 Essential (primary) hypertension: Secondary | ICD-10-CM | POA: Diagnosis not present

## 2022-08-03 DIAGNOSIS — Z736 Limitation of activities due to disability: Secondary | ICD-10-CM

## 2022-08-03 DIAGNOSIS — M542 Cervicalgia: Secondary | ICD-10-CM | POA: Diagnosis not present

## 2022-08-03 DIAGNOSIS — G8929 Other chronic pain: Secondary | ICD-10-CM | POA: Diagnosis not present

## 2022-08-03 MED ORDER — HYDROCODONE-ACETAMINOPHEN 10-325 MG PO TABS: 1.0000 | ORAL_TABLET | Freq: Three times a day (TID) | ORAL | 0 refills | Status: DC | PRN

## 2022-08-03 NOTE — Assessment & Plan Note (Signed)
Chronic, worsening pain noted. Continue current regimen. Hydrocodone refilled. TENS pad Rx written out today - he will take to local pharmacy or medical supply store.

## 2022-08-03 NOTE — Progress Notes (Signed)
Ph: [phone] Fax: [phone]   Patient ID: Rodney Charles, male    DOB: 1955/02/13, 67 y.o.   MRN: [id number]  This visit was conducted in person.  BP (!) 148/90   Pulse 72   Temp (!) 97.5 F (36.4 C) (Temporal)   Ht 6' 1.5" (1.867 m)   Wt 261 lb 8 oz (118.6 kg)   SpO2 94%   BMI 34.03 kg/m   BP Readings from Last 3 Encounters:  08/03/22 (!) 148/90  05/02/22 128/82  01/31/22 (!) 142/80  150/96 on recheck   CC: 3 mo chronic pain f/u visit  Subjective:   HPI: Rodney Charles is a 67 y.o. male presenting on 08/03/2022 for Medical Management of Chronic Issues (Here for 3 mo chronic pain mgmt. )   Chronic pain (cervical spine, lumbar spine with R sciatica as well as L shoulder pain) on celebrex 200mg  daily, gabapentin 300mg  TID, and nortriptyline 25mg  nightly and hydrocodone 10/325mg  TID PRN #40/month last filled 06/25/2022 - tends to take opiate more regularly in winter months given increased pain in the cold weather. He's not interested in longer acting opiate. Has had several prior surgeries. Previously failed ESI. Has TENS unit.  Tolerating pain regimen well without constipation drowsiness or sedation. a  Notes worsening shooting pains down legs, neck pain, as well as left arm pain, paresthesias and numbness. No chest pain. No inciting trauma/injury or falls. No weakness of arms or legs, bowel/bladder incontinence, or fevers/chills, saddle anesthesia.   HTN - Compliant with current antihypertensive regimen of amlodipine 5mg  daily. Does not check blood pressures at home but has cuff. No low blood pressure readings or symptoms of dizziness/syncope. Denies HA, vision changes, CP/tightness, SOB, leg swelling.      Relevant past medical, surgical, family and social history reviewed and updated as indicated. Interim medical history since our last visit reviewed. Allergies and medications reviewed and updated. Outpatient Medications Prior to Visit  Medication Sig Dispense Refill    amLODipine (NORVASC) 5 MG tablet Take 1 tablet (5 mg total) by mouth daily. 90 tablet 3   celecoxib (CELEBREX) 200 MG capsule TAKE 1 CAPSULE(200 MG) BY MOUTH DAILY 90 capsule 3   clobetasol cream (TEMOVATE) 0.05 % Apply 1 Application topically 2 (two) times daily. 30 g 0   Colchicine (MITIGARE) 0.6 MG CAPS Take 1 capsule (0.6 mg total) by mouth daily as needed (gout flare). 30 capsule 6   fluticasone (FLONASE) 50 MCG/ACT nasal spray SHAKE LIQUID AND USE 2 SPRAYS IN EACH NOSTRIL DAILY 16 g 6   gabapentin (NEURONTIN) 300 MG capsule TAKE 1 CAPSULE BY MOUTH THREE TIMES DAILY 270 capsule 3   Menthol, Topical Analgesic, (BIOFREEZE EX) Apply topically as directed.     Multiple Vitamin (MULTIVITAMIN) tablet Take 1 tablet by mouth daily.     nortriptyline (PAMELOR) 25 MG capsule Take 1 capsule (25 mg total) by mouth at bedtime. 90 capsule 3   omeprazole (PRILOSEC) 40 MG capsule Take 1 capsule (40 mg total) by mouth daily. 90 capsule 3   rosuvastatin (CRESTOR) 5 MG tablet TAKE 1 TABLET(5 MG) BY MOUTH DAILY 90 tablet 3   sertraline (ZOLOFT) 50 MG tablet TAKE 1 TABLET(50 MG) BY MOUTH DAILY 90 tablet 3   HYDROcodone-acetaminophen (NORCO) 10-325 MG tablet Take 1 tablet by mouth every 8 (eight) hours as needed. 40 tablet 0   No facility-administered medications prior to visit.    Past Surgical History:  Procedure Laterality Date   CARDIOVASCULAR STRESS TEST  10/2010  normal exercise treadmill without ischemia [facility] James)   COLONOSCOPY  04/2008   tubular adenoma, rpt 5 yrs [address])   COLONOSCOPY  04/2014   WNL, rpt 5 yrs [address])   COLONOSCOPY  04/2019   TA, rpt 5 yrs [address])   LUMBAR FUSION  2005   L45 (Dr. Phoebe Perch)   NECK SURGERY  2005, 2006, 2007   posterior cervical fusion, then ACDF (Z61,09,60) (Dr. Phoebe Perch)   SHOULDER SURGERY Right 10/2013   Dr Romana Juniper   TONSILLECTOMY  1968    Per HPI unless specifically indicated in ROS section below Review of Systems  Objective:  BP (!) 148/90   Pulse 72    Temp (!) 97.5 F (36.4 C) (Temporal)   Ht 6' 1.5" (1.867 m)   Wt 261 lb 8 oz (118.6 kg)   SpO2 94%   BMI 34.03 kg/m   Wt Readings from Last 3 Encounters:  08/03/22 261 lb 8 oz (118.6 kg)  05/02/22 265 lb 2 oz (120.3 kg)  01/31/22 253 lb (114.8 kg)      Physical Exam Vitals and nursing note reviewed.  Constitutional:      Appearance: Normal appearance. He is not ill-appearing.  Neck:     Thyroid: No thyroid mass or thyromegaly.  Cardiovascular:     Rate and Rhythm: Normal rate and regular rhythm.     Pulses: Normal pulses.     Heart sounds: Normal heart sounds. No murmur heard. Pulmonary:     Effort: Pulmonary effort is normal. No respiratory distress.     Breath sounds: Normal breath sounds. No wheezing, rhonchi or rales.  Musculoskeletal:     Cervical back: Normal range of motion and neck supple.     Right lower leg: No edema.     Left lower leg: No edema.  Skin:    General: Skin is warm and dry.     Findings: No rash.  Neurological:     Mental Status: He is alert.  Psychiatric:        Mood and Affect: Mood normal.        Behavior: Behavior normal.        Assessment & Plan:   Problem List Items Addressed This Visit     Encounter for chronic pain management - Primary (Chronic)    [place] CSRS reviewed Chronic, stable period on current regimen which is effective and tolerated - continue.       Relevant Medications   HYDROcodone-acetaminophen (NORCO) 10-325 MG tablet   Chronic lower back pain    Chronic, worsening pain noted. Continue current regimen. Hydrocodone refilled. TENS pad Rx written out today - he will take to local pharmacy or medical supply store.       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 Reviewed monitoring for ankle edema if amlodipine dose increased.         Meds ordered this encounter  Medications   HYDROcodone-acetaminophen (NORCO) 10-325 MG tablet    Sig: Take 1 tablet by mouth every 8 (eight) hours as needed.    Dispense:  40 tablet    Refill:  0    No orders of  the defined types were placed in this encounter.   Patient Instructions  Continue current medicines, hydrocodone refilled. BP was elevate today - keep track of blood pressures at home using sheet provided today, drop off in 3 weeks to review.  We will titrate BP medicines as needed.   Follow up plan: Return in about 3 months (around 11/03/2022) for follow up visit.  Eustaquio Boyden, MD

## 2022-08-03 NOTE — Assessment & Plan Note (Signed)
[place] CSRS reviewed Chronic, stable period on current regimen which is effective and tolerated - continue.

## 2022-08-03 NOTE — Assessment & Plan Note (Addendum)
Chronic, BP above goal in office today despite regular amlodipine 5mg  use BP log provided to record readings at home and drop off in 3 wks to evaluate, titrate antihypertensive accordingly. Reviewed monitoring for ankle edema if amlodipine dose increased.

## 2022-08-03 NOTE — Patient Instructions (Addendum)
Continue current medicines, hydrocodone refilled. BP was elevate today - keep track of blood pressures at home using sheet provided today, drop off in 3 weeks to review.  We will titrate BP medicines as needed.

## 2022-09-18 ENCOUNTER — Other Ambulatory Visit: Payer: Self-pay | Admitting: Family Medicine

## 2022-09-18 DIAGNOSIS — G8929 Other chronic pain: Secondary | ICD-10-CM

## 2022-09-18 MED ORDER — HYDROCODONE-ACETAMINOPHEN 10-325 MG PO TABS: 1.0000 | ORAL_TABLET | Freq: Three times a day (TID) | ORAL | 0 refills | Status: DC | PRN

## 2022-09-18 NOTE — Telephone Encounter (Signed)
ERx 

## 2022-09-18 NOTE — Telephone Encounter (Signed)
Prescription Request  09/18/2022  LOV: 08/03/2022  What is the name of the medication or equipment? HYDROcodone-acetaminophen (NORCO) 10-325 MG tablet   Have you contacted your pharmacy to request a refill? No   Which pharmacy would you like this sent to?  [facility] #[id number] - [place], [place] - [address] AT [facility] [address] [place] [address] Phone: [phone] Fax: [phone]    Patient notified that their request is being sent to the clinical staff for review and that they should receive a response within 2 business days.   Please advise at Mobile [phone] (mobile)

## 2022-09-18 NOTE — Telephone Encounter (Signed)
Name of Medication:  Hydrocodone-APAP Name of Pharmacy:  [facility] Last [place] or Written Date and Quantity:  08/03/22, #40 Last Office Visit and Type:  08/03/22, 3 mo chronic pain mgmt Next Office Visit and Type:  10/29/22, 3 mo chronic pain mgmt Last Controlled Substance Agreement Date:  06/15/19 Last UDS:  06/15/19

## 2022-09-19 ENCOUNTER — Encounter: Payer: Self-pay | Admitting: Family Medicine

## 2022-09-19 NOTE — Telephone Encounter (Signed)
error 

## 2022-09-24 ENCOUNTER — Telehealth: Payer: Self-pay | Admitting: Family Medicine

## 2022-09-24 NOTE — Telephone Encounter (Signed)
Signed handicap placard and in Lisa's box.   Regarding BPs -  overall good control - continue current regimen of amlodipine 5mg  daily.   110-130s/70-80s, occ 90-100s diastolic, occ 140s systolic. BP log sent for scanning.

## 2022-09-24 NOTE — Telephone Encounter (Signed)
Patient dropped off document Handicap Placard, to be filled out by provider. Patient requested to send it back via Call Patient to pick up within ASAP. Document is located in providers tray at front office.Please advise at Mobile [phone] (mobile)  Patient also dropped off blood pressure log as requested by Dr. Sharen Hones, attached to handicap placard sheet and placed in provider's tray

## 2022-09-24 NOTE — Telephone Encounter (Signed)
Placed in your box for review.  °

## 2022-09-25 NOTE — Telephone Encounter (Signed)
Called patient and informed him that the application for his parking placard has been signed by Dr. Sharen Hones and that he may pick it up at his earliest convenience. I also informed patient of what Dr. Sharen Hones of recommendations about his BP readings. Patient verbalized understanding and all (if any) questions were answered.

## 2022-10-03 ENCOUNTER — Telehealth: Payer: Self-pay | Admitting: Family Medicine

## 2022-10-03 NOTE — Telephone Encounter (Signed)
Too soon for refill as hydrocodone last filled 09/18/2022.  Please call pharmacy to verify and see why pt got only #21 tablets when #40 were sent in.

## 2022-10-03 NOTE — Telephone Encounter (Signed)
Prescription Request  10/03/2022  LOV: 08/03/2022  What is the name of the medication or equipment? HYDROcodone-acetaminophen (NORCO) 10-325 MG tablet   Have you contacted your pharmacy to request a refill? No   Which pharmacy would you like this sent to?  [facility] #[id number] - [place], [place] - [address] AT [facility] [address] [place] [address] Phone: [phone] Fax: [phone]    Patient notified that their request is being sent to the clinical staff for review and that they should receive a response within 2 business days.   Please advise at Mobile [phone] (mobile)  Patient says on his last refill he only got 21 tablets instead of 40

## 2022-10-04 NOTE — Telephone Encounter (Signed)
Called and spoke with pharmacy, they stated patient likely only received 21 tab due to insurance. Pharmacy stated they will go ahead and fill the remaining tablets on his profile.  Called and left message advising patient of this. Advised to call with any questions.

## 2022-10-29 ENCOUNTER — Ambulatory Visit (INDEPENDENT_AMBULATORY_CARE_PROVIDER_SITE_OTHER): Payer: Medicare Other | Admitting: Family Medicine

## 2022-10-29 ENCOUNTER — Encounter: Payer: Self-pay | Admitting: Family Medicine

## 2022-10-29 VITALS — BP 136/76 | HR 87 | Temp 97.8°F | Ht 73.5 in | Wt 256.0 lb

## 2022-10-29 DIAGNOSIS — G8929 Other chronic pain: Secondary | ICD-10-CM

## 2022-10-29 DIAGNOSIS — M2550 Pain in unspecified joint: Secondary | ICD-10-CM | POA: Diagnosis not present

## 2022-10-29 DIAGNOSIS — Z23 Encounter for immunization: Secondary | ICD-10-CM | POA: Diagnosis not present

## 2022-10-29 DIAGNOSIS — M545 Low back pain, unspecified: Secondary | ICD-10-CM | POA: Diagnosis not present

## 2022-10-29 MED ORDER — HYDROCODONE-ACETAMINOPHEN 10-325 MG PO TABS: 1.0000 | ORAL_TABLET | Freq: Three times a day (TID) | ORAL | 0 refills | Status: DC | PRN

## 2022-10-29 NOTE — Assessment & Plan Note (Addendum)
[place] CSRS reviewed. Chronic, stable period on current regimen - continue.  Update UDS today.

## 2022-10-29 NOTE — Progress Notes (Signed)
Ph: [phone] Fax: [phone]   Patient ID: Rodney Charles, male    DOB: Apr 10, 1955, 67 y.o.   MRN: [id number]  This visit was conducted in person.  BP 136/76   Pulse 87   Temp 97.8 F (36.6 C) (Temporal)   Ht 6' 1.5" (1.867 m)   Wt 256 lb (116.1 kg)   SpO2 97%   BMI 33.32 kg/m    CC: 3 mo pain management f/u visit  Subjective:   HPI: Rodney Charles is a 67 y.o. male presenting on 10/29/2022 for Medical Management of Chronic Issues (Here for 3 mo pain mgmt f/u.)   Chronic pain (cervical spine, lumbar spine with R sciatica as well as L shoulder pain) on celebrex 200mg  daily, gabapentin 300mg  TID, and nortriptyline 25mg  nightly and hydrocodone 10/325mg  TID PRN #40/month last filled 09/18/2022 - tends to take opiate more regularly in winter months given increased pain in the cold weather. He's not interested in longer acting opiate. Has had several surgeries. Previously failed ESI. Has TENS unit. Tolerating pain regimen well without constipation drowsiness unsteadiness or sedation.   HTN - Compliant with current antihypertensive regimen of amlodipine 5mg  daily. Does not check blood pressures at home but has cuff. No low blood pressure readings or symptoms of dizziness/syncope. Denies HA, vision changes, CP/tightness, SOB, leg swelling.   Unable to provide urine sample today      Relevant past medical, surgical, family and social history reviewed and updated as indicated. Interim medical history since our last visit reviewed. Allergies and medications reviewed and updated. Outpatient Medications Prior to Visit  Medication Sig Dispense Refill   amLODipine (NORVASC) 5 MG tablet Take 1 tablet (5 mg total) by mouth daily. 90 tablet 3   celecoxib (CELEBREX) 200 MG capsule TAKE 1 CAPSULE(200 MG) BY MOUTH DAILY 90 capsule 3   clobetasol cream (TEMOVATE) 0.05 % Apply 1 Application topically 2 (two) times daily. 30 g 0   Colchicine (MITIGARE) 0.6 MG CAPS Take 1 capsule (0.6 mg total) by  mouth daily as needed (gout flare). 30 capsule 6   fluticasone (FLONASE) 50 MCG/ACT nasal spray SHAKE LIQUID AND USE 2 SPRAYS IN EACH NOSTRIL DAILY 16 g 6   gabapentin (NEURONTIN) 300 MG capsule TAKE 1 CAPSULE BY MOUTH THREE TIMES DAILY 270 capsule 3   Menthol, Topical Analgesic, (BIOFREEZE EX) Apply topically as directed.     Multiple Vitamin (MULTIVITAMIN) tablet Take 1 tablet by mouth daily.     nortriptyline (PAMELOR) 25 MG capsule Take 1 capsule (25 mg total) by mouth at bedtime. 90 capsule 3   omeprazole (PRILOSEC) 40 MG capsule Take 1 capsule (40 mg total) by mouth daily. 90 capsule 3   rosuvastatin (CRESTOR) 5 MG tablet TAKE 1 TABLET(5 MG) BY MOUTH DAILY 90 tablet 3   sertraline (ZOLOFT) 50 MG tablet TAKE 1 TABLET(50 MG) BY MOUTH DAILY 90 tablet 3   HYDROcodone-acetaminophen (NORCO) 10-325 MG tablet Take 1 tablet by mouth every 8 (eight) hours as needed. 40 tablet 0   No facility-administered medications prior to visit.     Per HPI unless specifically indicated in ROS section below Review of Systems  Objective:  BP 136/76   Pulse 87   Temp 97.8 F (36.6 C) (Temporal)   Ht 6' 1.5" (1.867 m)   Wt 256 lb (116.1 kg)   SpO2 97%   BMI 33.32 kg/m   Wt Readings from Last 3 Encounters:  10/29/22 256 lb (116.1 kg)  08/03/22 261 lb 8 oz (  118.6 kg)  05/02/22 265 lb 2 oz (120.3 kg)      Physical Exam Vitals and nursing note reviewed.  Constitutional:      Appearance: Normal appearance. He is not ill-appearing.  Eyes:     Conjunctiva/sclera: Conjunctivae normal.     Pupils: Pupils are equal, round, and reactive to light.  Cardiovascular:     Rate and Rhythm: Normal rate and regular rhythm.     Pulses: Normal pulses.     Heart sounds: Normal heart sounds. No murmur heard. Pulmonary:     Effort: Pulmonary effort is normal. No respiratory distress.     Breath sounds: Normal breath sounds. No wheezing, rhonchi or rales.  Musculoskeletal:     Right lower leg: No edema.     Left  lower leg: No edema.  Skin:    General: Skin is warm and dry.  Neurological:     Mental Status: He is alert.  Psychiatric:        Mood and Affect: Mood normal.        Behavior: Behavior normal.       Lab Results  Component Value Date   HGBA1C 6.1 01/24/2022    Lab Results  Component Value Date   NA 141 01/24/2022   CL 104 01/24/2022   K 3.6 01/24/2022   CO2 28 01/24/2022   BUN 15 01/24/2022   CREATININE 1.19 01/24/2022   GFR 64.76 01/24/2022   CALCIUM 9.6 01/24/2022   ALBUMIN 4.5 01/24/2022   GLUCOSE 113 (H) 01/24/2022   Assessment & Plan:   Problem List Items Addressed This Visit     Encounter for chronic pain management - Primary (Chronic)    [place] CSRS reviewed. Chronic, stable period on current regimen - continue.  Update UDS today.       Relevant Medications   HYDROcodone-acetaminophen (NORCO) 10-325 MG tablet   Chronic lower back pain   Relevant Medications   HYDROcodone-acetaminophen (NORCO) 10-325 MG tablet   Polyarthralgia   Other Visit Diagnoses     Encounter for immunization       Relevant Orders   Flu vaccine trivalent PF, 6mos and older(Flulaval,Afluria,Fluarix,Fluzone) (Completed)        Meds ordered this encounter  Medications   HYDROcodone-acetaminophen (NORCO) 10-325 MG tablet    Sig: Take 1 tablet by mouth every 8 (eight) hours as needed.    Dispense:  40 tablet    Refill:  0    Chronic pain medication    Orders Placed This Encounter  Procedures   Flu vaccine trivalent PF, 6mos and older(Flulaval,Afluria,Fluarix,Fluzone)    Patient Instructions  Flu shot today  You are doing well today. Continue current medicines. Hydrocodone refilled today.  Return in 3 months for physical/wellness visit  Follow up plan: Return in about 3 months (around 01/28/2023) for annual exam, prior fasting for blood work, medicare wellness visit.  Eustaquio Boyden, MD

## 2022-10-29 NOTE — Patient Instructions (Addendum)
Flu shot today  You are doing well today. Continue current medicines. Hydrocodone refilled today.  Return in 3 months for physical/wellness visit

## 2022-11-07 ENCOUNTER — Encounter: Payer: Self-pay | Admitting: Family Medicine

## 2022-11-07 ENCOUNTER — Ambulatory Visit (INDEPENDENT_AMBULATORY_CARE_PROVIDER_SITE_OTHER): Payer: Medicare Other | Admitting: Family Medicine

## 2022-11-07 VITALS — BP 132/70 | HR 79 | Temp 99.0°F | Ht 73.5 in | Wt 257.2 lb

## 2022-11-07 DIAGNOSIS — J209 Acute bronchitis, unspecified: Secondary | ICD-10-CM | POA: Insufficient documentation

## 2022-11-07 DIAGNOSIS — J440 Chronic obstructive pulmonary disease with acute lower respiratory infection: Secondary | ICD-10-CM | POA: Diagnosis not present

## 2022-11-07 DIAGNOSIS — R0989 Other specified symptoms and signs involving the circulatory and respiratory systems: Secondary | ICD-10-CM | POA: Diagnosis not present

## 2022-11-07 LAB — POCT INFLUENZA A/B
Influenza A, POC: NEGATIVE
Influenza B, POC: NEGATIVE

## 2022-11-07 LAB — POC COVID19 BINAXNOW: SARS Coronavirus 2 Ag: NEGATIVE

## 2022-11-07 MED ORDER — DOXYCYCLINE HYCLATE 100 MG PO TABS: 100.0000 mg | ORAL_TABLET | Freq: Two times a day (BID) | ORAL | 0 refills | Status: DC

## 2022-11-07 MED ORDER — PREDNISONE 20 MG PO TABS: ORAL_TABLET | ORAL | 0 refills | Status: DC

## 2022-11-07 MED ORDER — ALBUTEROL SULFATE HFA 108 (90 BASE) MCG/ACT IN AERS: 2.0000 | INHALATION_SPRAY | RESPIRATORY_TRACT | 0 refills | Status: DC | PRN

## 2022-11-07 NOTE — Patient Instructions (Addendum)
I think you have a viral upper respiratory infection with bronchitis   Take prednisone as directed for breathing and sinus congestion  It may make you feel hyper and hungry  The albuterol inhaler will help breathing also - use 2 puffs up to every 4 hours as needed for wheezing   Take doxycycline as directed as well   Mucinex dm is good for cough and congestion    Drink lots of fluids   Update if not starting to improve in a week or if worsening    If any severe symptoms like trouble breathing, go to the ER
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
## 2022-11-07 NOTE — Assessment & Plan Note (Signed)
Viral bronchitis with some headache/nasal symptoms and low grade temp  Has reactive airways (with copd) as well  Rhonchi on exam,  pulse ox is good  Covid and flu tests are negative today   Discussed symptom care-see AVS  Prescription prednisone 60 mg taper for wheezing and congestion -spent time discussing side effects (he has taken before)  Doxycycline in light of copd and pna risk Albuterol inhaler- spent time discussing way to use and handout given   Update if not starting to improve in a week or if worsening  Call back and Er precautions noted in detail today

## 2022-12-19 ENCOUNTER — Telehealth: Payer: Self-pay | Admitting: Family Medicine

## 2022-12-19 DIAGNOSIS — G8929 Other chronic pain: Secondary | ICD-10-CM

## 2022-12-19 MED ORDER — HYDROCODONE-ACETAMINOPHEN 10-325 MG PO TABS: 1.0000 | ORAL_TABLET | Freq: Three times a day (TID) | ORAL | 0 refills | Status: DC | PRN

## 2022-12-19 NOTE — Telephone Encounter (Signed)
Please advise, see below.   Last OV: 10/29/2022 Next OV: 02/13/2023

## 2022-12-19 NOTE — Addendum Note (Signed)
Addended by: Eustaquio Boyden on: 12/19/2022 05:17 PM   Modules accepted: Orders

## 2022-12-19 NOTE — Telephone Encounter (Signed)
ERx 

## 2022-12-19 NOTE — Telephone Encounter (Signed)
Prescription Request  12/19/2022  LOV: 10/29/2022  What is the name of the medication or equipment? HYDROcodone-acetaminophen (NORCO) 10-325 MG tablet   Have you contacted your pharmacy to request a refill? No   Which pharmacy would you like this sent to?  [id number] - [place], [place] - [address] AT [facility] [address] [place] [address] Phone: [phone] Fax: [phone]    Patient notified that their request is being sent to the clinical staff for review and that they should receive a response within 2 business days.   Please advise at [facility] [phone]

## 2022-12-25 ENCOUNTER — Other Ambulatory Visit: Payer: Self-pay | Admitting: Family Medicine

## 2022-12-25 DIAGNOSIS — J439 Emphysema, unspecified: Secondary | ICD-10-CM

## 2022-12-26 NOTE — Telephone Encounter (Signed)
Albuterol inh Last rx:  11/07/22, #1 ea Last OV:  10/29/22, 3 mo chronic pain mgmt Next OV:  02/13/23, CPE

## 2023-01-21 ENCOUNTER — Other Ambulatory Visit: Payer: Self-pay | Admitting: Family Medicine

## 2023-01-21 DIAGNOSIS — G8929 Other chronic pain: Secondary | ICD-10-CM

## 2023-01-21 NOTE — Telephone Encounter (Signed)
Prescription Request  01/21/2023  LOV: 10/29/2022  What is the name of the medication or equipment? HYDROcodone-acetaminophen (NORCO) 10-325 MG tablet   Have you contacted your pharmacy to request a refill? No   Which pharmacy would you like this sent to?  [facility][id number] - [place], [place] - [address] AT [facility] [address] [place] [address] Phone: [phone] Fax: [phone]    Patient notified that their request is being sent to the clinical staff for review and that they should receive a response within 2 business days.   Please advise at Mobile [phone] (mobile)

## 2023-01-21 NOTE — Telephone Encounter (Signed)
Name of Medication:  Hydrocodone-APAP Name of Pharmacy:  [facility] Last [place] or Written Date and Quantity:  12/19/22, #40 Last Office Visit and Type:  10/29/22, 3 mo chronic pain mgmt Next Office Visit and Type:  02/13/23, CPE Last Controlled Substance Agreement Date:  06/15/19 Last UDS:  06/15/19

## 2023-01-22 MED ORDER — HYDROCODONE-ACETAMINOPHEN 10-325 MG PO TABS: 1.0000 | ORAL_TABLET | Freq: Three times a day (TID) | ORAL | 0 refills | Status: DC | PRN

## 2023-01-22 NOTE — Telephone Encounter (Signed)
ERx 

## 2023-01-24 ENCOUNTER — Ambulatory Visit: Payer: Medicare Other

## 2023-01-24 VITALS — Ht 74.0 in | Wt 257.0 lb

## 2023-01-24 DIAGNOSIS — Z0000 Encounter for general adult medical examination without abnormal findings: Secondary | ICD-10-CM | POA: Diagnosis not present
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
## 2023-01-24 NOTE — Patient Instructions (Signed)
Mr. Conrow , Thank you for taking time to come for your Medicare Wellness Visit. I appreciate your ongoing commitment to your health goals. Please review the following plan we discussed and let me know if I can assist you in the future.   Referrals/Orders/Follow-Ups/Clinician Recommendations: none  This is a list of the screening recommended for you and due dates:  Health Maintenance  Topic Date Due   Pneumonia Vaccine (1 of 2 - PCV) Never done   Zoster (Shingles) Vaccine (2 of 2) 11/06/2019   DTaP/Tdap/Td vaccine (2 - Td or Tdap) 09/10/2022   COVID-19 Vaccine (4 - 2024-25 season) 11/23/2023*   HIV Screening  11/30/2048*   Medicare Annual Wellness Visit  01/24/2024   Colon Cancer Screening  04/26/2024   Flu Shot  Completed   Hepatitis C Screening  Completed   HPV Vaccine  Aged Out  *Topic was postponed. The date shown is not the original due date.    Advanced directives: (Copy Requested) Please bring a copy of your health care power of attorney and living will to the office to be added to your chart at your convenience.  Next Medicare Annual Wellness Visit scheduled for next year: Yes 12/22/5 @ 8:50am telephone

## 2023-02-01 ENCOUNTER — Other Ambulatory Visit: Payer: Self-pay | Admitting: Family Medicine

## 2023-02-01 DIAGNOSIS — Z125 Encounter for screening for malignant neoplasm of prostate: Secondary | ICD-10-CM

## 2023-02-01 DIAGNOSIS — I10 Essential (primary) hypertension: Secondary | ICD-10-CM

## 2023-02-01 DIAGNOSIS — M1A0210 Idiopathic chronic gout, right elbow, without tophus (tophi): Secondary | ICD-10-CM

## 2023-02-01 DIAGNOSIS — E785 Hyperlipidemia, unspecified: Secondary | ICD-10-CM

## 2023-02-01 DIAGNOSIS — R7303 Prediabetes: Secondary | ICD-10-CM

## 2023-02-07 ENCOUNTER — Other Ambulatory Visit (INDEPENDENT_AMBULATORY_CARE_PROVIDER_SITE_OTHER): Payer: Medicare Other

## 2023-02-07 DIAGNOSIS — Z125 Encounter for screening for malignant neoplasm of prostate: Secondary | ICD-10-CM

## 2023-02-07 DIAGNOSIS — R7303 Prediabetes: Secondary | ICD-10-CM | POA: Diagnosis not present

## 2023-02-07 DIAGNOSIS — E785 Hyperlipidemia, unspecified: Secondary | ICD-10-CM | POA: Diagnosis not present

## 2023-02-07 DIAGNOSIS — I10 Essential (primary) hypertension: Secondary | ICD-10-CM

## 2023-02-07 DIAGNOSIS — M1A0210 Idiopathic chronic gout, right elbow, without tophus (tophi): Secondary | ICD-10-CM | POA: Diagnosis not present

## 2023-02-07 LAB — COMPREHENSIVE METABOLIC PANEL
ALT: 21 U/L (ref 0–53)
AST: 21 U/L (ref 0–37)
Albumin: 4.6 g/dL (ref 3.5–5.2)
Alkaline Phosphatase: 58 U/L (ref 39–117)
BUN: 14 mg/dL (ref 6–23)
CO2: 25 meq/L (ref 19–32)
Calcium: 9.1 mg/dL (ref 8.4–10.5)
Chloride: 105 meq/L (ref 96–112)
Creatinine, Ser: 1.11 mg/dL (ref 0.40–1.50)
GFR: 69.89 mL/min (ref >60.00)
Glucose, Bld: 120 mg/dL — ABNORMAL HIGH (ref 70–99)
Potassium: 3.4 meq/L — ABNORMAL LOW (ref 3.5–5.1)
Sodium: 141 meq/L (ref 135–145)
Total Bilirubin: 0.5 mg/dL (ref 0.2–1.2)
Total Protein: 7.1 g/dL (ref 6.0–8.3)

## 2023-02-07 LAB — URIC ACID: Uric Acid, Serum: 6.8 mg/dL (ref 4.0–7.8)

## 2023-02-07 LAB — LIPID PANEL
Cholesterol: 209 mg/dL — ABNORMAL HIGH (ref 0–200)
HDL: 69 mg/dL (ref >39.00)
LDL Cholesterol: 123 mg/dL — ABNORMAL HIGH (ref 0–99)
NonHDL: 140.07
Total CHOL/HDL Ratio: 3
Triglycerides: 85 mg/dL (ref 0.0–149.0)
VLDL: 17 mg/dL (ref 0.0–40.0)

## 2023-02-07 LAB — MICROALBUMIN / CREATININE URINE RATIO
Creatinine,U: 275.4 mg/dL
Microalb Creat Ratio: 0.6 mg/g (ref 0.0–30.0)
Microalb, Ur: 1.7 mg/dL (ref 0.0–1.9)

## 2023-02-07 LAB — PSA: PSA: 0.82 ng/mL (ref 0.10–4.00)

## 2023-02-07 LAB — HEMOGLOBIN A1C: Hgb A1c MFr Bld: 6.1 % (ref 4.6–6.5)

## 2023-02-13 ENCOUNTER — Encounter: Payer: Self-pay | Admitting: Family Medicine

## 2023-02-13 ENCOUNTER — Ambulatory Visit: Payer: Medicare Other | Admitting: Family Medicine

## 2023-02-13 VITALS — BP 142/90 | HR 77 | Temp 97.9°F | Ht 73.25 in | Wt 255.1 lb

## 2023-02-13 DIAGNOSIS — M542 Cervicalgia: Secondary | ICD-10-CM | POA: Diagnosis not present

## 2023-02-13 DIAGNOSIS — I700 Atherosclerosis of aorta: Secondary | ICD-10-CM | POA: Diagnosis not present

## 2023-02-13 DIAGNOSIS — I2510 Atherosclerotic heart disease of native coronary artery without angina pectoris: Secondary | ICD-10-CM

## 2023-02-13 DIAGNOSIS — F39 Unspecified mood [affective] disorder: Secondary | ICD-10-CM

## 2023-02-13 DIAGNOSIS — R7303 Prediabetes: Secondary | ICD-10-CM | POA: Diagnosis not present

## 2023-02-13 DIAGNOSIS — Z0001 Encounter for general adult medical examination with abnormal findings: Secondary | ICD-10-CM

## 2023-02-13 DIAGNOSIS — M2550 Pain in unspecified joint: Secondary | ICD-10-CM

## 2023-02-13 DIAGNOSIS — M545 Low back pain, unspecified: Secondary | ICD-10-CM

## 2023-02-13 DIAGNOSIS — K219 Gastro-esophageal reflux disease without esophagitis: Secondary | ICD-10-CM | POA: Diagnosis not present

## 2023-02-13 DIAGNOSIS — E785 Hyperlipidemia, unspecified: Secondary | ICD-10-CM

## 2023-02-13 DIAGNOSIS — M1A0210 Idiopathic chronic gout, right elbow, without tophus (tophi): Secondary | ICD-10-CM

## 2023-02-13 DIAGNOSIS — G8929 Other chronic pain: Secondary | ICD-10-CM

## 2023-02-13 DIAGNOSIS — I10 Essential (primary) hypertension: Secondary | ICD-10-CM

## 2023-02-13 DIAGNOSIS — Z0000 Encounter for general adult medical examination without abnormal findings: Secondary | ICD-10-CM

## 2023-02-13 DIAGNOSIS — E66811 Obesity, class 1: Secondary | ICD-10-CM

## 2023-02-13 MED ORDER — AMLODIPINE BESYLATE 5 MG PO TABS: 5.0000 mg | ORAL_TABLET | Freq: Every day | ORAL | 4 refills | Status: DC

## 2023-02-13 MED ORDER — NORTRIPTYLINE HCL 25 MG PO CAPS: 25.0000 mg | ORAL_CAPSULE | Freq: Every day | ORAL | 4 refills | Status: DC

## 2023-02-13 MED ORDER — OMEPRAZOLE 40 MG PO CPDR: 40.0000 mg | DELAYED_RELEASE_CAPSULE | Freq: Every day | ORAL | 4 refills | Status: DC

## 2023-02-13 MED ORDER — SERTRALINE HCL 50 MG PO TABS: 50.0000 mg | ORAL_TABLET | Freq: Every day | ORAL | 4 refills | Status: DC

## 2023-02-13 MED ORDER — COLCHICINE 0.6 MG PO CAPS: 1.0000 | ORAL_CAPSULE | Freq: Every day | ORAL | 3 refills | Status: AC | PRN

## 2023-02-13 MED ORDER — CELECOXIB 200 MG PO CAPS: ORAL_CAPSULE | ORAL | 4 refills | Status: DC

## 2023-02-13 MED ORDER — SERTRALINE HCL 25 MG PO TABS: 25.0000 mg | ORAL_TABLET | Freq: Every day | ORAL | 4 refills | Status: DC

## 2023-02-13 MED ORDER — ROSUVASTATIN CALCIUM 10 MG PO TABS: 10.0000 mg | ORAL_TABLET | Freq: Every day | ORAL | 4 refills | Status: DC

## 2023-02-13 NOTE — Assessment & Plan Note (Signed)
[place] CSRS reviewed  ?

## 2023-02-13 NOTE — Assessment & Plan Note (Signed)
 Chronic BP mildly elevated in setting of pain.  Continue amlodipine. Consider losartan for uricosuric effect.

## 2023-02-13 NOTE — Assessment & Plan Note (Signed)
 Chronic, deteriorated. Will increase crestor  to 10mg  daily. The 10-year ASCVD risk score (Arnett DK, et al., 2019) is: 17.8%   Values used to calculate the score:     Age: 68 years     Sex: Male     Is Non-Hispanic African American: Yes     Diabetic: No     Tobacco smoker: No     Systolic Blood Pressure: 142 mmHg     Is BP treated: Yes     HDL Cholesterol: 69 mg/dL     Total Cholesterol: 209 mg/dL

## 2023-02-13 NOTE — Assessment & Plan Note (Signed)
Chronic, stable on daily PPI.  

## 2023-02-13 NOTE — Assessment & Plan Note (Signed)
 Stable period without recent gout flares, only PRN colchicine without frequent use. Continue to monitor.

## 2023-02-13 NOTE — Assessment & Plan Note (Signed)
 Preventative protocols reviewed and updated unless pt declined. Discussed healthy diet and lifestyle.

## 2023-02-13 NOTE — Patient Instructions (Addendum)
 Ask for date for 2nd shingles shot so we can update your chart.  Cholesterol was worse- increase crestor  (rosuvastatin ) to 10mg  daily.  Cut sertraline  in half (drop to 25mg  daily). New 25mg  tablets sent to pharmacy. If doing well on lower dose, after 1 month may try off this medicine.  Let me know if interested in higher nortriptyline  dose.  Continue other medicines - refilled today.  Return in 3 months for chronic pain follow up.

## 2023-02-13 NOTE — Assessment & Plan Note (Signed)
 Chronic, worse pain in winter weather.  Continue current regimen.

## 2023-02-13 NOTE — Assessment & Plan Note (Signed)
 Reviewed limiting added sugar in diet.

## 2023-02-13 NOTE — Assessment & Plan Note (Signed)
 Continue statin.

## 2023-02-13 NOTE — Assessment & Plan Note (Signed)
Continue to encourage healthy diet choices for sustainable weight loss. 

## 2023-02-13 NOTE — Assessment & Plan Note (Signed)
 Overall stable period. Agrees to try tapering dose of sertraline to 25mg  daily. If dong well after 1 month, consider stopping. Continue nortriptyline 25mg  nightly.

## 2023-02-13 NOTE — Progress Notes (Signed)
 Ph: [phone] Fax: [phone]   Patient ID: Rodney Charles, male    DOB: Dec 28, 1955, 68 y.o.   MRN: [id number]  This visit was conducted in person.  BP (!) 142/90 (BP Location: Right Arm, Cuff Size: Large)   Pulse 77   Temp 97.9 F (36.6 C) (Oral)   Ht 6' 1.25 (1.861 m)   Wt 255 lb 2 oz (115.7 kg)   SpO2 97%   BMI 33.43 kg/m    CC: CPE Subjective:   HPI: Rodney Charles is a 68 y.o. male presenting on 02/13/2023 for Annual Exam (MCR prt 2 [AWV- 01/24/23]. )   Saw health advisor 01/2023 for medicare wellness visit. Note reviewed.  Elevated BP attributed to increased pain during winter months.   No results found.  Flowsheet Row Clinical Support from 01/24/2023 in [facility] [place]  PHQ-2 Total Score 2          01/24/2023    8:56 AM 11/07/2022   10:57 AM 10/29/2022    9:52 AM 08/03/2022   10:17 AM 05/02/2022   10:28 AM  Fall Risk   Falls in the past year? 0 0 0 0 0  Number falls in past yr: 0 0     Injury with Fall? 0 0     Risk for fall due to : No Fall Risks No Fall Risks     Follow up Education provided;Falls prevention discussed        Medicare started 2005 due to total disability.    Chronic pain (cervical spine, lumbar spine with R sciatica as well as L shoulder pain) on celebrex  200mg  daily, gabapentin  300mg  TID, and nortriptyline  25mg  nightly and hydrocodone  10/325mg  TID PRN #40/month last filled 09/18/2022 - tends to take opiate more regularly in winter months given increased pain in the cold weather. He's not interested in longer acting opiate. Has had several surgeries. Previously failed ESI. Has TENS unit. Tolerating pain regimen well without constipation drowsiness unsteadiness or sedation.   HTN - doesn't check B Pat home. Continues amlodipine  5mg  daily,    Preventative: COLONOSCOPY 04/2019 - TA, rpt 5 yrs Claudio)  Prostate cancer screening - would like continue screening. No fmhx prostate cancer. Nocturia 2x/night.  Lung  cancer screening - started 2017, through 2021, graduated from f/u (stopped smoking 2005).  Flu shot yearly  COVID vaccine Pfizer 05/2019, 06/2019, booster 02/2020  Tdap 09/2012  shingrix  - 09/11/2019, states he got 2nd shot at the [employer], requested date.  Advanced directives - brings copy from 2005, scanned 02/2019. This is not notarized. Would want Jon Fischer then Glendale Caldron and Laray Gun (daughters) to be HCPOA. No living will. Seat belt use discussed Sunscreen use discussed. No changing moles on skin.  Ex smoker quit 2005  Alcohol - none  Dentist yearly - has partial upper/lower dentures  Eye exam yearly  Bowel - no constipation  Bladder - no incontinence   Lives alone  Occupation: disability for lumbar and neck pain (~2007) Handicap placard - unable to walk >259ft w/o stopping to rest Edu: HS   Activity: walks 15 min daily - less so in cold weather Diet: good water, fruits/vegetables daily     Relevant past medical, surgical, family and social history reviewed and updated as indicated. Interim medical history since our last visit reviewed. Allergies and medications reviewed and updated. Outpatient Medications Prior to Visit  Medication Sig Dispense Refill   albuterol  (VENTOLIN  HFA) 108 (90 Base) MCG/ACT inhaler INHALE 2 PUFFS  INTO THE LUNGS EVERY 4 HOURS AS NEEDED FOR WHEEZING OR SHORTNESS OF BREATH 8.5 g 1   clobetasol  cream (TEMOVATE ) 0.05 % Apply 1 Application topically 2 (two) times daily. 30 g 0   fluticasone  (FLONASE ) 50 MCG/ACT nasal spray SHAKE LIQUID AND USE 2 SPRAYS IN EACH NOSTRIL DAILY 16 g 6   gabapentin  (NEURONTIN ) 300 MG capsule TAKE 1 CAPSULE BY MOUTH THREE TIMES DAILY 270 capsule 3   HYDROcodone -acetaminophen  (NORCO) 10-325 MG tablet Take 1 tablet by mouth every 8 (eight) hours as needed. 40 tablet 0   Menthol, Topical Analgesic, (BIOFREEZE EX) Apply topically as directed.     Multiple Vitamin (MULTIVITAMIN) tablet Take 1 tablet by mouth daily.     amLODipine   (NORVASC ) 5 MG tablet Take 1 tablet (5 mg total) by mouth daily. 90 tablet 3   celecoxib  (CELEBREX ) 200 MG capsule TAKE 1 CAPSULE(200 MG) BY MOUTH DAILY 90 capsule 3   Colchicine  (MITIGARE ) 0.6 MG CAPS Take 1 capsule (0.6 mg total) by mouth daily as needed (gout flare). 30 capsule 6   nortriptyline  (PAMELOR ) 25 MG capsule Take 1 capsule (25 mg total) by mouth at bedtime. 90 capsule 3   omeprazole  (PRILOSEC) 40 MG capsule Take 1 capsule (40 mg total) by mouth daily. 90 capsule 3   rosuvastatin  (CRESTOR ) 5 MG tablet TAKE 1 TABLET(5 MG) BY MOUTH DAILY 90 tablet 3   sertraline  (ZOLOFT ) 50 MG tablet TAKE 1 TABLET(50 MG) BY MOUTH DAILY 90 tablet 3   doxycycline  (VIBRA -TABS) 100 MG tablet Take 1 tablet (100 mg total) by mouth 2 (two) times daily. 14 tablet 0   predniSONE  (DELTASONE ) 20 MG tablet Take 3 pills once daily by mouth for 3 days, then 2 pills once daily for 3 days, then 1 pill once daily for 3 days and then stop 18 tablet 0   No facility-administered medications prior to visit.     Per HPI unless specifically indicated in ROS section below Review of Systems  Constitutional:  Negative for activity change, appetite change, chills, fatigue, fever and unexpected weight change.  HENT:  Positive for congestion (when laying supine). Negative for hearing loss.   Eyes:  Negative for visual disturbance.  Respiratory:  Negative for cough, chest tightness, shortness of breath and wheezing.   Cardiovascular:  Negative for chest pain, palpitations and leg swelling.  Gastrointestinal:  Negative for abdominal distention, abdominal pain, blood in stool, constipation, diarrhea, nausea and vomiting.  Genitourinary:  Negative for difficulty urinating and hematuria.  Musculoskeletal:  Negative for arthralgias, myalgias and neck pain.  Skin:  Negative for rash.  Neurological:  Negative for dizziness, seizures, syncope and headaches.  Hematological:  Negative for adenopathy. Does not bruise/bleed easily.   Psychiatric/Behavioral:  Negative for dysphoric mood. The patient is not nervous/anxious.     Objective:  BP (!) 142/90 (BP Location: Right Arm, Cuff Size: Large)   Pulse 77   Temp 97.9 F (36.6 C) (Oral)   Ht 6' 1.25 (1.861 m)   Wt 255 lb 2 oz (115.7 kg)   SpO2 97%   BMI 33.43 kg/m   Wt Readings from Last 3 Encounters:  02/13/23 255 lb 2 oz (115.7 kg)  01/24/23 257 lb (116.6 kg)  11/07/22 257 lb 4 oz (116.7 kg)      Physical Exam Vitals and nursing note reviewed.  Constitutional:      General: He is not in acute distress.    Appearance: Normal appearance. He is well-developed. He is not ill-appearing.  HENT:     Head: Normocephalic and atraumatic.     Right Ear: Hearing, tympanic membrane, ear canal and external ear normal.     Left Ear: Hearing, tympanic membrane, ear canal and external ear normal.     Mouth/Throat:     Mouth: Mucous membranes are moist.     Pharynx: Oropharynx is clear. No oropharyngeal exudate or posterior oropharyngeal erythema.  Eyes:     General: No scleral icterus.    Extraocular Movements: Extraocular movements intact.     Conjunctiva/sclera: Conjunctivae normal.     Pupils: Pupils are equal, round, and reactive to light.  Neck:     Thyroid : No thyroid  mass or thyromegaly.  Cardiovascular:     Rate and Rhythm: Normal rate and regular rhythm.     Pulses: Normal pulses.          Radial pulses are 2+ on the right side and 2+ on the left side.     Heart sounds: Normal heart sounds. No murmur heard. Pulmonary:     Effort: Pulmonary effort is normal. No respiratory distress.     Breath sounds: Normal breath sounds. No wheezing, rhonchi or rales.  Abdominal:     General: Bowel sounds are normal. There is no distension.     Palpations: Abdomen is soft. There is no mass.     Tenderness: There is no abdominal tenderness. There is no guarding or rebound.     Hernia: No hernia is present.  Musculoskeletal:        General: Normal range of motion.      Cervical back: Normal range of motion and neck supple.     Right lower leg: No edema.     Left lower leg: No edema.  Lymphadenopathy:     Cervical: No cervical adenopathy.  Skin:    General: Skin is warm and dry.     Findings: No rash.  Neurological:     General: No focal deficit present.     Mental Status: He is alert and oriented to person, place, and time.  Psychiatric:        Mood and Affect: Mood normal.        Behavior: Behavior normal.        Thought Content: Thought content normal.        Judgment: Judgment normal.       Results for orders placed or performed in visit on 02/07/23  Microalbumin / creatinine urine ratio   Collection Time: 02/07/23  7:47 AM  Result Value Ref Range   Microalb, Ur 1.7 0.0 - 1.9 mg/dL   Creatinine,U 724.5 mg/dL   Microalb Creat Ratio 0.6 0.0 - 30.0 mg/g  Hemoglobin A1c   Collection Time: 02/07/23  7:47 AM  Result Value Ref Range   Hgb A1c MFr Bld 6.1 4.6 - 6.5 %  PSA   Collection Time: 02/07/23  7:47 AM  Result Value Ref Range   PSA 0.82 0.10 - 4.00 ng/mL  Uric acid   Collection Time: 02/07/23  7:47 AM  Result Value Ref Range   Uric Acid, Serum 6.8 4.0 - 7.8 mg/dL  Comprehensive metabolic panel   Collection Time: 02/07/23  7:47 AM  Result Value Ref Range   Sodium 141 135 - 145 mEq/L   Potassium 3.4 (L) 3.5 - 5.1 mEq/L   Chloride 105 96 - 112 mEq/L   CO2 25 19 - 32 mEq/L   Glucose, Bld 120 (H) 70 - 99 mg/dL   BUN 14 6 -  23 mg/dL   Creatinine, Ser 8.88 0.40 - 1.50 mg/dL   Total Bilirubin 0.5 0.2 - 1.2 mg/dL   Alkaline Phosphatase 58 39 - 117 U/L   AST 21 0 - 37 U/L   ALT 21 0 - 53 U/L   Total Protein 7.1 6.0 - 8.3 g/dL   Albumin 4.6 3.5 - 5.2 g/dL   GFR 30.10 >39.99 mL/min   Calcium  9.1 8.4 - 10.5 mg/dL  Lipid panel   Collection Time: 02/07/23  7:47 AM  Result Value Ref Range   Cholesterol 209 (H) 0 - 200 mg/dL   Triglycerides 14.9 0.0 - 149.0 mg/dL   HDL 30.99 >60.99 mg/dL   VLDL 82.9 0.0 - 59.9 mg/dL   LDL  Cholesterol 876 (H) 0 - 99 mg/dL   Total CHOL/HDL Ratio 3    NonHDL 140.07       01/24/2023    8:59 AM 11/07/2022   10:57 AM 10/29/2022    9:52 AM 08/03/2022   10:17 AM 05/02/2022   10:28 AM  Depression screen PHQ 2/9  Decreased Interest 1 1 2 3 1   Down, Depressed, Hopeless 1 1 2 1 1   PHQ - 2 Score 2 2 4 4 2   Altered sleeping 1 0 1 0 1  Tired, decreased energy 0 1 1 2  0  Change in appetite 0 0 0 0 0  Feeling bad or failure about yourself  1 1 2 1 1   Trouble concentrating 0 1 0 0 1  Moving slowly or fidgety/restless 0 0 0 0 0  Suicidal thoughts 0 0 0 0 0  PHQ-9 Score 4 5 8 7 5   Difficult doing work/chores Not difficult at all Somewhat difficult Somewhat difficult Somewhat difficult Somewhat difficult       11/07/2022   10:57 AM 10/29/2022    9:52 AM 08/03/2022   10:18 AM 05/02/2022   10:28 AM  GAD 7 : Generalized Anxiety Score  Nervous, Anxious, on Edge 1 2 1 1   Control/stop worrying 1 1 1 2   Worry too much - different things 1 1 1 2   Trouble relaxing 1 1 1 1   Restless 0 1 0 1  Easily annoyed or irritable 1 1 1 1   Afraid - awful might happen 0 1 0 0  Total GAD 7 Score 5 8 5 8   Anxiety Difficulty Somewhat difficult Somewhat difficult Somewhat difficult Somewhat difficult   Assessment & Plan:   Problem List Items Addressed This Visit     Encounter for chronic pain management (Chronic)   [place] CSRS reviewed.       Health maintenance examination - Primary (Chronic)   Preventative protocols reviewed and updated unless pt declined. Discussed healthy diet and lifestyle.       GERD (gastroesophageal reflux disease)   Chronic, stable on daily PPI       Relevant Medications   omeprazole  (PRILOSEC) 40 MG capsule   Mood disorder (HCC)   Overall stable period. Agrees to try tapering dose of sertraline  to 25mg  daily. If dong well after 1 month, consider stopping. Continue nortriptyline  25mg  nightly.       Relevant Medications   sertraline  (ZOLOFT ) 25 MG tablet   Chronic  lower back pain   Chronic, worse pain in winter weather.  Continue current regimen.       Relevant Medications   celecoxib  (CELEBREX ) 200 MG capsule   nortriptyline  (PAMELOR ) 25 MG capsule   sertraline  (ZOLOFT ) 25 MG tablet   Chronic neck pain  Relevant Medications   celecoxib  (CELEBREX ) 200 MG capsule   nortriptyline  (PAMELOR ) 25 MG capsule   sertraline  (ZOLOFT ) 25 MG tablet   HLD (hyperlipidemia)   Chronic, deteriorated. Will increase crestor  to 10mg  daily. The 10-year ASCVD risk score (Arnett DK, et al., 2019) is: 17.8%   Values used to calculate the score:     Age: 89 years     Sex: Male     Is Non-Hispanic African American: Yes     Diabetic: No     Tobacco smoker: No     Systolic Blood Pressure: 142 mmHg     Is BP treated: Yes     HDL Cholesterol: 69 mg/dL     Total Cholesterol: 209 mg/dL       Relevant Medications   amLODipine  (NORVASC ) 5 MG tablet   rosuvastatin  (CRESTOR ) 10 MG tablet   Gout   Stable period without recent gout flares, only PRN colchicine  without frequent use. Continue to monitor.       Relevant Medications   Colchicine  (MITIGARE ) 0.6 MG CAPS   Aortic atherosclerosis (HCC)   Continue statin.       Relevant Medications   amLODipine  (NORVASC ) 5 MG tablet   rosuvastatin  (CRESTOR ) 10 MG tablet   CAD (coronary artery disease)   Continue statin.       Relevant Medications   amLODipine  (NORVASC ) 5 MG tablet   rosuvastatin  (CRESTOR ) 10 MG tablet   Obesity, Class I, BMI 30-34.9   Continue to encourage healthy diet choices for sustainable weight loss.       Prediabetes   Reviewed limiting added sugar in diet.       Primary hypertension   Chronic BP mildly elevated in setting of pain.  Continue amlodipine . Consider losartan  for uricosuric effect.       Relevant Medications   amLODipine  (NORVASC ) 5 MG tablet   rosuvastatin  (CRESTOR ) 10 MG tablet   Polyarthralgia     Meds ordered this encounter  Medications   amLODipine  (NORVASC ) 5  MG tablet    Sig: Take 1 tablet (5 mg total) by mouth daily.    Dispense:  90 tablet    Refill:  4   celecoxib  (CELEBREX ) 200 MG capsule    Sig: TAKE 1 CAPSULE(200 MG) BY MOUTH DAILY    Dispense:  90 capsule    Refill:  4   Colchicine  (MITIGARE ) 0.6 MG CAPS    Sig: Take 1 capsule (0.6 mg total) by mouth daily as needed (gout flare).    Dispense:  30 capsule    Refill:  3   nortriptyline  (PAMELOR ) 25 MG capsule    Sig: Take 1 capsule (25 mg total) by mouth at bedtime.    Dispense:  90 capsule    Refill:  4   omeprazole  (PRILOSEC) 40 MG capsule    Sig: Take 1 capsule (40 mg total) by mouth daily.    Dispense:  90 capsule    Refill:  4   rosuvastatin  (CRESTOR ) 10 MG tablet    Sig: Take 1 tablet (10 mg total) by mouth daily. TAKE 1 TABLET(5 MG) BY MOUTH DAILY    Dispense:  90 tablet    Refill:  4    Note new dose   DISCONTD: sertraline  (ZOLOFT ) 50 MG tablet    Sig: Take 1 tablet (50 mg total) by mouth daily.    Dispense:  90 tablet    Refill:  4   sertraline  (ZOLOFT ) 25 MG tablet    Sig:  Take 1 tablet (25 mg total) by mouth daily.    Dispense:  90 tablet    Refill:  4    Note new dose    No orders of the defined types were placed in this encounter.   Patient Instructions  Ask for date for 2nd shingles shot so we can update your chart.  Cholesterol was worse- increase crestor  (rosuvastatin ) to 10mg  daily.  Cut sertraline  in half (drop to 25mg  daily). New 25mg  tablets sent to pharmacy. If doing well on lower dose, after 1 month may try off this medicine.  Let me know if interested in higher nortriptyline  dose.  Continue other medicines - refilled today.  Return in 3 months for chronic pain follow up.   Follow up plan: Return in about 3 months (around 05/14/2023) for follow up visit.  Anton Blas, MD

## 2023-02-25 ENCOUNTER — Other Ambulatory Visit: Payer: Self-pay | Admitting: Family Medicine

## 2023-02-25 DIAGNOSIS — M2550 Pain in unspecified joint: Secondary | ICD-10-CM

## 2023-02-25 NOTE — Telephone Encounter (Signed)
Name of Medication:  Hydrocodone-APAP Name of Pharmacy:  [facility] Last Loop or Written Date and Quantity:  01/22/23, #40 Last Office Visit and Type:  02/13/23, CPE Next Office Visit and Type:  05/14/23, 3 mo chronic pain mgmt f/u Last Controlled Substance Agreement Date:  06/15/19 Last UDS:  06/15/19

## 2023-02-25 NOTE — Telephone Encounter (Signed)
Copied from CRM [id number]. Topic: Clinical - Medication Refill >> Feb 25, 2023  9:07 AM Josefa Half C wrote: Most Recent Primary Care Visit:  Provider: Eustaquio Boyden  Department: [facility]  Visit Type: PHYSICAL  Date: 02/13/2023  Medication: HYDROcodone-acetaminophen (NORCO) 10-325 MG tablet  Has the patient contacted their pharmacy? No, patient called provider to initiate refill (Agent: If no, request that the patient contact the pharmacy for the refill. If patient does not wish to contact the pharmacy document the reason why and proceed with request.) (Agent: If yes, when and what did the pharmacy advise?)  Is this the correct pharmacy for this prescription? Yes If no, delete pharmacy and type the correct one.  This is the patient's preferred pharmacy:  [facility] [address], [place] - [address] AT [facility] [address] [place] [address] Phone: [phone] Fax: [phone]   Has the prescription been filled recently? No  Is the patient out of the medication? Yes  Has the patient been seen for an appointment in the last year OR does the patient have an upcoming appointment? Yes  Can we respond through MyChart? No  Agent: Please be advised that Rx refills may take up to 3 business days. We ask that you follow-up with your pharmacy.

## 2023-02-27 MED ORDER — HYDROCODONE-ACETAMINOPHEN 10-325 MG PO TABS: 1.0000 | ORAL_TABLET | Freq: Three times a day (TID) | ORAL | 0 refills | Status: DC | PRN

## 2023-02-27 NOTE — Telephone Encounter (Signed)
ERx 

## 2023-04-01 ENCOUNTER — Other Ambulatory Visit: Payer: Self-pay | Admitting: Family Medicine

## 2023-04-01 DIAGNOSIS — M2550 Pain in unspecified joint: Secondary | ICD-10-CM

## 2023-04-01 NOTE — Telephone Encounter (Signed)
 Last Fill: 02/27/23 40 tabs/0 refills  Last OV: 02/13/23 Next OV: 05/14/23  Routing to provider for review/authorization.

## 2023-04-01 NOTE — Telephone Encounter (Signed)
 Copied from CRM [id number]. Topic: Clinical - Medication Refill >> Apr 01, 2023 10:02 AM Randa Ngo wrote: Most Recent Primary Care Visit:  Provider: Eustaquio Boyden  Department: [facility]  Visit Type: PHYSICAL  Date: 02/13/2023  Medication: HYDROcodone-acetaminophen (NORCO) 10-325 MG tablet  Has the patient contacted their pharmacy? No, patient was told to call office for refill. (Agent: If no, request that the patient contact the pharmacy for the refill. If patient does not wish to contact the pharmacy document the reason why and proceed with request.) (Agent: If yes, when and what did the pharmacy advise?)  Is this the correct pharmacy for this prescription? Yes If no, delete pharmacy and type the correct one.  This is the patient's preferred pharmacy:  [facility] [address], [place] - [address] AT [facility] [address] [place] [address] Phone: [phone] Fax: [phone]   Has the prescription been filled recently? No  Is the patient out of the medication? No, patient has 2 pills left.  Has the patient been seen for an appointment in the last year OR does the patient have an upcoming appointment? Yes  Can we respond through MyChart? No  Agent: Please be advised that Rx refills may take up to 3 business days. We ask that you follow-up with your pharmacy.

## 2023-04-02 MED ORDER — HYDROCODONE-ACETAMINOPHEN 10-325 MG PO TABS: 1.0000 | ORAL_TABLET | Freq: Three times a day (TID) | ORAL | 0 refills | Status: DC | PRN

## 2023-04-02 NOTE — Telephone Encounter (Signed)
 ERx

## 2023-04-09 ENCOUNTER — Other Ambulatory Visit: Payer: Self-pay | Admitting: Family Medicine

## 2023-04-09 DIAGNOSIS — F39 Unspecified mood [affective] disorder: Secondary | ICD-10-CM

## 2023-04-09 NOTE — Telephone Encounter (Signed)
 Patient is returning a call from the office

## 2023-04-09 NOTE — Telephone Encounter (Signed)
 Per 02/13/23 OV notes, pt agreed to trying tapering dose to 25 mg daily for 1 mo, then if doing well, consider stopping.  Lvm asking pt to call back. Need to see how pt did on sertraline 25 mg and if he was able to stop med.

## 2023-04-09 NOTE — Telephone Encounter (Signed)
 Spoke with pt asking about tapering off of med. States he has not yet and wanted to try 1 more month of 25 mg, then stop. Informed pt he will just need to call [facility] and speak with someone asking them to fill the 25 mg dose. Pt verbalizes understanding and expresses his thanks. Fyi to Dr Reece Agar.

## 2023-04-11 NOTE — Telephone Encounter (Signed)
 Noted. Agree.

## 2023-05-01 ENCOUNTER — Other Ambulatory Visit: Payer: Self-pay | Admitting: Family Medicine

## 2023-05-01 DIAGNOSIS — G8929 Other chronic pain: Secondary | ICD-10-CM

## 2023-05-01 NOTE — Telephone Encounter (Signed)
 Too soon. Rx sent on 02/13/23, #90/4 refills to [facility].  Request denied.

## 2023-05-14 ENCOUNTER — Encounter: Payer: Self-pay | Admitting: Family Medicine

## 2023-05-14 ENCOUNTER — Ambulatory Visit (INDEPENDENT_AMBULATORY_CARE_PROVIDER_SITE_OTHER): Payer: Medicare Other | Admitting: Family Medicine

## 2023-05-14 VITALS — BP 134/78 | HR 68 | Temp 98.5°F | Ht 73.25 in | Wt 264.1 lb

## 2023-05-14 DIAGNOSIS — Z23 Encounter for immunization: Secondary | ICD-10-CM

## 2023-05-14 DIAGNOSIS — M2550 Pain in unspecified joint: Secondary | ICD-10-CM

## 2023-05-14 DIAGNOSIS — E785 Hyperlipidemia, unspecified: Secondary | ICD-10-CM

## 2023-05-14 DIAGNOSIS — M25512 Pain in left shoulder: Secondary | ICD-10-CM | POA: Diagnosis not present

## 2023-05-14 DIAGNOSIS — G8929 Other chronic pain: Secondary | ICD-10-CM | POA: Diagnosis not present

## 2023-05-14 DIAGNOSIS — Z736 Limitation of activities due to disability: Secondary | ICD-10-CM

## 2023-05-14 DIAGNOSIS — F39 Unspecified mood [affective] disorder: Secondary | ICD-10-CM

## 2023-05-14 MED ORDER — HYDROCODONE-ACETAMINOPHEN 10-325 MG PO TABS
1.0000 | ORAL_TABLET | Freq: Three times a day (TID) | ORAL | 0 refills | Status: DC | PRN
Start: 2023-05-14 — End: 2023-07-15

## 2023-05-14 NOTE — Assessment & Plan Note (Addendum)
 Chronic, will recheck FLP next labwork on higher rosuvastatin dose - he is tolerating this well without increased myalgias/arthralgias.  ASCVD risk = 16.1%.  Discussed further risk stratification - will order cardiac CT for calcium score.

## 2023-05-14 NOTE — Assessment & Plan Note (Addendum)
 Acute on chronic. Suspicious for developing frozen shoulder. Encouraged shoulder mobility. Rec f/u with sports medicine for further evaluation H/o left RTC injury saw ortho 2023, recommended surgery at that time but pt declined.

## 2023-05-14 NOTE — Patient Instructions (Addendum)
 Prevnar-20 today  Schedule appointment for left shoulder pain with sports medicine Dr Patsy Lager.  We will set you up for heart CT with coronary calcium score  You are doing well today  Return in 3-4 months for follow up visit

## 2023-05-14 NOTE — Progress Notes (Signed)
 Ph: [phone] Fax: [phone]   Patient ID: Rodney Charles, male    DOB: [birth year], 68 y.o.   MRN: [id number]  This visit was conducted in person.  BP 134/78   Pulse 68   Temp 98.5 F (36.9 C) (Oral)   Ht 6' 1.25" (1.861 m)   Wt 264 lb 2 oz (119.8 kg)   SpO2 96%   BMI 34.61 kg/m    CC: 3 mo f/u visit  Subjective:   HPI: Rodney Charles is a 68 y.o. male presenting on 05/14/2023 for Medical Management of Chronic Issues (Here for 3 mo pain mgmt f/u.)   Notes chronic left shoulder pain without recent falls or injury.   Chronic pain (cervical spine, lumbar spine with R sciatica as well as L shoulder pain) on celebrex 200mg  daily, gabapentin 300mg  TID, and nortriptyline 25mg  nightly. Also on hydrocodone 10/325mg  TID PRN #40/month last filled 04/02/2023 and still has #10 tablets left - tends to take opiate more regularly in winter months given increased pain in the cold weather. He's not interested in longer acting opiate. Has had several back surgeries. Previously failed ESI. Has TENS unit.   Tolerating pain regimen well without constipation drowsiness unsteadiness or sedation.   HLD - last visit we increased crestor to 10mg  daily. He feels he's tolerating this well.   Mood - last visit we started slow sertraline taper - doing well on lower dose but desires to continue this medication.      Relevant past medical, surgical, family and social history reviewed and updated as indicated. Interim medical history since our last visit reviewed. Allergies and medications reviewed and updated. Outpatient Medications Prior to Visit  Medication Sig Dispense Refill   albuterol (VENTOLIN HFA) 108 (90 Base) MCG/ACT inhaler INHALE 2 PUFFS INTO THE LUNGS EVERY 4 HOURS AS NEEDED FOR WHEEZING OR SHORTNESS OF BREATH 8.5 g 1   amLODipine (NORVASC) 5 MG tablet Take 1 tablet (5 mg total) by mouth daily. 90 tablet 4   celecoxib (CELEBREX) 200 MG capsule TAKE 1 CAPSULE(200 MG) BY MOUTH DAILY 90  capsule 4   clobetasol cream (TEMOVATE) 0.05 % Apply 1 Application topically 2 (two) times daily. 30 g 0   Colchicine (MITIGARE) 0.6 MG CAPS Take 1 capsule (0.6 mg total) by mouth daily as needed (gout flare). 30 capsule 3   fluticasone (FLONASE) 50 MCG/ACT nasal spray SHAKE LIQUID AND USE 2 SPRAYS IN EACH NOSTRIL DAILY 16 g 6   gabapentin (NEURONTIN) 300 MG capsule TAKE 1 CAPSULE BY MOUTH THREE TIMES DAILY 270 capsule 3   Menthol, Topical Analgesic, (BIOFREEZE EX) Apply topically as directed.     Multiple Vitamin (MULTIVITAMIN) tablet Take 1 tablet by mouth daily.     nortriptyline (PAMELOR) 25 MG capsule Take 1 capsule (25 mg total) by mouth at bedtime. 90 capsule 4   omeprazole (PRILOSEC) 40 MG capsule Take 1 capsule (40 mg total) by mouth daily. 90 capsule 4   rosuvastatin (CRESTOR) 10 MG tablet Take 1 tablet (10 mg total) by mouth daily. TAKE 1 TABLET(5 MG) BY MOUTH DAILY 90 tablet 4   sertraline (ZOLOFT) 25 MG tablet Take 1 tablet (25 mg total) by mouth daily. 90 tablet 4   HYDROcodone-acetaminophen (NORCO) 10-325 MG tablet Take 1 tablet by mouth every 8 (eight) hours as needed. 40 tablet 0   No facility-administered medications prior to visit.     Per HPI unless specifically indicated in ROS section below Review of Systems  Objective:  BP  134/78   Pulse 68   Temp 98.5 F (36.9 C) (Oral)   Ht 6' 1.25" (1.861 m)   Wt 264 lb 2 oz (119.8 kg)   SpO2 96%   BMI 34.61 kg/m   Wt Readings from Last 3 Encounters:  05/14/23 264 lb 2 oz (119.8 kg)  02/13/23 255 lb 2 oz (115.7 kg)  01/24/23 257 lb (116.6 kg)      Physical Exam Vitals and nursing note reviewed.  Constitutional:      Appearance: Normal appearance. He is not ill-appearing.  HENT:     Mouth/Throat:     Mouth: Mucous membranes are moist.     Pharynx: Oropharynx is clear. No oropharyngeal exudate or posterior oropharyngeal erythema.  Eyes:     Extraocular Movements: Extraocular movements intact.  Cardiovascular:      Rate and Rhythm: Normal rate and regular rhythm.     Pulses: Normal pulses.     Heart sounds: Normal heart sounds. No murmur heard. Pulmonary:     Effort: Pulmonary effort is normal. No respiratory distress.     Breath sounds: Normal breath sounds. No wheezing, rhonchi or rales.  Musculoskeletal:        General: Tenderness present.     Comments:  Marked L shoulder pain to palpation throughout with limited ROM past 90 degree, both active and passively  Skin:    General: Skin is warm and dry.     Findings: No rash.  Neurological:     Mental Status: He is alert.  Psychiatric:        Mood and Affect: Mood normal.        Behavior: Behavior normal.       Results for orders placed or performed in visit on 02/07/23  Microalbumin / creatinine urine ratio   Collection Time: 02/07/23  7:47 AM  Result Value Ref Range   Microalb, Ur 1.7 0.0 - 1.9 mg/dL   Creatinine,U 010.2 mg/dL   Microalb Creat Ratio 0.6 0.0 - 30.0 mg/g  Hemoglobin A1c   Collection Time: 02/07/23  7:47 AM  Result Value Ref Range   Hgb A1c MFr Bld 6.1 4.6 - 6.5 %  PSA   Collection Time: 02/07/23  7:47 AM  Result Value Ref Range   PSA 0.82 0.10 - 4.00 ng/mL  Uric acid   Collection Time: 02/07/23  7:47 AM  Result Value Ref Range   Uric Acid, Serum 6.8 4.0 - 7.8 mg/dL  Comprehensive metabolic panel   Collection Time: 02/07/23  7:47 AM  Result Value Ref Range   Sodium 141 135 - 145 mEq/L   Potassium 3.4 (L) 3.5 - 5.1 mEq/L   Chloride 105 96 - 112 mEq/L   CO2 25 19 - 32 mEq/L   Glucose, Bld 120 (H) 70 - 99 mg/dL   BUN 14 6 - 23 mg/dL   Creatinine, Ser 7.25 0.40 - 1.50 mg/dL   Total Bilirubin 0.5 0.2 - 1.2 mg/dL   Alkaline Phosphatase 58 39 - 117 U/L   AST 21 0 - 37 U/L   ALT 21 0 - 53 U/L   Total Protein 7.1 6.0 - 8.3 g/dL   Albumin 4.6 3.5 - 5.2 g/dL   GFR 36.64 >40.34 mL/min   Calcium 9.1 8.4 - 10.5 mg/dL  Lipid panel   Collection Time: 02/07/23  7:47 AM  Result Value Ref Range   Cholesterol 209 (H) 0 -  200 mg/dL   Triglycerides 74.2 0.0 - 149.0 mg/dL   HDL 59.56 >  39.00 mg/dL   VLDL 40.9 0.0 - 81.1 mg/dL   LDL Cholesterol 914 (H) 0 - 99 mg/dL   Total CHOL/HDL Ratio 3    NonHDL 140.07       05/14/2023   10:47 AM 01/24/2023    8:59 AM 11/07/2022   10:57 AM 10/29/2022    9:52 AM 08/03/2022   10:17 AM  Depression screen PHQ 2/9  Decreased Interest 1 1 1 2 3   Down, Depressed, Hopeless 1 1 1 2 1   PHQ - 2 Score 2 2 2 4 4   Altered sleeping 0 1 0 1 0  Tired, decreased energy 1 0 1 1 2   Change in appetite 0 0 0 0 0  Feeling bad or failure about yourself  1 1 1 2 1   Trouble concentrating 1 0 1 0 0  Moving slowly or fidgety/restless 1 0 0 0 0  Suicidal thoughts 0 0 0 0 0  PHQ-9 Score 6 4 5 8 7   Difficult doing work/chores Somewhat difficult Not difficult at all Somewhat difficult Somewhat difficult Somewhat difficult       05/14/2023   10:47 AM 11/07/2022   10:57 AM 10/29/2022    9:52 AM 08/03/2022   10:18 AM  GAD 7 : Generalized Anxiety Score  Nervous, Anxious, on Edge 1 1 2 1   Control/stop worrying 1 1 1 1   Worry too much - different things 1 1 1 1   Trouble relaxing 2 1 1 1   Restless 2 0 1 0  Easily annoyed or irritable 1 1 1 1   Afraid - awful might happen 1 0 1 0  Total GAD 7 Score 9 5 8 5   Anxiety Difficulty Somewhat difficult Somewhat difficult Somewhat difficult Somewhat difficult   Assessment & Plan:  Patient's 10-year ASCVD risk score was calculated using the ASCVD Risk Estimator from the [employer] of [employer] using current lipid data (less than 12 months old). This assessment was performed due to predisposing cardiovascular risk factor of hyperlipidemia. This is done for a patient without known pre-existing cardiovascular disease.  ASCVD 10 year risk calculated at: 16.1%  Time spent in performing the ASCVD risk assessment and discussion of results: 5 minutes BP: 134/78   The 10-year ASCVD risk score (Arnett DK, et al., 2019) is: 16.1%   Values used to calculate the  score:     Age: 4 years     Sex: Male     Is Non-Hispanic African American: Yes     Diabetic: No     Tobacco smoker: No     Systolic Blood Pressure: 134 mmHg     Is BP treated: Yes     HDL Cholesterol: 69 mg/dL     Total Cholesterol: 209 mg/dL    Problem List Items Addressed This Visit     Encounter for chronic pain management - Primary (Chronic)   [place] CSRS reviewed.  Stable period on current regimen - continue.       Mood disorder (HCC)   Overall stable period on lower sertraline dose - but desires to continue at this dose.  PHQ9/GAD7 slightly worse.  Continue nortriptyline 25mg  nightly.       HLD (hyperlipidemia)   Chronic, will recheck FLP next labwork on higher rosuvastatin dose - he is tolerating this well without increased myalgias/arthralgias.  ASCVD risk = 16.1%.  Discussed further risk stratification - will order cardiac CT for calcium score.       Relevant Orders   CT CARDIAC SCORING (SELF  PAY ONLY)   Limitation due to disability   Polyarthralgia   Relevant Medications   HYDROcodone-acetaminophen (NORCO) 10-325 MG tablet   Left shoulder pain   Acute on chronic. Suspicious for developing frozen shoulder. Encouraged shoulder mobility. Rec f/u with sports medicine for further evaluation H/o left RTC injury saw ortho 2023, recommended surgery at that time but pt declined.       Other Visit Diagnoses       Need for vaccination against Streptococcus pneumoniae       Relevant Orders   Pneumococcal conjugate vaccine 20-valent (Completed)        Meds ordered this encounter  Medications   HYDROcodone-acetaminophen (NORCO) 10-325 MG tablet    Sig: Take 1 tablet by mouth every 8 (eight) hours as needed.    Dispense:  40 tablet    Refill:  0    Chronic pain medication    Orders Placed This Encounter  Procedures   CT CARDIAC SCORING (SELF PAY ONLY)    Standing Status:   Future    Expiration Date:   05/13/2024    Scheduling Instructions:     Pt requests to  be scheduled 2nd week of May    Preferred imaging location?:   [facility]   Pneumococcal conjugate vaccine 20-valent    Patient Instructions  Prevnar-20 today  Schedule appointment for left shoulder pain with sports medicine Dr Patsy Lager.  We will set you up for heart CT with coronary calcium score  You are doing well today  Return in 3-4 months for follow up visit   Follow up plan: Return in about 3 months (around 08/13/2023) for follow up visit.  Eustaquio Boyden, MD

## 2023-05-14 NOTE — Assessment & Plan Note (Signed)
 Overall stable period on lower sertraline dose - but desires to continue at this dose.  PHQ9/GAD7 slightly worse.  Continue nortriptyline 25mg  nightly.

## 2023-05-14 NOTE — Assessment & Plan Note (Addendum)
[place] CSRS reviewed. Stable period on current regimen - continue.

## 2023-05-22 NOTE — Progress Notes (Signed)
 Rodney Bonadonna T. Kasey Hansell, MD, CAQ Sports Medicine [facility] at [facility] [address] [place], [address]  Phone: [phone]  FAX: [phone]  Rodney Charles - 68 y.o. male  MRN [id number]  Date of Birth: 03/14/1955  Date: 05/23/2023  PCP: [name], MD  Referral: [name], MD  Chief Complaint  Patient presents with   Left Shoulder Pain    Was seen by Dr Geralyn Knee in 2019 for pain. Had steroid injection. Continues to have pain. Would like to get an injection today.    Subjective:   Rodney Charles is a 68 y.o. very pleasant male patient with Body mass index is 34.46 kg/m. who presents with the following:  Patient presents with some ongoing shoulder pain.  I actually saw him about 6 years ago for some left-sided shoulder pain.  Right now, his shoulder is bothering him pretty much every day.  He does have a chronic pain syndrome and does take some Vicodin on as well as using topical such as lidocaine  routinely.  He has chronic spine pain which is the primary driver for his various pain medications.  He has pain in abduction, terminal internal range of motion as well as reaching behind the back.  He has not had any kind of specific injury that he can recall.  He does not have any numbness, tingling or radicular pain down the arm.  Review of Systems is noted in the HPI, as appropriate  Objective:   BP 138/88 (BP Location: Right Arm, Patient Position: Sitting, Cuff Size: Large)   Pulse 73   Temp 98.7 F (37.1 C) (Oral)   Ht 6' 1.25" (1.861 m)   Wt 263 lb (119.3 kg)   SpO2 95%   BMI 34.46 kg/m    Shoulder: L Inspection: No muscle wasting or winging Ecchymosis/edema: neg  AC joint, scapula, clavicle: NT Cervical spine: NT, full ROM Spurling's: neg Abduction: full, 5/5 Flexion: full, 5/5 IR, full, lift-off: 5/5 ER at neutral: full, 5/5 AC crossover: neg Neer: pos Hawkins: pos Drop Test: neg Jobe: pos Supraspinatus  insertion: mild-mod T Bicipital groove: NT Speed's: neg Yergason's: neg Sulcus sign: neg Scapular dyskinesis: none C5-T1 intact  Neuro: Sensation intact Grip 5/5   Laboratory and Imaging Data:  Assessment and Plan:     ICD-10-CM   1. Chronic left shoulder pain  M25.512 triamcinolone  acetonide (KENALOG -40) injection 40 mg   G89.29      He does have a lot of pain in abduction and internal range of motion.  His strength is fairly good with some weakness in abduction.  Think he continue his current pain regimen including Celebrex , TCA for chronic neck pain, hydrocodone , and additionally also takes gabapentin  regularly.  Think is reasonable do a therapeutic shoulder injection to try to help him with some pain.  Aspiration/Injection Procedure Note Rodney Charles 02-06-1955 Date of procedure: 05/23/2023  Procedure: Large Joint Aspiration / Injection of Shoulder, Intraarticular, L Indications: Pain  Procedure Details Verbal consent was obtained from the patient. Risks explained and contrasted with benefits and alternatives. Patient prepped with Chloraprep and Ethyl Chloride used for anesthesia. An intraarticular shoulder injection was performed using the posterior approach. The patient tolerated the procedure well and had decreased pain post injection. No complications. Injection: 4 cc of Lidocaine  1% and 1/2 mL Kenalog  40 mg. Needle: 21 gauge, 2 inch Medication: 1/2 cc of Kenalog  40 mg (equaling Kenalog  20 mg)  Aspiration/Injection Procedure Note Rodney Charles [birth year] Date of procedure: 05/23/2023  Procedure: Large Joint Aspiration / Injection of Shoulder, Subacromial, L Indications: Pain  Procedure Details Verbal consent was obtained from the patient. Risks, benefits, and alternatives were explained. Patient prepped with Chloraprep and Ethyl Chloride used for anesthesia. The subacromial space was injected using the posterior approach. The patient tolerated the procedure  well and had decreased pain post injection. No complications. Injection: 4 cc of Lidocaine  1% and 1/2 mL of Kenalog  40 mg. Needle: 22 gauge, 1 1/2 inch Medication: 1/2 cc of Kenalog  40 mg (equaling Kenalog  20 mg)   Medication Management during today's office visit: Meds ordered this encounter  Medications   triamcinolone  acetonide (KENALOG -40) injection 40 mg   Medications Discontinued During This Encounter  Medication Reason   albuterol  (VENTOLIN  HFA) 108 (90 Base) MCG/ACT inhaler Patient Preference    Orders placed today for conditions managed today: No orders of the defined types were placed in this encounter.   Disposition: No follow-ups on file.  [employer] speech-to-text software was used for transcription in this dictation.  Possible transcriptional errors can occur using Animal nutritionist.   Signed,  [name]. Rodney Faux, MD   Outpatient Encounter Medications as of 05/23/2023  Medication Sig   amLODipine  (NORVASC ) 5 MG tablet Take 1 tablet (5 mg total) by mouth daily.   celecoxib  (CELEBREX ) 200 MG capsule TAKE 1 CAPSULE(200 MG) BY MOUTH DAILY   clobetasol  cream (TEMOVATE ) 0.05 % Apply 1 Application topically 2 (two) times daily.   Colchicine  (MITIGARE ) 0.6 MG CAPS Take 1 capsule (0.6 mg total) by mouth daily as needed (gout flare).   fluticasone  (FLONASE ) 50 MCG/ACT nasal spray SHAKE LIQUID AND USE 2 SPRAYS IN EACH NOSTRIL DAILY   gabapentin  (NEURONTIN ) 300 MG capsule TAKE 1 CAPSULE BY MOUTH THREE TIMES DAILY   HYDROcodone -acetaminophen  (NORCO) 10-325 MG tablet Take 1 tablet by mouth every 8 (eight) hours as needed.   Menthol, Topical Analgesic, (BIOFREEZE EX) Apply topically as directed.   Multiple Vitamin (MULTIVITAMIN) tablet Take 1 tablet by mouth daily.   nortriptyline  (PAMELOR ) 25 MG capsule Take 1 capsule (25 mg total) by mouth at bedtime.   omeprazole  (PRILOSEC) 40 MG capsule Take 1 capsule (40 mg total) by mouth daily.   rosuvastatin  (CRESTOR ) 10 MG tablet  Take 1 tablet (10 mg total) by mouth daily. TAKE 1 TABLET(5 MG) BY MOUTH DAILY   sertraline  (ZOLOFT ) 25 MG tablet Take 1 tablet (25 mg total) by mouth daily.   [DISCONTINUED] albuterol  (VENTOLIN  HFA) 108 (90 Base) MCG/ACT inhaler INHALE 2 PUFFS INTO THE LUNGS EVERY 4 HOURS AS NEEDED FOR WHEEZING OR SHORTNESS OF BREATH   [EXPIRED] triamcinolone  acetonide (KENALOG -40) injection 40 mg    No facility-administered encounter medications on file as of 05/23/2023.

## 2023-05-23 ENCOUNTER — Encounter: Payer: Self-pay | Admitting: Family Medicine

## 2023-05-23 ENCOUNTER — Ambulatory Visit: Admitting: Family Medicine

## 2023-05-23 VITALS — BP 138/88 | HR 73 | Temp 98.7°F | Ht 73.25 in | Wt 263.0 lb

## 2023-05-23 DIAGNOSIS — G8929 Other chronic pain: Secondary | ICD-10-CM | POA: Diagnosis not present

## 2023-05-23 DIAGNOSIS — M25512 Pain in left shoulder: Secondary | ICD-10-CM | POA: Diagnosis not present

## 2023-05-23 MED ORDER — TRIAMCINOLONE ACETONIDE 40 MG/ML IJ SUSP
40.0000 mg | Freq: Once | INTRAMUSCULAR | Status: AC
  Administered 2023-05-23: 40 mg via INTRA_ARTICULAR

## 2023-05-24 ENCOUNTER — Encounter: Payer: Self-pay | Admitting: Family Medicine

## 2023-06-26 ENCOUNTER — Ambulatory Visit (HOSPITAL_BASED_OUTPATIENT_CLINIC_OR_DEPARTMENT_OTHER)
Admission: RE | Admit: 2023-06-26 | Discharge: 2023-06-26 | Disposition: A | Discharge status: Home / Self Care | Payer: Self-pay | Source: Ambulatory Visit | Attending: Family Medicine | Admitting: Family Medicine

## 2023-06-26 DIAGNOSIS — E785 Hyperlipidemia, unspecified: Secondary | ICD-10-CM | POA: Insufficient documentation

## 2023-06-27 ENCOUNTER — Ambulatory Visit: Payer: Self-pay | Admitting: Family Medicine

## 2023-06-27 ENCOUNTER — Other Ambulatory Visit: Payer: Self-pay | Admitting: Family Medicine

## 2023-06-27 DIAGNOSIS — E785 Hyperlipidemia, unspecified: Secondary | ICD-10-CM

## 2023-06-27 DIAGNOSIS — I7121 Aneurysm of the ascending aorta, without rupture: Secondary | ICD-10-CM

## 2023-06-27 DIAGNOSIS — R931 Abnormal findings on diagnostic imaging of heart and coronary circulation: Secondary | ICD-10-CM | POA: Insufficient documentation

## 2023-06-27 DIAGNOSIS — I2510 Atherosclerotic heart disease of native coronary artery without angina pectoris: Secondary | ICD-10-CM

## 2023-06-27 MED ORDER — ROSUVASTATIN CALCIUM 20 MG PO TABS: 20.0000 mg | ORAL_TABLET | Freq: Every day | ORAL | 3 refills | Status: DC

## 2023-06-27 MED ORDER — ASPIRIN 81 MG PO TBEC: 81.0000 mg | DELAYED_RELEASE_TABLET | Freq: Every day | ORAL | Status: AC

## 2023-07-15 ENCOUNTER — Other Ambulatory Visit: Payer: Self-pay | Admitting: Family Medicine

## 2023-07-15 DIAGNOSIS — M2550 Pain in unspecified joint: Secondary | ICD-10-CM

## 2023-07-15 NOTE — Telephone Encounter (Signed)
 Name of Medication:  Hydrocodone -APAP Name of Pharmacy:  [facility] Last [place] or Written Date and Quantity:  01/22/23, #40 Last Office Visit and Type:  05/14/23, 3 mo chronic pain mgmt f/u Next Office Visit and Type:  09/13/23, 4 mo chronic pain mgmt f/u Last Controlled Substance Agreement Date:  06/15/19 Last UDS:  06/15/19

## 2023-07-15 NOTE — Telephone Encounter (Unsigned)
 Copied from CRM [id number]. Topic: Clinical - Medication Refill >> Jul 15, 2023  9:28 AM Antwanette L wrote: Medication: HYDROcodone -acetaminophen  (NORCO) 10-325 MG tablet  Has the patient contacted their pharmacy? No   This is the patient's preferred pharmacy:  [facility] #[id number], [place] - [address] AT [facility] [address] [place] [address] Phone: [phone] Fax: [phone]  Is this the correct pharmacy for this prescription? Yes If no, delete pharmacy and type the correct one.   Has the prescription been filled recently? No. Last refilled on 05/14/23  Is the patient out of the medication? No. Patient has 2 pills left  Has the patient been seen for an appointment in the last year OR does the patient have an upcoming appointment? Yes. Last ov was on 05/23/23   Can we respond through MyChart? No. Contact patient by phone at [phone]  Agent: Please be advised that Rx refills may take up to 3 business days. We ask that you follow-up with your pharmacy.

## 2023-07-16 NOTE — Addendum Note (Signed)
 Addended by: [name] on: 07/16/2023 07:37 AM   Modules accepted: Orders

## 2023-07-17 MED ORDER — HYDROCODONE-ACETAMINOPHEN 10-325 MG PO TABS
1.0000 | ORAL_TABLET | Freq: Three times a day (TID) | ORAL | 0 refills | Status: DC | PRN
Start: 2023-07-17 — End: 2023-08-23

## 2023-07-17 NOTE — Telephone Encounter (Signed)
 ERx

## 2023-07-25 ENCOUNTER — Ambulatory Visit (HOSPITAL_COMMUNITY)
Admission: EM | Admit: 2023-07-25 | Discharge: 2023-07-25 | Disposition: A | Discharge status: Home / Self Care | Attending: Family Medicine | Admitting: Family Medicine

## 2023-07-25 ENCOUNTER — Encounter (HOSPITAL_COMMUNITY): Payer: Self-pay

## 2023-07-25 ENCOUNTER — Ambulatory Visit
Admission: RE | Admit: 2023-07-25 | Discharge: 2023-07-25 | Disposition: A | Discharge status: Home / Self Care | Source: Ambulatory Visit | Attending: Family Medicine | Admitting: Family Medicine

## 2023-07-25 DIAGNOSIS — M79601 Pain in right arm: Secondary | ICD-10-CM | POA: Diagnosis not present

## 2023-07-25 DIAGNOSIS — I7121 Aneurysm of the ascending aorta, without rupture: Secondary | ICD-10-CM | POA: Diagnosis not present

## 2023-07-25 DIAGNOSIS — I2510 Atherosclerotic heart disease of native coronary artery without angina pectoris: Secondary | ICD-10-CM | POA: Diagnosis not present

## 2023-07-25 DIAGNOSIS — M7989 Other specified soft tissue disorders: Secondary | ICD-10-CM

## 2023-07-25 DIAGNOSIS — T80818A Extravasation of other vesicant agent, initial encounter: Secondary | ICD-10-CM

## 2023-07-25 MED ORDER — IBUPROFEN 800 MG PO TABS
800.0000 mg | ORAL_TABLET | Freq: Once | ORAL | Status: AC
Start: 2023-07-25 — End: 2023-07-25
  Administered 2023-07-25: 800 mg via ORAL

## 2023-07-25 MED ORDER — IBUPROFEN 800 MG PO TABS
ORAL_TABLET | ORAL | Status: AC
Start: 2023-07-25 — End: 2023-07-25
  Filled 2023-07-25: qty 1

## 2023-07-25 MED ORDER — IOPAMIDOL (ISOVUE-370) INJECTION 76%
75.0000 mL | Freq: Once | INTRAVENOUS | Status: AC | PRN
  Administered 2023-07-25: 75 mL via INTRAVENOUS

## 2023-07-25 NOTE — ED Triage Notes (Signed)
 Patient presenting with right arm swelling and pain onset today. States was seen at [facility] and states the contrast dye was administered IV in the right arm.   Prescriptions or OTC medications tried: Yes- ice pack    with no relief

## 2023-07-25 NOTE — Discharge Instructions (Addendum)
 Massage the area where the contrast has gone into the tissue.   Elevate the affected arm as much as possible. At night, keep it elevated on two pillows (you should continue to do this until the swelling has gone down.)  Use an ice-pack or a bag of frozen vegetables wrapped in a clean tea towel over the site. Never place ice directly on skin as it may cause frostbite, and do not leave it on for more than 15 minutes at a time.  Reasons to go to the Emergency Department:  The pain becomes more severe and is not controlled by over the counter pain medications  There is increased swelling of the arm or hand   The arm or hand changes in color   There are pins and needles or altered sensation in the arm or hand   There is blistering or ulceration of the skin around the injection site
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
## 2023-08-01 ENCOUNTER — Ambulatory Visit: Payer: Self-pay | Admitting: Family Medicine

## 2023-08-01 DIAGNOSIS — I7121 Aneurysm of the ascending aorta, without rupture: Secondary | ICD-10-CM

## 2023-08-05 NOTE — Telephone Encounter (Signed)
 Per referral coordinator:  No NP openings in [place] until Oct  No NP openings in [place] until August No NP openings in [place] until July No NP openings in [place] until August   The patient can decide where he wants to go based on the timelines and then he can call and schedule appt.  Phone: [phone]
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
## 2023-08-16 ENCOUNTER — Ambulatory Visit: Attending: Cardiology | Admitting: Cardiology

## 2023-08-16 ENCOUNTER — Encounter: Payer: Self-pay | Admitting: Cardiology

## 2023-08-16 VITALS — BP 134/82 | HR 89 | Ht 74.0 in | Wt 262.2 lb

## 2023-08-16 DIAGNOSIS — I7121 Aneurysm of the ascending aorta, without rupture: Secondary | ICD-10-CM | POA: Diagnosis not present

## 2023-08-16 DIAGNOSIS — E7800 Pure hypercholesterolemia, unspecified: Secondary | ICD-10-CM | POA: Diagnosis not present

## 2023-08-16 DIAGNOSIS — I10 Essential (primary) hypertension: Secondary | ICD-10-CM | POA: Diagnosis not present

## 2023-08-16 DIAGNOSIS — I2510 Atherosclerotic heart disease of native coronary artery without angina pectoris: Secondary | ICD-10-CM

## 2023-08-16 MED ORDER — LOSARTAN POTASSIUM 25 MG PO TABS: 25.0000 mg | ORAL_TABLET | Freq: Every day | ORAL | 3 refills | Status: DC

## 2023-08-16 NOTE — Patient Instructions (Signed)
 Medication Instructions:  Start Losartan  25 mg daily Continue all other medications *If you need a refill on your cardiac medications before your next appointment, please call your pharmacy*  Lab Work: None ordered  Testing/Procedures: None ordered  Follow-Up: At [facility], you and your health needs are our priority.  As part of our continuing mission to provide you with exceptional heart care, our providers are all part of one team.  This team includes your primary Cardiologist (physician) and Advanced Practice Providers or APPs (Physician Assistants and Nurse Practitioners) who all work together to provide you with the care you need, when you need it.  Your next appointment:  1 year   Call in March to schedule July appointment    Provider:  Dr.Jordan        Schedule appointment with Pharm D in Lipid Clinic     We recommend signing up for the patient portal called MyChart.  Sign up information is provided on this After Visit Summary.  MyChart is used to connect with patients for Virtual Visits (Telemedicine).  Patients are able to view lab/test results, encounter notes, upcoming appointments, etc.  Non-urgent messages can be sent to your provider as well.   To learn more about what you can do with MyChart, go to [email or web address].

## 2023-08-23 ENCOUNTER — Other Ambulatory Visit: Payer: Self-pay | Admitting: Family Medicine

## 2023-08-23 DIAGNOSIS — M2550 Pain in unspecified joint: Secondary | ICD-10-CM

## 2023-08-23 NOTE — Telephone Encounter (Signed)
 Copied from CRM [id number]. Topic: Clinical - Medication Refill >> Aug 23, 2023 10:30 AM Berneda FALCON wrote: Medication: HYDROcodone -acetaminophen  (NORCO) 10-325 MG tablet  Has the patient contacted their pharmacy? No, they dont allow it for controlled substances (Agent: If no, request that the patient contact the pharmacy for the refill. If patient does not wish to contact the pharmacy document the reason why and proceed with request.) (Agent: If yes, when and what did the pharmacy advise?)  This is the patient's preferred pharmacy:  [facility] STORE #[id number] [facility] [place] - [address] AT [facility] [address] [place] [address] Phone: [phone] Fax: [phone]  Is this the correct pharmacy for this prescription? Yes If no, delete pharmacy and type the correct one.   Has the prescription been filled recently? No  Is the patient out of the medication? No  Has the patient been seen for an appointment in the last year OR does the patient have an upcoming appointment? Yes  Can we respond through MyChart? No  Agent: Please be advised that Rx refills may take up to 3 business days. We ask that you follow-up with your pharmacy.

## 2023-08-23 NOTE — Telephone Encounter (Signed)
 Name of Medication:  Hydrocodone -APAP Name of Pharmacy:  [facility] Last [place] U or Written Date and Quantity:  07/17/23, #40 Last Office Visit and Type:  05/14/23, 3 mo chronic pain mgmt f/u Next Office Visit and Type:  09/13/23, 4 mo chronic pain mgmt f/u Last Controlled Substance Agreement Date:  06/15/19 Last UDS:  06/15/19

## 2023-08-25 MED ORDER — HYDROCODONE-ACETAMINOPHEN 10-325 MG PO TABS: 1.0000 | ORAL_TABLET | Freq: Three times a day (TID) | ORAL | 0 refills | Status: DC | PRN

## 2023-08-25 NOTE — Telephone Encounter (Signed)
 ERx

## 2023-09-13 ENCOUNTER — Encounter: Payer: Self-pay | Admitting: Family Medicine

## 2023-09-13 ENCOUNTER — Ambulatory Visit (INDEPENDENT_AMBULATORY_CARE_PROVIDER_SITE_OTHER): Admitting: Family Medicine

## 2023-09-13 VITALS — BP 122/78 | HR 56 | Temp 98.5°F | Ht 74.0 in | Wt 266.1 lb

## 2023-09-13 DIAGNOSIS — M2550 Pain in unspecified joint: Secondary | ICD-10-CM

## 2023-09-13 DIAGNOSIS — E785 Hyperlipidemia, unspecified: Secondary | ICD-10-CM

## 2023-09-13 DIAGNOSIS — I7121 Aneurysm of the ascending aorta, without rupture: Secondary | ICD-10-CM

## 2023-09-13 DIAGNOSIS — R931 Abnormal findings on diagnostic imaging of heart and coronary circulation: Secondary | ICD-10-CM

## 2023-09-13 DIAGNOSIS — I2510 Atherosclerotic heart disease of native coronary artery without angina pectoris: Secondary | ICD-10-CM | POA: Diagnosis not present

## 2023-09-13 DIAGNOSIS — M545 Low back pain, unspecified: Secondary | ICD-10-CM | POA: Diagnosis not present

## 2023-09-13 DIAGNOSIS — G8929 Other chronic pain: Secondary | ICD-10-CM | POA: Diagnosis not present

## 2023-09-13 DIAGNOSIS — M25512 Pain in left shoulder: Secondary | ICD-10-CM | POA: Diagnosis not present

## 2023-09-13 MED ORDER — ROSUVASTATIN CALCIUM 10 MG PO TABS: 10.0000 mg | ORAL_TABLET | Freq: Every day | ORAL | 3 refills | Status: DC

## 2023-09-13 NOTE — Patient Instructions (Addendum)
 Restart rosuvastatin  (Crestor ) 10mg  daily. Keep pharmacy clinic appointment.  I will call your pharmacy to verify shingrix shots.  Good to see you today Return in 3 months for chronic pain follow up visit Continue current medicines Consider PT

## 2023-09-13 NOTE — Assessment & Plan Note (Signed)
 Rosuvastatin  20mg  may have caused itchy rash to leg. Agrees to restart rosuvastatin  10mg .  Has been referred to pharmacy for PCSK9i commencement.

## 2023-09-13 NOTE — Assessment & Plan Note (Addendum)
[place] CSRS reviewed. Stable period on current regimen - continue.

## 2023-09-13 NOTE — Assessment & Plan Note (Signed)
 Appreciate cards care. Rec medical management.  Losartan  started, referred to pharmacy for PCSK9i commencement.

## 2023-09-13 NOTE — Progress Notes (Signed)
 Ph: [phone] Fax: [phone]   Patient ID: Rodney Charles, male    DOB: 1955-10-11, 68 y.o.   MRN: [id number]  This visit was conducted in person.  BP 122/78   Pulse (!) 56   Temp 98.5 F (36.9 C) (Oral)   Ht 6' 2 (1.88 m)   Wt 266 lb 2 oz (120.7 kg)   SpO2 98%   BMI 34.17 kg/m    CC: 4 mo f/u visit  Subjective:   HPI: Rodney Charles is a 68 y.o. male presenting on 09/13/2023 for Medical Management of Chronic Issues (3-4 mth f/u. /C/o Back and L shoulder pain.)   Elevated agatston calcium  score to 454 with dilated prox thoracic aorta to 43mm. Established with cardiology with plan to repeat CTA 1 yr. 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 Losartan  25mg  started. Rec goal LDL <70 ideally <55. Has been referred to PharmD clinic to discuss PCSK9i.   Was on Crestor  20mg  daily - but stopped due to new itchy red rash to RLE which he attributed to higher crestor  dose - agrees to restart 10mg  Crestor .   Chronic pain (cervical spine, lumbar spine with R sciatica as well as L shoulder pain) on celebrex  200mg  daily, gabapentin  300mg  TID, and nortriptyline  25mg  nightly. Also on hydrocodone  10/325mg  TID PRN #40/month last filled 08/26/2023 and still has #25 tablets left - tends to take opiate more regularly in winter months given increased pain in the cold weather. He's not interested in longer acting opiate. Previous PT wasn't very helpful. Does not want further surgery. Has had several back surgeries. Previously failed ESI. Has TENS unit.   Tolerating pain regimen well without constipation drowsiness unsteadiness or sedation.   Ongoing pains - worse with weather - lower back and L shoulder.  Received L shoulder steroid injection by Dr Watt on 05/23/2023 with temporary relief.   [facility] on [address] - ?shingrix shots.  [facility]ed [facility] - he received only 1 Shingrix through them on 09/11/2019. He has another zoster vaccine listed on [facility] 07/15/2019 but unsure where or what type it was.  Will offer updated shingrix through our office via [facility].      Relevant past medical, surgical, family and social history reviewed and updated as indicated. Interim medical history since our last visit reviewed. Allergies and medications reviewed and updated. Outpatient Medications Prior to Visit  Medication Sig Dispense Refill   alfuzosin (UROXATRAL) 10 MG 24 hr tablet Take 10 mg by mouth daily.     amLODipine  (NORVASC ) 5 MG tablet Take 1 tablet (5 mg total) by mouth daily. 90 tablet 4   aspirin  EC 81 MG tablet Take 1 tablet (81 mg total) by mouth daily. Swallow whole.     celecoxib  (CELEBREX ) 200 MG capsule TAKE 1 CAPSULE(200 MG) BY MOUTH DAILY 90 capsule 4   clobetasol  cream (TEMOVATE ) 0.05 % Apply 1 Application topically 2 (two) times daily. 30 g 0   Colchicine  (MITIGARE ) 0.6 MG CAPS Take 1 capsule (0.6 mg total) by mouth daily as needed (gout flare). 30 capsule 3   fluticasone  (FLONASE ) 50 MCG/ACT nasal spray SHAKE LIQUID AND USE 2 SPRAYS IN EACH NOSTRIL DAILY 16 g 6   gabapentin  (NEURONTIN ) 300 MG capsule TAKE 1 CAPSULE BY MOUTH THREE TIMES DAILY 270 capsule 3   HYDROcodone -acetaminophen  (NORCO) 10-325 MG tablet Take 1 tablet by mouth every 8 (eight) hours as needed. 40 tablet 0   losartan  (COZAAR ) 25 MG tablet Take 1 tablet (25 mg total) by mouth  daily. 90 tablet 3   Menthol, Topical Analgesic, (BIOFREEZE EX) Apply topically as directed.     Multiple Vitamin (MULTIVITAMIN) tablet Take 1 tablet by mouth daily.     nortriptyline  (PAMELOR ) 25 MG capsule Take 1 capsule (25 mg total) by mouth at bedtime. 90 capsule 4   omeprazole  (PRILOSEC) 40 MG capsule Take 1 capsule (40 mg total) by mouth daily. 90 capsule 4   sertraline  (ZOLOFT ) 25 MG tablet Take 1 tablet (25 mg total) by mouth daily. 90 tablet 4   rosuvastatin  (CRESTOR ) 20 MG tablet Take 1 tablet (20 mg total) by mouth daily. 90 tablet 3   No facility-administered medications prior to visit.     Per HPI unless specifically  indicated in ROS section below Review of Systems  Objective:  BP 122/78   Pulse (!) 56   Temp 98.5 F (36.9 C) (Oral)   Ht 6' 2 (1.88 m)   Wt 266 lb 2 oz (120.7 kg)   SpO2 98%   BMI 34.17 kg/m   Wt Readings from Last 3 Encounters:  09/13/23 266 lb 2 oz (120.7 kg)  08/16/23 262 lb 3.2 oz (118.9 kg)  07/25/23 259 lb (117.5 kg)      Physical Exam Vitals and nursing note reviewed.  Constitutional:      Appearance: Normal appearance. He is not ill-appearing.  HENT:     Mouth/Throat:     Mouth: Mucous membranes are moist.     Pharynx: Oropharynx is clear. No oropharyngeal exudate or posterior oropharyngeal erythema.  Eyes:     Extraocular Movements: Extraocular movements intact.     Conjunctiva/sclera: Conjunctivae normal.     Pupils: Pupils are equal, round, and reactive to light.  Cardiovascular:     Rate and Rhythm: Normal rate and regular rhythm.     Pulses: Normal pulses.     Heart sounds: Normal heart sounds. No murmur heard. Pulmonary:     Effort: Pulmonary effort is normal. No respiratory distress.     Breath sounds: Normal breath sounds. No wheezing, rhonchi or rales.  Musculoskeletal:     Right lower leg: No edema.     Left lower leg: No edema.  Skin:    General: Skin is warm and dry.     Findings: No rash.  Neurological:     Mental Status: He is alert.  Psychiatric:        Mood and Affect: Mood normal.        Behavior: Behavior normal.       Lab Results  Component Value Date   CHOL 209 (H) 02/07/2023   HDL 69.00 02/07/2023   LDLCALC 123 (H) 02/07/2023   LDLDIRECT 144.9 09/02/2012   TRIG 85.0 02/07/2023   CHOLHDL 3 02/07/2023     Assessment & Plan:   Problem List Items Addressed This Visit     Encounter for chronic pain management - Primary (Chronic)   [place] CSRS reviewed.  Stable period on current regimen - continue.       Chronic lower back pain   HLD (hyperlipidemia)   Rosuvastatin  20mg  may have caused itchy rash to leg. Agrees to restart  rosuvastatin  10mg .  Has been referred to pharmacy for PCSK9i commencement.        Relevant Medications   rosuvastatin  (CRESTOR ) 10 MG tablet   CAD (coronary artery disease)   Relevant Medications   rosuvastatin  (CRESTOR ) 10 MG tablet   Polyarthralgia   Chronic left shoulder pain   Saw sports med s/p steroid  injection with temporary benefit Offered PT eval - declines. Not interested in surgery.       Aneurysm of ascending aorta without rupture (HCC)   Rec goal BP <130/80      Relevant Medications   rosuvastatin  (CRESTOR ) 10 MG tablet   Agatston CAC score, >400   Appreciate cards care. Rec medical management.  Losartan  started, referred to pharmacy for PCSK9i commencement.       Relevant Medications   rosuvastatin  (CRESTOR ) 10 MG tablet     Meds ordered this encounter  Medications   rosuvastatin  (CRESTOR ) 10 MG tablet    Sig: Take 1 tablet (10 mg total) by mouth daily.    Dispense:  90 tablet    Refill:  3    No orders of the defined types were placed in this encounter.   Patient Instructions  Restart rosuvastatin  (Crestor ) 10mg  daily. Keep pharmacy clinic appointment.  I will call your pharmacy to verify shingrix shots.  Good to see you today Return in 3 months for chronic pain follow up visit Continue current medicines Consider PT  Follow up plan: Return if symptoms worsen or fail to improve.  Anton Blas, MD

## 2023-09-13 NOTE — Assessment & Plan Note (Signed)
 Rec goal BP <130/80

## 2023-09-13 NOTE — Assessment & Plan Note (Signed)
 Saw sports med s/p steroid injection with temporary benefit Offered PT eval - declines. Not interested in surgery.

## 2023-09-26 ENCOUNTER — Telehealth: Payer: Self-pay | Admitting: Pharmacy Technician

## 2023-09-26 ENCOUNTER — Ambulatory Visit: Admitting: Pharmacist

## 2023-09-26 ENCOUNTER — Other Ambulatory Visit (HOSPITAL_COMMUNITY): Payer: Self-pay

## 2023-09-26 ENCOUNTER — Ambulatory Visit: Attending: Cardiology | Admitting: Pharmacist

## 2023-09-26 DIAGNOSIS — E785 Hyperlipidemia, unspecified: Secondary | ICD-10-CM

## 2023-09-26 NOTE — Telephone Encounter (Signed)
 Per cc charts  Pharmacy Patient Advocate Encounter   Received notification from cc charts that prior authorization for repatha  is required/requested.   Insurance verification completed.   The patient is insured through [facility] .   Per test claim: PA required; PA submitted to above mentioned insurance via Latent Key/confirmation #/EOC [id number] Status is pending
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
## 2023-09-26 NOTE — Patient Instructions (Signed)
 I will submit a prior authorization for Repatha. I will call you once I hear back. Please call me at [phone] with any questions.   Repatha is a cholesterol medication that improved your body's ability to get rid of bad cholesterol known as LDL. It can lower your LDL up to 60%! It is an injection that is given under the skin every 2 weeks. The medication often requires a prior authorization from your insurance company. We will take care of submitting all the necessary information to your insurance company to get it approved. The most common side effects of Repatha include runny nose, symptoms of the common cold, rarely flu or flu-like symptoms, back/muscle pain in about 3-4% of the patients, and redness, pain, or bruising at the injection site. Tell your healthcare provider if you have any side effect that bothers you or that does not go away.   Hyperlipidemia Foods high in saturated fat tend to increase LDL (bad) cholesterol the most.  Not all fat is bad fat! Foods higher in unsaturated fat are healthy, like fish, nuts, and avocadoes. Overall, following a diet like the Mediterranean diet can help to improve your cholesterol. Hypertriglyceridemia Foods high in carbohydrates and sugar, as well as alcohol, can increase your triglycerides. If you are diabetic, poorly controlled blood sugar can also increase your triglycerides. A non-fasting state can affect the triglyceride level in your lab work. Please make sure you are fasting to improve accuracy of this lab test.  Eat more of these Eat less of these  Carbohydrates Fiber-rich whole grains: oats, whole wheat pasta or bread, quinoa, barley, oats and brown rice Aim for  of your plate to be whole grains Men: aim for > 38 grams of fiber per day Women: aim for > 25 grams of fiber per day Refined grains: white bread, rice, or pasta, macaroni and cheese Foods with added sugar Processed foods: desserts like cake, cookies, donuts, muffins, and  pastries; microwave meals, chips, [place] fries  Fruits and vegetables A variety of bright colored fruits and vegetables: spinach, broccoli, tomatoes, carrots, berries,  oranges, apples, bananas, berries, and melon Aim for  of your plate to be fruits/vegetables Canned vegetables Starchy vegetables like potatoes Canned fruit in heavy syrup  Protein Lean meat: skinless chicken or [place] Fish: salmon, trout, tuna, cod, tilapia, flounder, etc Legumes: beans, lentils, chickpeas, tofu, nuts Aim for  of your plate to be protein Red, fatty, or fried meat Processed foods: deli meat, hot dogs, burgers, pizza, fast food   Dairy, fats and oils Unsaturated fats: fish, nuts, and avocadoes  Low fat or fat free milk or yogurt Olive and canola oil Saturated fats: butter, lard, cream, coconut oil Whole milk and other full fat dairy products like cheese Sugar-sweetened dairy products (many yogurts have added sugar)  Drinks Water: plain or sparkling Sugar free or diet drinks Unsweet tea or coffee Keep added sugar intake to  < 6 teaspoons (24 grams) Regular soda Fruit juice Alcohol  Other ways to adopt a healthy lifestyle:  Exercise:  Exercise: Aim for 150 min of moderate intensity exercise weekly for heart health, and weights twice weekly for bone health. Stay active - any steps are better than no steps!  Sleep: Aim for 7-9 hours of sleep nightly.  Weight: Know what a healthy weight is for you (roughly BMI <25) and aim to maintain this. Unfortunately, this is not the most accurate measure of healthy weight, but it is the simplest measurement to use. A more accurate  measurement involves body scanning which measures lean muscle, fat tissue and bony density. We do not have this equipment at [facility].

## 2023-09-26 NOTE — Assessment & Plan Note (Signed)
 Assessment: LDL-C of 123 is above goal of less than 70 Patient on rosuvastatin  10 mg daily Discussed Repatha , including dosing and side effects and injection technique. Patient agreeable to trying Repatha  We reviewed diet -encouraged him to decrease his red food and fried food intake  Plan: Submit prior authorization for Repatha  Continue rosuvastatin  10 mg daily Repeat labs in 3 months

## 2023-09-27 ENCOUNTER — Telehealth: Payer: Self-pay | Admitting: Pharmacy Technician

## 2023-09-27 ENCOUNTER — Other Ambulatory Visit (HOSPITAL_COMMUNITY): Payer: Self-pay

## 2023-09-27 DIAGNOSIS — M2550 Pain in unspecified joint: Secondary | ICD-10-CM

## 2023-09-27 MED ORDER — REPATHA SURECLICK 140 MG/ML ~~LOC~~ SOAJ
1.0000 mL | SUBCUTANEOUS | 3 refills | Status: AC
  Filled 2023-09-27: qty 6, 84d supply, fill #0
  Filled 2023-12-16: qty 6, 84d supply, fill #1

## 2023-09-27 NOTE — Telephone Encounter (Signed)
 Patient Advocate Encounter   The patient was approved for a [employer] grant that will help cover the cost of repatha  Total amount awarded, 2500.00.  Effective: 08/28/23 - 08/26/24   APW:[id number] ERW:[id number] Hmnle:[id number] PI:[id number]  [employer] ID: [id number]   Pharmacy provided with approval and processing information. Added to [facility]

## 2023-09-27 NOTE — Telephone Encounter (Signed)
 Pharmacy Patient Advocate Encounter  Received notification from [employer] that Prior Authorization for repatha  has been APPROVED from 09/27/23 to 03/28/24. Ran test claim, Copay is $12.15. This test claim was processed through [facility]- copay amounts may vary at other pharmacies due to pharmacy/plan contracts, or as the patient moves through the different stages of their insurance plan.   PA #/Case ID/Reference #: [id number]

## 2023-09-27 NOTE — Telephone Encounter (Signed)
 LVM for pt to call back.

## 2023-09-27 NOTE — Addendum Note (Signed)
 Addended by: [place] Katich D on: 09/27/2023 04:33 PM   Modules accepted: Orders

## 2023-09-27 NOTE — Telephone Encounter (Signed)
 Spoke with patient.  Made him aware that Repatha  PA was approved.  Co-pay $12.15.  Asked if this was okay patient stated he would appreciate if they had some help with the co-pay cost.  Will get patient health will grant and he is okay using [facility].

## 2023-09-27 NOTE — Telephone Encounter (Signed)
 Patient made aware that grant was approved and rx sent to [facility] pharmacy

## 2023-09-30 ENCOUNTER — Other Ambulatory Visit (HOSPITAL_COMMUNITY): Payer: Self-pay

## 2023-10-14 ENCOUNTER — Other Ambulatory Visit (HOSPITAL_COMMUNITY): Payer: Self-pay

## 2023-10-14 MED ORDER — HYDROCODONE-ACETAMINOPHEN 10-325 MG PO TABS
1.0000 | ORAL_TABLET | Freq: Three times a day (TID) | ORAL | 0 refills | Status: DC | PRN
  Filled 2023-10-14: qty 40, 14d supply, fill #0

## 2023-10-14 MED ORDER — HYDROCODONE-ACETAMINOPHEN 10-325 MG PO TABS: 1.0000 | ORAL_TABLET | Freq: Three times a day (TID) | ORAL | 0 refills | Status: DC | PRN

## 2023-10-14 NOTE — Addendum Note (Signed)
 Addended by: RILLA BALLER on: 10/14/2023 06:09 PM   Modules accepted: Orders

## 2023-10-14 NOTE — Telephone Encounter (Addendum)
 ERx Why was this placed under old cardiology telephone encounter? It was not keyed up as a controlled substance refill ie last refill date etc.  I sent to wrong pharmacy - [facility]. I resent to right pharmacy - [facility]. Plz notify pt.

## 2023-10-14 NOTE — Telephone Encounter (Unsigned)
 Copied from CRM [id number]. Topic: Clinical - Medication Refill >> Oct 14, 2023 10:37 AM Kathrin PARAS wrote: Medication: HYDROcodone -acetaminophen  (NORCO) 10-325 MG tablet  Has the patient contacted their pharmacy? No (Agent: If no, request that the patient contact the pharmacy for the refill. If patient does not wish to contact the pharmacy document the reason why and proceed with request.) (Agent: If yes, when and what did the pharmacy advise?)  This is the patient's preferred pharmacy:  [facility] STORE #[id number], [place] - [address] AT [facility] [address] [place] [address] Phone: [phone] Fax: [phone]  Is this the correct pharmacy for this prescription? Yes If no, delete pharmacy and type the correct one.   Has the prescription been filled recently? Yes  Is the patient out of the medication? Yes  Has the patient been seen for an appointment in the last year OR does the patient have an upcoming appointment? Yes  Can we respond through MyChart? No  Agent: Please be advised that Rx refills may take up to 3 business days. We ask that you follow-up with your pharmacy.

## 2023-10-15 ENCOUNTER — Other Ambulatory Visit (HOSPITAL_COMMUNITY): Payer: Self-pay

## 2023-10-15 NOTE — Telephone Encounter (Signed)
 This was added to wrong message by person who started it. I have called an verified one to [facility] was cancelled.  No further action needed at this time.

## 2023-11-14 ENCOUNTER — Telehealth: Payer: Self-pay

## 2023-11-14 NOTE — Telephone Encounter (Signed)
 Not able to leave message voicemail not set. Need to call patient to schedule patient for shingles #2 when pharmacy is in office. I have verified that they can do second one with patient.

## 2023-11-19 NOTE — Telephone Encounter (Signed)
 Spoke with pt scheduling pt for [facility] Immunization clinic on 12/24/23 for 2nd shingles, Tdap and Covid booster.

## 2023-12-06 ENCOUNTER — Telehealth: Payer: Self-pay | Admitting: Pharmacist

## 2023-12-06 NOTE — Telephone Encounter (Signed)
 Patient called to verify that he needs to come for labs after his last reaptha (6th) shot. Verified this is correct and orders are in.

## 2023-12-16 ENCOUNTER — Other Ambulatory Visit (HOSPITAL_COMMUNITY): Payer: Self-pay

## 2023-12-17 LAB — LIPID PANEL
Chol/HDL Ratio: 1.8 ratio (ref 0.0–5.0)
Cholesterol, Total: 130 mg/dL (ref 100–199)
HDL: 74 mg/dL (ref >39)
LDL Chol Calc (NIH): 43 mg/dL (ref 0–99)
Triglycerides: 58 mg/dL (ref 0–149)
VLDL Cholesterol Cal: 13 mg/dL (ref 5–40)

## 2023-12-20 ENCOUNTER — Ambulatory Visit (INDEPENDENT_AMBULATORY_CARE_PROVIDER_SITE_OTHER): Admitting: Family Medicine

## 2023-12-20 ENCOUNTER — Encounter: Payer: Self-pay | Admitting: Family Medicine

## 2023-12-20 VITALS — BP 116/78 | HR 76 | Temp 98.7°F | Ht 74.0 in | Wt 261.2 lb

## 2023-12-20 DIAGNOSIS — M2550 Pain in unspecified joint: Secondary | ICD-10-CM | POA: Diagnosis not present

## 2023-12-20 DIAGNOSIS — E785 Hyperlipidemia, unspecified: Secondary | ICD-10-CM

## 2023-12-20 DIAGNOSIS — Z23 Encounter for immunization: Secondary | ICD-10-CM

## 2023-12-20 DIAGNOSIS — G8929 Other chronic pain: Secondary | ICD-10-CM

## 2023-12-20 DIAGNOSIS — R931 Abnormal findings on diagnostic imaging of heart and coronary circulation: Secondary | ICD-10-CM

## 2023-12-20 DIAGNOSIS — M545 Low back pain, unspecified: Secondary | ICD-10-CM | POA: Diagnosis not present

## 2023-12-20 MED ORDER — HYDROCODONE-ACETAMINOPHEN 10-325 MG PO TABS
1.0000 | ORAL_TABLET | Freq: Three times a day (TID) | ORAL | 0 refills | Status: AC | PRN
Start: 2023-12-20 — End: ?

## 2023-12-20 NOTE — Assessment & Plan Note (Addendum)
 Ongoing OA related pain.

## 2023-12-20 NOTE — Assessment & Plan Note (Addendum)
 [place] CSRS reviewed Stable period on current regimen - continue.  Update UDS today - last done 2022.

## 2023-12-20 NOTE — Assessment & Plan Note (Addendum)
 Chronic, worse in winter months.  Continue current regimen including celebrex  200mg  daily, nortriptyline  25mg  nightly, gabapentin  300mg  TID, and hydrocodone  10/325mg  #40/mo.  He declines TCA titration due to possible side effects.

## 2023-12-20 NOTE — Patient Instructions (Addendum)
 Flu shot today  Urine drug screen today.  Good to see you today Return in 2 months for physical, prior fasting for labwork.

## 2023-12-20 NOTE — Addendum Note (Signed)
 Addended by: HOPE VEVA PARAS on: 12/20/2023 02:25 PM   Modules accepted: Orders

## 2023-12-20 NOTE — Assessment & Plan Note (Signed)
 Now on repatha  q2 wks. Also continues rosuvastatin  10mg  daily. Appreciate cardiolgy and Pharm[facility] lipid clinic care.

## 2023-12-20 NOTE — Progress Notes (Signed)
 Ph: [phone] Fax: [phone]   Patient ID: Rodney Charles, male    DOB: 1955/10/08, 68 y.o.   MRN: [id number]  This visit was conducted in person.  BP 116/78   Pulse 76   Temp 98.7 F (37.1 C) (Oral)   Ht 6' 2 (1.88 m)   Wt 261 lb 4 oz (118.5 kg)   SpO2 98%   BMI 33.54 kg/m   BP Readings from Last 3 Encounters:  12/20/23 116/78  09/13/23 122/78  08/16/23 134/82   CC: 3 mo chronic pain f/u visit  Subjective:   HPI: Rodney Charles is a 68 y.o. male presenting on 12/20/2023 for Medical Management of Chronic Issues (Pt here for 3 mth f/u for chronic pain. /Pt still having pain. Pain Scale today is 9)   Chronic pain (cervical spine, lumbar spine with R sciatica as well as L shoulder pain) on celebrex  200mg  daily, gabapentin  300mg  TID, and nortriptyline  25mg  nightly. Also on hydrocodone  10/325mg  TID PRN #40/month last filled 10/14/2023 - tends to take opiate more regularly in winter months given increased pain in the cold weather. He's not interested in longer acting opiate. Previous PT wasn't very helpful. Has had several back surgeries. Previously failed ESI. Has TENS unit. Does not want further surgery. Tolerating pain regimen well without constipation drowsiness unsteadiness or sedation.   Ongoing pains - worse with weather - lower back and L shoulder.  Received L shoulder steroid injection by Dr Watt on 05/23/2023 with temporary relief.   Brings picture from last month where R wrist swelled up but it wasn't painful, no associated rash but the skin was itchy. He held rosuvastatin  for a few days and it resolved.   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 Established with cardiology with plan to repeat CTA 1 yr. 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 Losartan  25mg  started. Rec goal LDL <70 ideally <55. Saw Pharm[facility] cardiology clinic - approved for Repatha  [id number]. Higher crestor  20mg  dose may have caused itchy rash - doing better on 10mg  daily.       Relevant past medical, surgical, family and social history reviewed and updated as indicated. Interim medical history since our last visit reviewed. Allergies and medications reviewed and updated. Outpatient Medications Prior to Visit  Medication Sig Dispense Refill   alfuzosin (UROXATRAL) 10 MG 24 hr tablet Take 10 mg by mouth daily.     amLODipine  (NORVASC ) 5 MG tablet Take 1 tablet (5 mg total) by mouth daily. 90 tablet 4   aspirin  EC 81 MG tablet Take 1 tablet (81 mg total) by mouth daily. Swallow whole.     celecoxib  (CELEBREX ) 200 MG capsule TAKE 1 CAPSULE(200 MG) BY MOUTH DAILY 90 capsule 4   clobetasol  cream (TEMOVATE ) 0.05 % Apply 1 Application topically 2 (two) times daily. 30 g 0   Colchicine  (MITIGARE ) 0.6 MG CAPS Take 1 capsule (0.6 mg total) by mouth daily as needed (gout flare). 30 capsule 3   Evolocumab  (REPATHA  SURECLICK) 140 MG/ML SOAJ Inject 140 mg into the skin every 14 (fourteen) days. 6 mL 3   fluticasone  (FLONASE ) 50 MCG/ACT nasal spray SHAKE LIQUID AND USE 2 SPRAYS IN EACH NOSTRIL DAILY 16 g 6   gabapentin  (NEURONTIN ) 300 MG capsule TAKE 1 CAPSULE BY MOUTH THREE TIMES DAILY 270 capsule 3   losartan  (COZAAR ) 25 MG tablet Take 1 tablet (25 mg total) by mouth daily. 90 tablet 3   Menthol, Topical Analgesic, (BIOFREEZE EX) Apply topically as  directed.     Multiple Vitamin (MULTIVITAMIN) tablet Take 1 tablet by mouth daily.     nortriptyline  (PAMELOR ) 25 MG capsule Take 1 capsule (25 mg total) by mouth at bedtime. 90 capsule 4   omeprazole  (PRILOSEC) 40 MG capsule Take 1 capsule (40 mg total) by mouth daily. 90 capsule 4   rosuvastatin  (CRESTOR ) 10 MG tablet Take 1 tablet (10 mg total) by mouth daily. 90 tablet 3   sertraline  (ZOLOFT ) 25 MG tablet Take 1 tablet (25 mg total) by mouth daily. 90 tablet 4   HYDROcodone -acetaminophen  (NORCO) 10-325 MG tablet Take 1 tablet by mouth every 8 (eight) hours as needed. 40 tablet 0   No facility-administered medications prior to  visit.     Per HPI unless specifically indicated in ROS section below Review of Systems  Objective:  BP 116/78   Pulse 76   Temp 98.7 F (37.1 C) (Oral)   Ht 6' 2 (1.88 m)   Wt 261 lb 4 oz (118.5 kg)   SpO2 98%   BMI 33.54 kg/m   Wt Readings from Last 3 Encounters:  12/20/23 261 lb 4 oz (118.5 kg)  09/13/23 266 lb 2 oz (120.7 kg)  08/16/23 262 lb 3.2 oz (118.9 kg)      Physical Exam Vitals and nursing note reviewed.  Constitutional:      Appearance: Normal appearance. He is not ill-appearing.  HENT:     Head: Normocephalic and atraumatic.     Mouth/Throat:     Mouth: Mucous membranes are moist.     Pharynx: Oropharynx is clear. No oropharyngeal exudate or posterior oropharyngeal erythema.  Eyes:     Extraocular Movements: Extraocular movements intact.     Conjunctiva/sclera: Conjunctivae normal.     Pupils: Pupils are equal, round, and reactive to light.  Cardiovascular:     Rate and Rhythm: Normal rate and regular rhythm.     Pulses: Normal pulses.     Heart sounds: Normal heart sounds. No murmur heard. Pulmonary:     Effort: Pulmonary effort is normal. No respiratory distress.     Breath sounds: Normal breath sounds. No wheezing, rhonchi or rales.  Musculoskeletal:     Right lower leg: No edema.     Left lower leg: No edema.  Skin:    General: Skin is warm and dry.     Findings: No rash.  Neurological:     Mental Status: He is alert.  Psychiatric:        Mood and Affect: Mood normal.        Behavior: Behavior normal.       Results for orders placed or performed in visit on 09/26/23  Lipid Profile   Collection Time: 12/16/23 11:08 AM  Result Value Ref Range   Cholesterol, Total 130 100 - 199 mg/dL   Triglycerides 58 0 - 149 mg/dL   HDL 74 >60 mg/dL   VLDL Cholesterol Cal 13 5 - 40 mg/dL   LDL Chol Calc (NIH) 43 0 - 99 mg/dL   Chol/HDL Ratio 1.8 0.0 - 5.0 ratio    Assessment & Plan:   Problem List Items Addressed This Visit     Encounter for  chronic pain management - Primary (Chronic)   [place] CSRS reviewed Stable period on current regimen - continue.  Update UDS today - last done 2022.       Chronic lower back pain   Chronic, worse in winter months.  Continue current regimen including celebrex  200mg  daily, nortriptyline   25mg  nightly, gabapentin  300mg  TID, and hydrocodone  10/325mg  #40/mo.  He declines TCA titration due to possible side effects.       Relevant Medications   HYDROcodone -acetaminophen  (NORCO) 10-325 MG tablet   Other Relevant Orders   Drug Screen, Urine   HLD (hyperlipidemia)   Now on repatha  q2 wks. Also continues rosuvastatin  10mg  daily. Appreciate cardiolgy and PharmD lipid clinic care.       Polyarthralgia   Ongoing OA related pain.       Relevant Medications   HYDROcodone -acetaminophen  (NORCO) 10-325 MG tablet   Agatston CAC score, >400   Other Visit Diagnoses       Encounter for immunization       Relevant Orders   Flu vaccine HIGH DOSE PF(Fluzone Trivalent) (Completed)        Meds ordered this encounter  Medications   HYDROcodone -acetaminophen  (NORCO) 10-325 MG tablet    Sig: Take 1 tablet by mouth every 8 (eight) hours as needed.    Dispense:  40 tablet    Refill:  0    Chronic pain medication    Orders Placed This Encounter  Procedures   Flu vaccine HIGH DOSE PF(Fluzone Trivalent)   Drug Screen, Urine    Patient Instructions  Flu shot today  Urine drug screen today.  Good to see you today Return in 2 months for physical, prior fasting for labwork.   Follow up plan: Return in about 2 months (around 02/19/2024) for annual exam, prior fasting for blood work.  Anton Blas, MD

## 2023-12-23 ENCOUNTER — Other Ambulatory Visit: Payer: Self-pay

## 2023-12-23 LAB — DRUG MONITORING, PANEL 8 WITH CONFIRMATION, URINE
6 Acetylmorphine: NEGATIVE ng/mL (ref <10)
Alcohol Metabolites: NEGATIVE ng/mL (ref <500)
Amphetamines: NEGATIVE ng/mL (ref <500)
Benzodiazepines: NEGATIVE ng/mL (ref <100)
Buprenorphine, Urine: NEGATIVE ng/mL (ref <5)
Cocaine Metabolite: NEGATIVE ng/mL (ref <150)
Codeine: NEGATIVE ng/mL (ref <50)
Creatinine: >300 mg/dL (ref >=20.0)
Hydrocodone: 3051 ng/mL — ABNORMAL HIGH (ref <50)
Hydromorphone: 425 ng/mL — ABNORMAL HIGH (ref <50)
MDMA: NEGATIVE ng/mL (ref <500)
Marijuana Metabolite: 14 ng/mL — ABNORMAL HIGH (ref <5)
Marijuana Metabolite: POSITIVE ng/mL — AB (ref <20)
Morphine: NEGATIVE ng/mL (ref <50)
Norhydrocodone: 3209 ng/mL — ABNORMAL HIGH (ref <50)
Opiates: POSITIVE ng/mL — AB (ref <100)
Oxidant: NEGATIVE ug/mL (ref <200)
Oxycodone: NEGATIVE ng/mL (ref <100)
pH: 5.8 (ref 4.5–9.0)

## 2023-12-23 LAB — DM TEMPLATE

## 2023-12-23 MED ORDER — COMIRNATY 30 MCG/0.3ML IM SUSY
0.3000 mL | PREFILLED_SYRINGE | Freq: Once | INTRAMUSCULAR | 0 refills | Status: AC
  Filled 2023-12-24: qty 0.3, 1d supply, fill #0

## 2023-12-23 MED ORDER — SHINGRIX 50 MCG/0.5ML IM SUSR
INTRAMUSCULAR | 1 refills | Status: DC
  Filled 2023-12-24: qty 1, 1d supply, fill #0

## 2023-12-23 MED ORDER — BOOSTRIX 5-2.5-18.5 LF-MCG/0.5 IM SUSY
PREFILLED_SYRINGE | INTRAMUSCULAR | 0 refills | Status: DC
  Filled 2023-12-24: qty 0.5, 1d supply, fill #0

## 2023-12-24 ENCOUNTER — Ambulatory Visit: Payer: Self-pay | Admitting: Pharmacist

## 2023-12-24 ENCOUNTER — Other Ambulatory Visit: Payer: Self-pay

## 2023-12-25 ENCOUNTER — Ambulatory Visit: Payer: Self-pay | Admitting: Family Medicine

## 2023-12-25 DIAGNOSIS — G8929 Other chronic pain: Secondary | ICD-10-CM

## 2024-01-23 ENCOUNTER — Other Ambulatory Visit: Payer: Self-pay | Admitting: Family Medicine

## 2024-01-23 DIAGNOSIS — M2550 Pain in unspecified joint: Secondary | ICD-10-CM

## 2024-01-23 NOTE — Telephone Encounter (Unsigned)
 Copied from CRM #[id number]. Topic: Clinical - Medication Refill >> Jan 23, 2024  1:49 PM Burnard DEL wrote: Medication: HYDROcodone -acetaminophen  (NORCO) 10-325 MG tablet  Has the patient contacted their pharmacy? No (Agent: If no, request that the patient contact the pharmacy for the refill. If patient does not wish to contact the pharmacy document the reason why and proceed with request.) (Agent: If yes, when and what did the pharmacy advise?)  This is the patient's preferred pharmacy:  [facility] STORE #[id number] [facility] [place], [place] - [address] AT [facility] [address] [place] [address] Phone: [phone] Fax: [phone]  Is this the correct pharmacy for this prescription? Yes If no, delete pharmacy and type the correct one.   Has the prescription been filled recently? No  Is the patient out of the medication? Yes  Has the patient been seen for an appointment in the last year OR does the patient have an upcoming appointment? Yes  Can we respond through MyChart? NO  Agent: Please be advised that Rx refills may take up to 3 business days. We ask that you follow-up with your pharmacy.

## 2024-01-24 MED ORDER — HYDROCODONE-ACETAMINOPHEN 10-325 MG PO TABS: 1.0000 | ORAL_TABLET | Freq: Three times a day (TID) | ORAL | 0 refills | Status: DC | PRN

## 2024-01-24 NOTE — Telephone Encounter (Signed)
 ERx

## 2024-01-24 NOTE — Telephone Encounter (Signed)
 Name of Medication:  Hydrocodone -APAP Name of Pharmacy:  [facility] Last [place] or Written Date and Quantity:  12/20/23, #40 Last Office Visit and Type:  12/20/23, 3 mo chronic pain mgmt f/u Next Office Visit and Type:  02/19/24, annual exam Last Controlled Substance Agreement Date:  06/15/19 Last UDS:  06/15/19

## 2024-02-14 ENCOUNTER — Telehealth: Payer: Self-pay | Admitting: Family Medicine

## 2024-02-14 DIAGNOSIS — Z1211 Encounter for screening for malignant neoplasm of colon: Secondary | ICD-10-CM

## 2024-02-14 NOTE — Telephone Encounter (Signed)
 New referral placed to Dr Kristie in [place] for rpt colonoscopy as due Plz notify pt.

## 2024-02-14 NOTE — Telephone Encounter (Signed)
 Copied from CRM #[id number]. Topic: Referral - Question >> Feb 14, 2024  2:50 PM Avram MATSU wrote: Reason for CRM: Patient stated he would need a referral to see MD Kristie in [place]. Please advise [phone]  Phone: [phone]

## 2024-02-17 NOTE — Telephone Encounter (Signed)
 Called patient reviewed all information and repeated back to me. Will call if any questions.  ? ?

## 2024-02-19 ENCOUNTER — Ambulatory Visit: Admitting: Family Medicine

## 2024-02-19 ENCOUNTER — Encounter: Payer: Self-pay | Admitting: Family Medicine

## 2024-02-19 VITALS — BP 130/80 | HR 78 | Temp 98.2°F | Ht 73.23 in | Wt 254.4 lb

## 2024-02-19 DIAGNOSIS — I10 Essential (primary) hypertension: Secondary | ICD-10-CM

## 2024-02-19 DIAGNOSIS — Z125 Encounter for screening for malignant neoplasm of prostate: Secondary | ICD-10-CM

## 2024-02-19 DIAGNOSIS — M2550 Pain in unspecified joint: Secondary | ICD-10-CM

## 2024-02-19 DIAGNOSIS — M542 Cervicalgia: Secondary | ICD-10-CM | POA: Diagnosis not present

## 2024-02-19 DIAGNOSIS — G8929 Other chronic pain: Secondary | ICD-10-CM | POA: Diagnosis not present

## 2024-02-19 DIAGNOSIS — K219 Gastro-esophageal reflux disease without esophagitis: Secondary | ICD-10-CM | POA: Diagnosis not present

## 2024-02-19 DIAGNOSIS — M545 Low back pain, unspecified: Secondary | ICD-10-CM | POA: Diagnosis not present

## 2024-02-19 DIAGNOSIS — E785 Hyperlipidemia, unspecified: Secondary | ICD-10-CM | POA: Diagnosis not present

## 2024-02-19 DIAGNOSIS — Z0000 Encounter for general adult medical examination without abnormal findings: Secondary | ICD-10-CM | POA: Diagnosis not present

## 2024-02-19 DIAGNOSIS — R7303 Prediabetes: Secondary | ICD-10-CM

## 2024-02-19 DIAGNOSIS — E66811 Obesity, class 1: Secondary | ICD-10-CM

## 2024-02-19 DIAGNOSIS — R931 Abnormal findings on diagnostic imaging of heart and coronary circulation: Secondary | ICD-10-CM

## 2024-02-19 DIAGNOSIS — Z7189 Other specified counseling: Secondary | ICD-10-CM

## 2024-02-19 DIAGNOSIS — M1A0210 Idiopathic chronic gout, right elbow, without tophus (tophi): Secondary | ICD-10-CM

## 2024-02-19 DIAGNOSIS — F39 Unspecified mood [affective] disorder: Secondary | ICD-10-CM | POA: Diagnosis not present

## 2024-02-19 DIAGNOSIS — J439 Emphysema, unspecified: Secondary | ICD-10-CM

## 2024-02-19 DIAGNOSIS — I7121 Aneurysm of the ascending aorta, without rupture: Secondary | ICD-10-CM

## 2024-02-19 DIAGNOSIS — I2510 Atherosclerotic heart disease of native coronary artery without angina pectoris: Secondary | ICD-10-CM

## 2024-02-19 DIAGNOSIS — Z736 Limitation of activities due to disability: Secondary | ICD-10-CM

## 2024-02-19 LAB — COMPREHENSIVE METABOLIC PANEL WITH GFR
ALT: 26 U/L (ref 3–53)
AST: 19 U/L (ref 5–37)
Albumin: 4.7 g/dL (ref 3.5–5.2)
Alkaline Phosphatase: 66 U/L (ref 39–117)
BUN: 14 mg/dL (ref 6–23)
CO2: 29 meq/L (ref 19–32)
Calcium: 9.4 mg/dL (ref 8.4–10.5)
Chloride: 104 meq/L (ref 96–112)
Creatinine, Ser: 1.06 mg/dL (ref 0.40–1.50)
GFR: 73.33 mL/min
Glucose, Bld: 95 mg/dL (ref 70–99)
Potassium: 3.6 meq/L (ref 3.5–5.1)
Sodium: 140 meq/L (ref 135–145)
Total Bilirubin: 0.9 mg/dL (ref 0.2–1.2)
Total Protein: 7.4 g/dL (ref 6.0–8.3)

## 2024-02-19 LAB — HEMOGLOBIN A1C: Hgb A1c MFr Bld: 6.1 % (ref 4.6–6.5)

## 2024-02-19 LAB — URIC ACID: Uric Acid, Serum: 8.1 mg/dL — ABNORMAL HIGH (ref 4.0–7.8)

## 2024-02-19 LAB — PSA: PSA: 0.99 ng/mL (ref 0.10–4.00)

## 2024-02-19 MED ORDER — AMLODIPINE BESYLATE 5 MG PO TABS: 5.0000 mg | ORAL_TABLET | Freq: Every day | ORAL | 3 refills | Status: AC

## 2024-02-19 MED ORDER — SERTRALINE HCL 25 MG PO TABS: 25.0000 mg | ORAL_TABLET | Freq: Every day | ORAL | 3 refills | Status: AC

## 2024-02-19 MED ORDER — CELECOXIB 200 MG PO CAPS: ORAL_CAPSULE | ORAL | 3 refills | Status: AC

## 2024-02-19 MED ORDER — NORTRIPTYLINE HCL 25 MG PO CAPS: 25.0000 mg | ORAL_CAPSULE | Freq: Every day | ORAL | 3 refills | Status: AC

## 2024-02-19 MED ORDER — HYDROCODONE-ACETAMINOPHEN 10-325 MG PO TABS: 1.0000 | ORAL_TABLET | Freq: Three times a day (TID) | ORAL | 0 refills | Status: AC | PRN

## 2024-02-19 MED ORDER — LOSARTAN POTASSIUM 25 MG PO TABS: 25.0000 mg | ORAL_TABLET | Freq: Every day | ORAL | 3 refills | Status: AC

## 2024-02-19 MED ORDER — OMEPRAZOLE 40 MG PO CPDR: 40.0000 mg | DELAYED_RELEASE_CAPSULE | Freq: Every day | ORAL | 3 refills | Status: AC

## 2024-02-19 MED ORDER — GABAPENTIN 300 MG PO CAPS: ORAL_CAPSULE | ORAL | 3 refills | Status: AC

## 2024-02-19 MED ORDER — ROSUVASTATIN CALCIUM 10 MG PO TABS: 10.0000 mg | ORAL_TABLET | Freq: Every day | ORAL | 3 refills | Status: AC

## 2024-02-19 NOTE — Assessment & Plan Note (Signed)
 Stable period without recent gout flare, managed with PRN colchicine .  Update urate levels.

## 2024-02-19 NOTE — Assessment & Plan Note (Signed)
Chronic, stable on daily PPI.  
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
## 2024-02-19 NOTE — Assessment & Plan Note (Signed)
 Previously discussed To check on notarization status

## 2024-02-19 NOTE — Assessment & Plan Note (Signed)
Asxs from respiratory standpoint.

## 2024-02-19 NOTE — Assessment & Plan Note (Addendum)
 Chronic, stable on repatha  and rosuvastatin . Intolerant to higher statin dose (rash to legs).  The 10-year ASCVD risk score (Arnett DK, et al., 2019) is: 13.6%   Values used to calculate the score:     Age: 69 years     Clinically relevant sex: Male     Is Non-Hispanic African American: Yes     Diabetic: No     Tobacco smoker: No     Systolic Blood Pressure: 130 mmHg     Is BP treated: Yes     HDL Cholesterol: 74 mg/dL     Total Cholesterol: 130 mg/dL

## 2024-02-19 NOTE — Patient Instructions (Addendum)
 Labs today  You will be due for colonoscopy with Dr Kristie in 04/2024. [phone]  Check if your advanced directives at home have been notarized, bring us  updated copy  You are doing well today Return as needed or in 3 months for chronic pain follow up visit   Rodney Charles,  Thank you for taking the time for your Medicare Wellness Visit. I appreciate your continued commitment to your health goals. Please review the care plan we discussed, and feel free to reach out if I can assist you further.  Please note that Annual Wellness Visits do not include a physical exam. Some assessments may be limited, especially if the visit was conducted virtually. If needed, we may recommend an in-person follow-up with your provider.  Ongoing Care Seeing your primary care provider every 3 to 6 months helps us  monitor your health and provide consistent, personalized care.   Referrals If a referral was made during today's visit and you haven't received any updates within two weeks, please contact the referred provider directly to check on the status.  Recommended Screenings:  Health Maintenance  Topic Date Due   Medicare Annual Wellness Visit  01/24/2024   Colon Cancer Screening  04/26/2024   COVID-19 Vaccine (5 - 2025-26 season) 06/23/2024   DTaP/Tdap/Td vaccine (3 - Td or Tdap) 12/24/2033   Pneumococcal Vaccine for age over 74  Completed   Flu Shot  Completed   Hepatitis C Screening  Completed   Zoster (Shingles) Vaccine  Completed   Meningitis B Vaccine  Aged Out       01/24/2023    9:04 AM  Advanced Directives  Does Patient Have a Medical Advance Directive? Yes  Type of Estate Agent of [place];Living will  Copy of Healthcare Power of Attorney in Chart? No - copy requested    Vision: Annual vision screenings are recommended for early detection of glaucoma, cataracts, and diabetic retinopathy. These exams can also reveal signs of chronic conditions such as diabetes and  high blood pressure.  Dental: Annual dental screenings help detect early signs of oral cancer, gum disease, and other conditions linked to overall health, including heart disease and diabetes.  Please see the attached documents for additional preventive care recommendations.

## 2024-02-19 NOTE — Assessment & Plan Note (Signed)
 Preventative protocols reviewed and updated unless pt declined. Discussed healthy diet and lifestyle.

## 2024-02-19 NOTE — Assessment & Plan Note (Addendum)
 Depression > anxiety.  Chronic, stable period on low dose sertraline , declines med change.

## 2024-02-19 NOTE — Assessment & Plan Note (Addendum)
 Appreciate cardiology care Now on aspirin , statin, [id number] (Repatha )

## 2024-02-19 NOTE — Assessment & Plan Note (Signed)
 Update A1c. Reviewed limiting added sugar in the diet.

## 2024-02-19 NOTE — Assessment & Plan Note (Signed)
 Chronic, stable on current regimen - continue amlodipine  and losartan  25mg  daily.

## 2024-02-19 NOTE — Assessment & Plan Note (Signed)
 See above

## 2024-02-19 NOTE — Assessment & Plan Note (Signed)
 [place] CSRS reviewed  Stable period on current regimen.

## 2024-02-19 NOTE — Assessment & Plan Note (Signed)
 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 Consider rpt 07/2024.

## 2024-02-19 NOTE — Assessment & Plan Note (Signed)
 Encourage healthy diet and lifestyle choices to effect sustainable weight loss.

## 2024-02-24 ENCOUNTER — Ambulatory Visit: Payer: Self-pay | Admitting: Family Medicine

## 2024-02-24 NOTE — Telephone Encounter (Signed)
 Called patient reviewed all information and repeated back to me. Pt states no issues at present moment but states that if he has another gout flare pop up that he will call our office to let us  know.

## 2024-05-19 ENCOUNTER — Ambulatory Visit: Admitting: Family Medicine
